# Patient Record
Sex: Female | Born: 1960 | Race: White | Hispanic: No | Smoking: Former smoker
Health system: Southern US, Community
[De-identification: ages and names within clinical notes are randomized; demographics above are authoritative.]

## PROBLEM LIST (undated history)

## (undated) DIAGNOSIS — I10 Essential (primary) hypertension: Secondary | ICD-10-CM

## (undated) DIAGNOSIS — F410 Panic disorder [episodic paroxysmal anxiety] without agoraphobia: Secondary | ICD-10-CM

## (undated) DIAGNOSIS — F419 Anxiety disorder, unspecified: Secondary | ICD-10-CM

## (undated) DIAGNOSIS — I2109 ST elevation (STEMI) myocardial infarction involving other coronary artery of anterior wall: Secondary | ICD-10-CM

## (undated) DIAGNOSIS — Z8673 Personal history of transient ischemic attack (TIA), and cerebral infarction without residual deficits: Secondary | ICD-10-CM

## (undated) DIAGNOSIS — M1990 Unspecified osteoarthritis, unspecified site: Secondary | ICD-10-CM

## (undated) DIAGNOSIS — I429 Cardiomyopathy, unspecified: Secondary | ICD-10-CM

## (undated) DIAGNOSIS — G43909 Migraine, unspecified, not intractable, without status migrainosus: Secondary | ICD-10-CM

## (undated) DIAGNOSIS — J449 Chronic obstructive pulmonary disease, unspecified: Secondary | ICD-10-CM

## (undated) DIAGNOSIS — I099 Rheumatic heart disease, unspecified: Secondary | ICD-10-CM

## (undated) DIAGNOSIS — G8929 Other chronic pain: Secondary | ICD-10-CM

## (undated) DIAGNOSIS — J45909 Unspecified asthma, uncomplicated: Secondary | ICD-10-CM

## (undated) DIAGNOSIS — M797 Fibromyalgia: Secondary | ICD-10-CM

## (undated) DIAGNOSIS — E785 Hyperlipidemia, unspecified: Secondary | ICD-10-CM

## (undated) DIAGNOSIS — Q059 Spina bifida, unspecified: Secondary | ICD-10-CM

## (undated) DIAGNOSIS — M549 Dorsalgia, unspecified: Secondary | ICD-10-CM

## (undated) HISTORY — DX: Hyperlipidemia, unspecified: E78.5

## (undated) HISTORY — DX: Essential (primary) hypertension: I10

## (undated) HISTORY — PX: CARDIAC CATHETERIZATION: SHX172

## (undated) HISTORY — DX: Rheumatic heart disease, unspecified: I09.9

## (undated) HISTORY — DX: ST elevation (STEMI) myocardial infarction involving other coronary artery of anterior wall: I21.09

## (undated) HISTORY — DX: Chronic obstructive pulmonary disease, unspecified: J44.9

## (undated) HISTORY — PX: APPENDECTOMY: SHX54

## (undated) HISTORY — DX: Personal history of transient ischemic attack (TIA), and cerebral infarction without residual deficits: Z86.73

## (undated) HISTORY — DX: Cardiomyopathy, unspecified: I42.9

## (undated) HISTORY — PX: TUBAL LIGATION: SHX77

---

## 2004-03-22 ENCOUNTER — Emergency Department (HOSPITAL_COMMUNITY): Admission: EM | Admit: 2004-03-22 | Discharge: 2004-03-22 | Payer: Self-pay | Admitting: Emergency Medicine

## 2005-07-01 ENCOUNTER — Ambulatory Visit: Payer: Self-pay | Admitting: Cardiology

## 2005-07-08 ENCOUNTER — Ambulatory Visit: Payer: Self-pay | Admitting: Cardiology

## 2005-07-09 ENCOUNTER — Inpatient Hospital Stay (HOSPITAL_BASED_OUTPATIENT_CLINIC_OR_DEPARTMENT_OTHER): Admission: RE | Admit: 2005-07-09 | Discharge: 2005-07-09 | Payer: Self-pay | Admitting: Cardiovascular Disease

## 2005-07-09 ENCOUNTER — Ambulatory Visit: Payer: Self-pay | Admitting: Cardiovascular Disease

## 2005-09-08 ENCOUNTER — Ambulatory Visit: Payer: Self-pay | Admitting: Cardiology

## 2005-12-05 ENCOUNTER — Ambulatory Visit: Payer: Self-pay | Admitting: Cardiology

## 2005-12-08 ENCOUNTER — Encounter: Payer: Self-pay | Admitting: Cardiology

## 2005-12-17 ENCOUNTER — Ambulatory Visit: Payer: Self-pay | Admitting: Cardiology

## 2005-12-23 ENCOUNTER — Encounter: Payer: Self-pay | Admitting: Cardiology

## 2005-12-23 ENCOUNTER — Ambulatory Visit: Payer: Self-pay | Admitting: Internal Medicine

## 2005-12-23 ENCOUNTER — Inpatient Hospital Stay (HOSPITAL_BASED_OUTPATIENT_CLINIC_OR_DEPARTMENT_OTHER): Admission: RE | Admit: 2005-12-23 | Discharge: 2005-12-23 | Payer: Self-pay | Admitting: Internal Medicine

## 2006-01-29 ENCOUNTER — Ambulatory Visit: Payer: Self-pay | Admitting: Cardiology

## 2006-02-09 ENCOUNTER — Ambulatory Visit: Payer: Self-pay | Admitting: Dentistry

## 2006-02-09 ENCOUNTER — Encounter: Admission: RE | Admit: 2006-02-09 | Discharge: 2006-02-09 | Payer: Self-pay | Admitting: Dentistry

## 2006-02-22 HISTORY — PX: MITRAL VALVE REPLACEMENT: SHX147

## 2006-02-26 ENCOUNTER — Ambulatory Visit (HOSPITAL_COMMUNITY): Admission: RE | Admit: 2006-02-26 | Discharge: 2006-02-26 | Payer: Self-pay | Admitting: Dentistry

## 2006-03-12 ENCOUNTER — Ambulatory Visit: Payer: Self-pay | Admitting: Dentistry

## 2006-03-16 ENCOUNTER — Encounter
Admission: RE | Admit: 2006-03-16 | Discharge: 2006-03-16 | Payer: Self-pay | Admitting: Thoracic Surgery (Cardiothoracic Vascular Surgery)

## 2006-03-18 ENCOUNTER — Encounter: Payer: Self-pay | Admitting: Physician Assistant

## 2006-03-18 ENCOUNTER — Inpatient Hospital Stay (HOSPITAL_COMMUNITY)
Admission: RE | Admit: 2006-03-18 | Discharge: 2006-03-26 | Payer: Self-pay | Admitting: Thoracic Surgery (Cardiothoracic Vascular Surgery)

## 2006-03-18 ENCOUNTER — Encounter (INDEPENDENT_AMBULATORY_CARE_PROVIDER_SITE_OTHER): Payer: Self-pay | Admitting: *Deleted

## 2006-03-21 IMAGING — CR DG CHEST 2V
2 series · 2 of 2 positions shown · non-contrast
Comparison: [DATE].

CLINICAL DATA: CABG.  
 CHEST ? 2 VIEW:

[w chest pa]
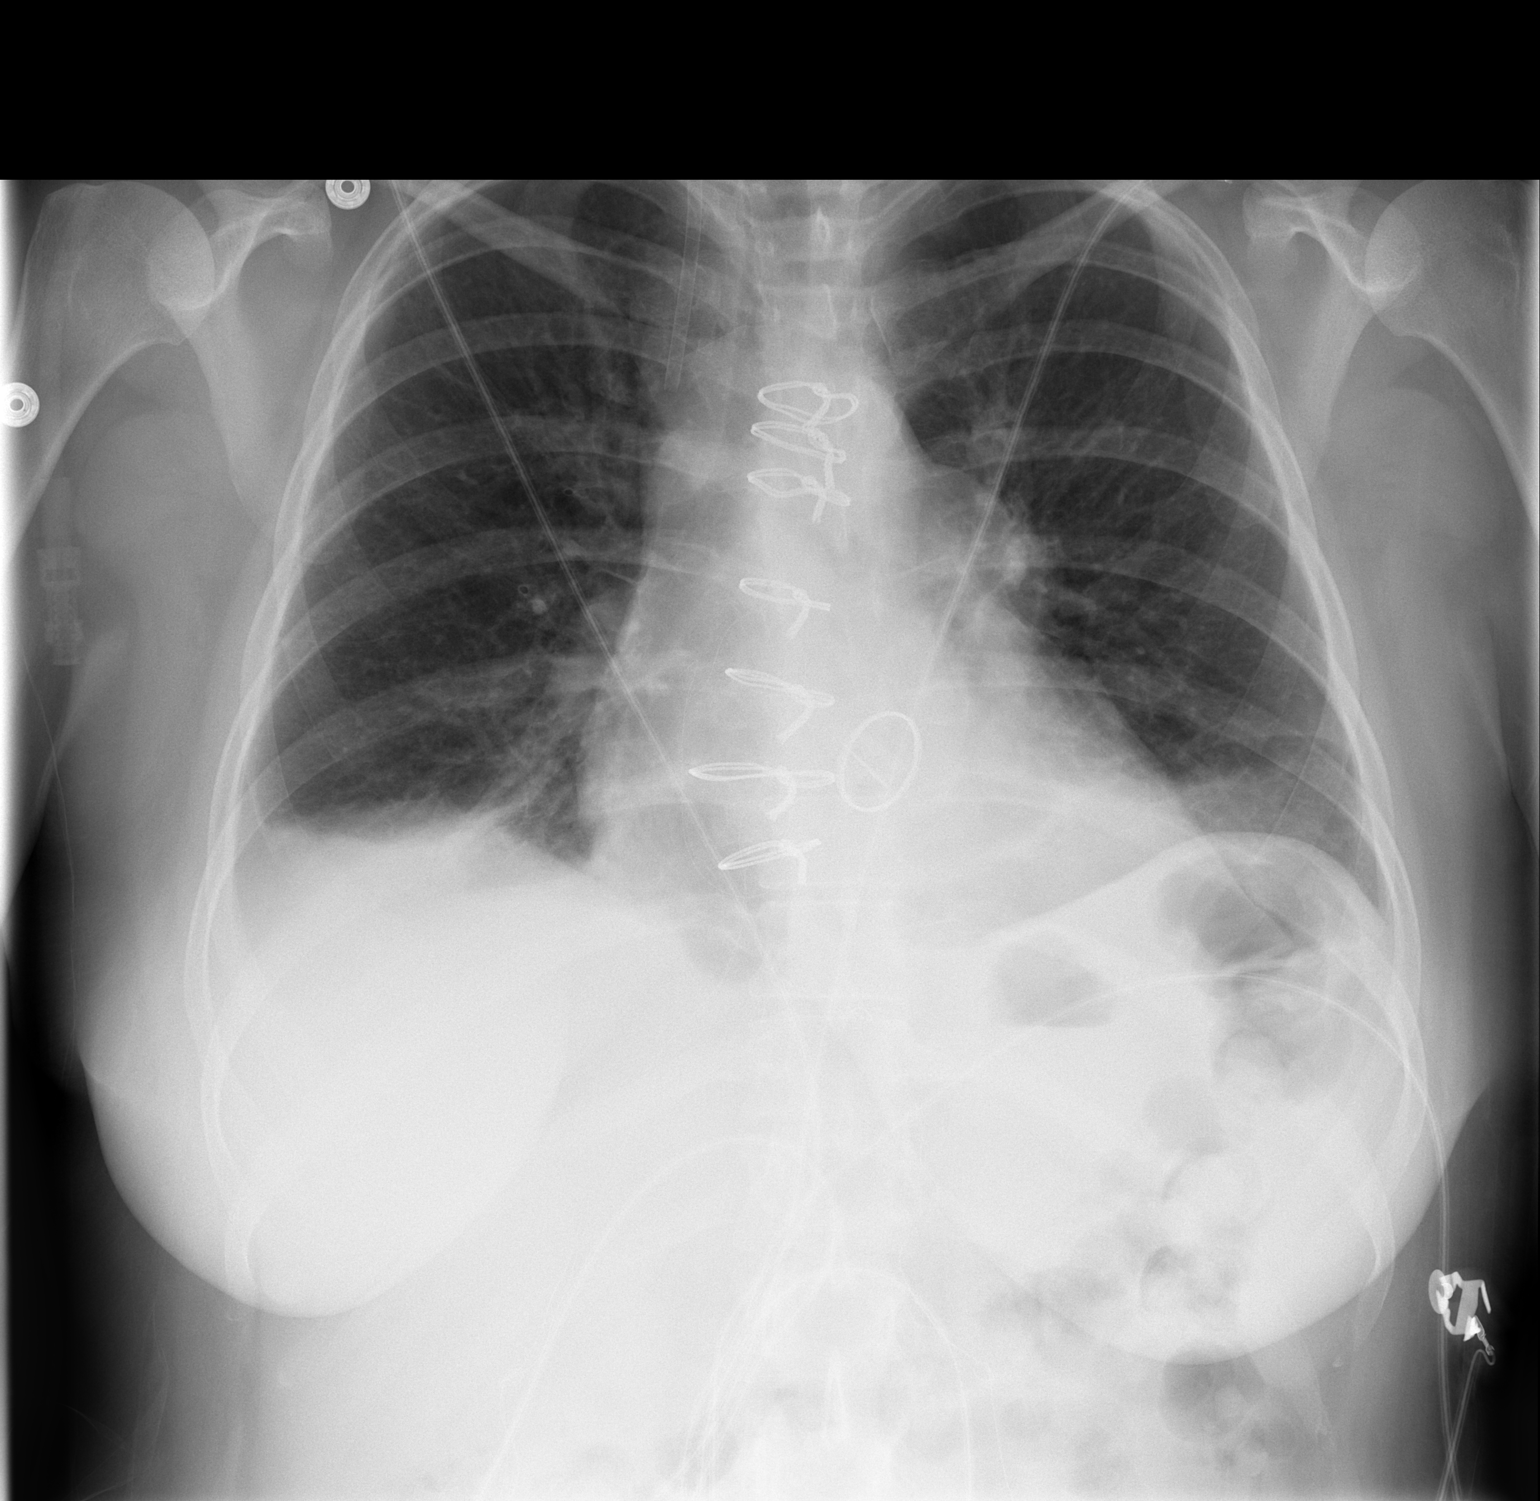

[w chest lat]
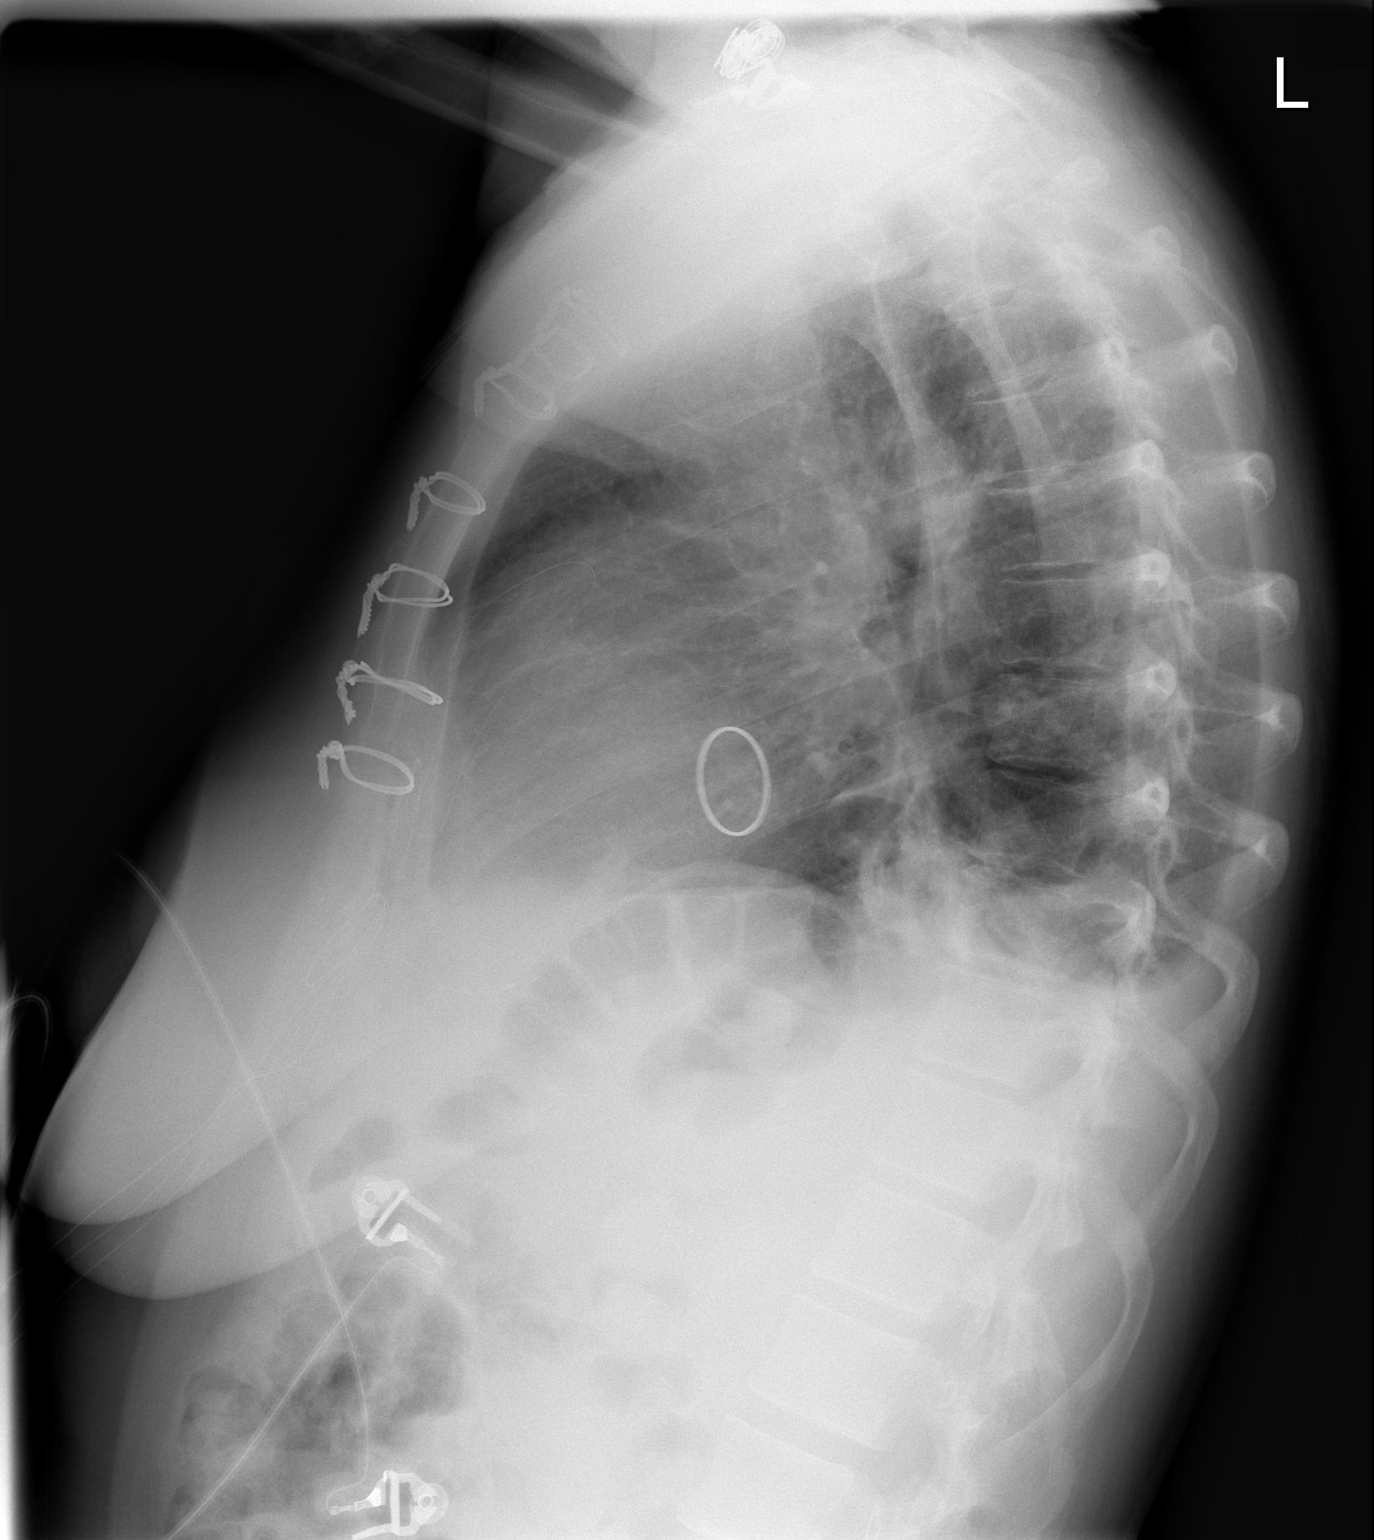

[2 of 2 positions shown; findings below may reference images not displayed]

FINDINGS: Left-sided chest tube or drain has been removed in the interval.  There is no evidence for pneumothorax.  Right IJ sheath remains in place.  Heart size is stable.  Decreased left base atelectasis with increased atelectasis at the right base.  Small bilateral pleural effusions.
IMPRESSION: Basilar atelectasis, improved on the left.  Small bilateral pleural effusions.

## 2006-04-02 ENCOUNTER — Ambulatory Visit: Payer: Self-pay | Admitting: Cardiology

## 2006-04-09 ENCOUNTER — Ambulatory Visit: Payer: Self-pay | Admitting: Cardiology

## 2006-04-13 ENCOUNTER — Encounter
Admission: RE | Admit: 2006-04-13 | Discharge: 2006-04-13 | Payer: Self-pay | Admitting: Thoracic Surgery (Cardiothoracic Vascular Surgery)

## 2006-05-21 ENCOUNTER — Ambulatory Visit: Payer: Self-pay | Admitting: Cardiology

## 2006-08-05 ENCOUNTER — Ambulatory Visit: Payer: Self-pay | Admitting: Dentistry

## 2006-08-19 ENCOUNTER — Ambulatory Visit: Payer: Self-pay | Admitting: Cardiology

## 2006-09-15 ENCOUNTER — Ambulatory Visit: Payer: Self-pay | Admitting: Dentistry

## 2007-01-29 ENCOUNTER — Ambulatory Visit: Payer: Self-pay | Admitting: Cardiology

## 2007-04-28 ENCOUNTER — Encounter: Payer: Self-pay | Admitting: Physician Assistant

## 2007-04-28 ENCOUNTER — Ambulatory Visit: Payer: Self-pay | Admitting: Cardiology

## 2007-04-29 ENCOUNTER — Encounter: Payer: Self-pay | Admitting: Cardiology

## 2007-05-30 ENCOUNTER — Encounter: Payer: Self-pay | Admitting: Physician Assistant

## 2007-08-29 ENCOUNTER — Ambulatory Visit: Payer: Self-pay | Admitting: Cardiology

## 2008-07-03 ENCOUNTER — Encounter
Admission: RE | Admit: 2008-07-03 | Discharge: 2008-07-03 | Payer: Self-pay | Admitting: Physical Medicine & Rehabilitation

## 2009-05-03 ENCOUNTER — Telehealth: Payer: Self-pay | Admitting: Cardiology

## 2009-11-24 DIAGNOSIS — I2109 ST elevation (STEMI) myocardial infarction involving other coronary artery of anterior wall: Secondary | ICD-10-CM

## 2009-11-24 HISTORY — DX: ST elevation (STEMI) myocardial infarction involving other coronary artery of anterior wall: I21.09

## 2010-06-24 HISTORY — PX: CORONARY ANGIOPLASTY WITH STENT PLACEMENT: SHX49

## 2010-07-04 ENCOUNTER — Ambulatory Visit: Payer: Self-pay | Admitting: Internal Medicine

## 2010-07-04 ENCOUNTER — Encounter: Payer: Self-pay | Admitting: Physician Assistant

## 2010-07-04 ENCOUNTER — Inpatient Hospital Stay (HOSPITAL_COMMUNITY): Admission: AD | Admit: 2010-07-04 | Discharge: 2010-07-10 | Payer: Self-pay | Admitting: Cardiology

## 2010-07-05 ENCOUNTER — Encounter: Payer: Self-pay | Admitting: Physician Assistant

## 2010-07-05 ENCOUNTER — Encounter (INDEPENDENT_AMBULATORY_CARE_PROVIDER_SITE_OTHER): Payer: Self-pay | Admitting: Internal Medicine

## 2010-07-06 ENCOUNTER — Encounter: Payer: Self-pay | Admitting: Physician Assistant

## 2010-07-07 ENCOUNTER — Encounter: Payer: Self-pay | Admitting: Physician Assistant

## 2010-07-10 ENCOUNTER — Encounter: Payer: Self-pay | Admitting: Physician Assistant

## 2010-07-12 ENCOUNTER — Encounter: Payer: Self-pay | Admitting: Cardiology

## 2010-07-12 ENCOUNTER — Telehealth (INDEPENDENT_AMBULATORY_CARE_PROVIDER_SITE_OTHER): Payer: Self-pay | Admitting: *Deleted

## 2010-07-24 ENCOUNTER — Encounter: Payer: Self-pay | Admitting: Physician Assistant

## 2010-07-24 ENCOUNTER — Encounter: Payer: Self-pay | Admitting: Cardiology

## 2010-08-27 ENCOUNTER — Ambulatory Visit: Payer: Self-pay | Admitting: Physician Assistant

## 2010-08-27 DIAGNOSIS — I5022 Chronic systolic (congestive) heart failure: Secondary | ICD-10-CM

## 2010-08-27 DIAGNOSIS — Z9889 Other specified postprocedural states: Secondary | ICD-10-CM | POA: Insufficient documentation

## 2010-08-27 DIAGNOSIS — E782 Mixed hyperlipidemia: Secondary | ICD-10-CM | POA: Insufficient documentation

## 2010-08-27 DIAGNOSIS — I2510 Atherosclerotic heart disease of native coronary artery without angina pectoris: Secondary | ICD-10-CM | POA: Insufficient documentation

## 2010-10-02 ENCOUNTER — Encounter (INDEPENDENT_AMBULATORY_CARE_PROVIDER_SITE_OTHER): Payer: Self-pay | Admitting: *Deleted

## 2010-10-08 ENCOUNTER — Encounter: Payer: Self-pay | Admitting: Cardiology

## 2010-10-24 ENCOUNTER — Encounter: Payer: Self-pay | Admitting: Physician Assistant

## 2010-10-29 ENCOUNTER — Encounter: Payer: Self-pay | Admitting: Physician Assistant

## 2010-11-01 ENCOUNTER — Encounter: Payer: Self-pay | Admitting: Physician Assistant

## 2010-12-15 ENCOUNTER — Encounter: Payer: Self-pay | Admitting: Neurosurgery

## 2010-12-24 NOTE — Letter (Signed)
Summary: Certified Letter for Pending Personal assistant at [facility]  [address]   [place], [place] [address]   Phone: [phone]  Fax: [phone]        October 02, 2010 MRN: [id number]    [facility] [address] [place], [place]  [address]    Dear Ms. Belair,     You are scheduled for an echocardiogram on October 6th. You also had lab work ordered following your office visit on October 4th. We mailed you a letter on October 21st asking you to contact the office to reschedule your echocardiogram and to remind you to have lab work done. However, it does not appear the echo or labs have been done at this time.   Please contact our office as soon as possible to reschedule the echocardiogram. You may go to the [facility] for lab work or do this at the hospital on the same day as your echo when you get it rescheduled.   If you will not be able to do the echocardiogram or lab work at this time, please notify our office so that we can properly document this in  your chart.        Sincerely,  Cyril Loosen, RN, BSN  This letter has been electronically signed by your physician.

## 2010-12-24 NOTE — Letter (Signed)
Summary: Appointment -missed  [facility] [address]   [place], [place] [address]   Phone: [phone]  Fax: [phone]     July 24, 2010 MRN: [id number]     [facility] [address] [place], [place]  [address]     Dear Ms. Daphine Deutscher,  Our records indicate you missed your appointment on July 24, 2010                        with Dr. Andee Lineman.   It is very important that we reach you to reschedule this appointment. We look forward to participating in your health care needs.   Please contact us at the number listed above at your earliest convenience to reschedule this appointment.   Sincerely,    Glass blower/designer

## 2010-12-24 NOTE — Medication Information (Signed)
Summary: RX Folder/ CRESTOR PRIOR AUTHORIZATION  RX Folder/ CRESTOR PRIOR AUTHORIZATION   Imported By: Dorise Hiss 10/08/2010 14:18:41  _____________________________________________________________________  External Attachment:    Type:   Image     Comment:   External Document

## 2010-12-24 NOTE — Assessment & Plan Note (Signed)
Summary: EPH-POST CONE NO SHOWED FOR LAST APPT   Visit Type:  Follow-up Primary Provider:  Dhruv Vyas,MD   History of Present Illness: patient presents for post hospital followup.  She presented with acute anterior ST elevation MI, on August 11, at [facility], and was discharged on 8/17. She was found to have 100% mid LAD, successfully treated with BMS, but also suffered catheter-induced left mainstem dissection, also treated with BMS. She had residual nonobstructive CFX and RCA disease. LV function was severely depressed at (25-30%), with apical akinesis.  She also required treatment for acute systolic heart failure, and was discharged on furosemide and Aldactone. She has not had any post hospital followup labs, for monitoring of electrolytes and renal function. She was placed back on Coumadin, for history of St Jude prosthetic MVR, and is followed by Dr. Sherril Croon.  Of note, patient was discharged with a LifeVest, but does not wear this continuously. Plan was for a followup 2-D echo, 40 days post discharge, for reassessment of LVF.  She denies any recurrent chest pain, significant dyspnea, orthopnea, PND, lower extremity edema, tachypalpitations, or presyncope/syncope. She reports compliance with all her medications. Unfortunately, however, she continues to smoke.  Preventive Screening-Counseling & Management  Alcohol-Tobacco     Smoking Status: current     Smoking Cessation Counseling: yes     Packs/Day: <1/2 PPD  Current Medications (verified): 1)  Aspir-Low 81 Mg Tbec (Aspirin) .... Take 1 Tablet By Mouth Once A Day 2)  Carvedilol 6.25 Mg Tabs (Carvedilol) .... Take 1 Tablet By Mouth Two Times A Day 3)  Plavix 75 Mg Tabs (Clopidogrel Bisulfate) .... Take 1 Tablet By Mouth Once A Day 4)  Digoxin 0.125 Mg Tabs (Digoxin) .... Take 1 Tablet By Mouth Once A Day 5)  Enalapril Maleate 2.5 Mg Tabs (Enalapril Maleate) .... Take 1 Tablet By Mouth Two Times A Day 6)  Furosemide 40 Mg  Tabs (Furosemide) .... Take 1 Tablet By Mouth Once A Day 7)  Nitrostat 0.4 Mg Subl (Nitroglycerin) .... Use As Directed For Chest Pain As Needed 8)  Crestor 40 Mg Tabs (Rosuvastatin Calcium) .... Take 1 Tablet By Mouth Once A Day 9)  Spironolactone 25 Mg Tabs (Spironolactone) .... Take 1 Tablet By Mouth Once A Day 10)  Warfarin Sodium 5 Mg Tabs (Warfarin Sodium) .... Use As Directed Per Vyas Office 11)  Hydrocodone-Acetaminophen 5-500 Mg Tabs (Hydrocodone-Acetaminophen) .... Take 1 Tablet By Mouth Two Times A Day As Needed 12)  Alprazolam 0.5 Mg Tabs (Alprazolam) .... Take 1 Tablet By Mouth Two Times A Day As Needed  Allergies (verified): 1)  ! Flexeril  Comments:  Nurse/Medical Assistant: The patient's medication bottles and allergies were reviewed with the patient and were updated in the Medication and Allergy Lists.  Past History:  Past Medical History: Last updated: 07/24/2010  1. Rheumatic heart disease, status post St. Jude mitral valve in 2007.   2. Minimal nonobstructive coronary disease by cardiac catheterization       prior to her surgery in 2007, ejection fraction of 55% at that       time.   3. COPD.   4. Hypertension.   5. History of congestive heart failure.     Social History: Smoking Status:  current Packs/Day:  <1/2 PPD  Review of Systems       No fevers, chills, hemoptysis, dysphagia, melena, hematocheezia, hematuria, rash, claudication, orthopnea, pnd, pedal edema. All other systems negative.   Vital Signs:  Patient profile:  50 year old female Height:      64 inches Weight:      196 pounds BMI:     33.76 Pulse rate:   67 / minute BP sitting:   97 / 64  (left arm) Cuff size:   large  Vitals Entered By: Carlye Grippe (August 27, 2010 10:46 AM)  Nutrition Counseling: Patient's BMI is greater than 25 and therefore counseled on weight management options.  Physical Exam  Additional Exam:  GEN: 49 -year-oldl female, sitting up right, no  distress HEENT: NCAT,PERRLA,EOMI NECK: palpable pulses, no bruits; no JVD; no TM LUNGS: CTA bilaterally HEART: RRR (S1S2); no significant murmurs; crisp S1 click ABD: soft, NT; intact BS EXT: intact distal pulses; no edema SKIN: warm, dry MUSC: no obvious deformity NEURO: A/O (x3)     Impression & Recommendations:  Problem # 1:  CAD (ICD-414.00)  Will schedule 2-D echo for reassessment of severe LVD (25-30%). If this remains less than 35%, we'll refer her to our EP team for consideration of ICD placement. Patient is otherwise advised to continue wearing Lifevest, in the interim.  Problem # 2:  CHRONIC SYSTOLIC HEART FAILURE (ICD-428.22)  appears euvolemic by history and presentation. We'll check metabolic profile for monitoring of electrolytes renal function, as well as CBC.  Problem # 3:  MITRAL VALVE REPLACEMENT, HX OF (ICD-V15.1)  patient is on chronic Coumadin, followed by Dr. Sherril Croon.  Problem # 4:  HYPERLIPIDEMIA, MIXED (ICD-272.2)  aggressive lipid management recommended, with target LDL 70 or less, if feasible.  Other Orders: T-Basic Metabolic Panel [phone]) T-CBC No Diff ([id number]) 2-D Echocardiogram (2D Echo)  Patient Instructions: 1)  Your physician wants you to follow-up in: 3 months. You will receive a reminder letter in the mail one-two months in advance. If you don't receive a letter, please call our office to schedule the follow-up appointment. 2)  Your physician recommends that you go to the [facility] for lab work. 3)  Your physician has requested that you have an echocardiogram.  Echocardiography is a painless test that uses sound waves to create images of your heart. It provides your doctor with information about the size and shape of your heart and how well your heart's chambers and valves are working.  This procedure takes approximately one hour. There are no restrictions for this procedure. 4)  No added salt diet.

## 2010-12-24 NOTE — Progress Notes (Signed)
Summary: crestor pre-authorization   Phone Note Other Incoming   Caller: fax from [facility] for prior authorization Summary of Call: Requesting that we do prior authorization on this patient for Crestor.  We have not seen her in this office since '08, but you are seeing her on 8/31 for post hosp.  Recently had cath on 8/17 & was started by Dr. Sanjuana Kava.  Patient has Medicaid, so this med requires pre-auth.  Do you want to change to different statin?  [facility] aware sending message to PA.   Hoover Brunette, LPN  July 12, 2010 10:58 AM   Follow-up for Phone Call        no, continue Crestor Follow-up by: Nelida Meuse, PA-C,  July 17, 2010 1:12 PM

## 2010-12-24 NOTE — Letter (Signed)
Summary: Letter/ REORDER LIFEVEST  Letter/ REORDER LIFEVEST   Imported By: Dorise Hiss 10/09/2010 09:23:46  _____________________________________________________________________  External Attachment:    Type:   Image     Comment:   External Document

## 2010-12-24 NOTE — Op Note (Signed)
Summary: Operative Report  Operative Report   Imported By: Dorise Hiss 07/24/2010 12:13:15  _____________________________________________________________________  External Attachment:    Type:   Image     Comment:   External Document

## 2010-12-24 NOTE — Cardiovascular Report (Signed)
Summary: Cardiac Catheterization  Cardiac Catheterization   Imported By: Dorise Hiss 07/22/2010 12:40:38  _____________________________________________________________________  External Attachment:    Type:   Image     Comment:   External Document

## 2010-12-24 NOTE — Letter (Signed)
Summary: Appointment -missed  [facility] [address]   [place], [place] [address]   Phone: [phone]  Fax: [phone]     July 12, 2010 MRN: [id number]     [facility] [address] [place], [place]  [address]     Dear Ms. Daphine Deutscher,  Our records indicate you missed your appointment on July 12, 2010                        with [facility].   It is very important that we reach you to reschedule this appointment. We look forward to participating in your health care needs.   Please contact us at the number listed above at your earliest convenience to reschedule this appointment.   Sincerely,    Glass blower/designer

## 2010-12-24 NOTE — Miscellaneous (Signed)
Summary: Orders Update  Clinical Lists Changes  Orders: Added new Referral order of 2-D Echocardiogram (2D Echo) - Signed 

## 2010-12-24 NOTE — Medication Information (Signed)
Summary: RX Folder/ CRESTOR DISCUSS AT OV  RX Folder/ CRESTOR DISCUSS AT OV   Imported By: Dorise Hiss 07/24/2010 12:03:13  _____________________________________________________________________  External Attachment:    Type:   Image     Comment:   External Document

## 2010-12-26 NOTE — Letter (Signed)
Summary: Certified Letter Response  Certified Letter Response   Imported By: Cyril Loosen, RN, BSN 11/22/2010 09:21:50  _____________________________________________________________________  External Attachment:    Type:   Image     Comment:   External Document

## 2011-02-07 LAB — CBC
HCT: 38.4 % (ref 36.0–46.0)
HCT: 40.8 % (ref 36.0–46.0)
HCT: 41.3 % (ref 36.0–46.0)
Hemoglobin: 12.8 g/dL (ref 12.0–15.0)
Hemoglobin: 13.1 g/dL (ref 12.0–15.0)
Hemoglobin: 13.7 g/dL (ref 12.0–15.0)
MCH: 34.2 pg — ABNORMAL HIGH (ref 26.0–34.0)
MCHC: 33.6 g/dL (ref 30.0–36.0)
MCHC: 33.8 g/dL (ref 30.0–36.0)
MCHC: 33.9 g/dL (ref 30.0–36.0)
MCV: 100.5 fL — ABNORMAL HIGH (ref 78.0–100.0)
MCV: 99.5 fL (ref 78.0–100.0)
MCV: 99.7 fL (ref 78.0–100.0)
Platelets: 236 10*3/uL (ref 150–400)
Platelets: 257 10*3/uL (ref 150–400)
RBC: 3.85 MIL/uL — ABNORMAL LOW (ref 3.87–5.11)
RBC: 4.15 MIL/uL (ref 3.87–5.11)
RBC: 4.18 MIL/uL (ref 3.87–5.11)
RDW: 13.7 % (ref 11.5–15.5)
WBC: 12.1 10*3/uL — ABNORMAL HIGH (ref 4.0–10.5)
WBC: 12.2 10*3/uL — ABNORMAL HIGH (ref 4.0–10.5)
WBC: 14.1 10*3/uL — ABNORMAL HIGH (ref 4.0–10.5)

## 2011-02-07 LAB — BASIC METABOLIC PANEL
BUN: 16 mg/dL (ref 6–23)
BUN: 19 mg/dL (ref 6–23)
BUN: 8 mg/dL (ref 6–23)
BUN: 9 mg/dL (ref 6–23)
CO2: 25 mEq/L (ref 19–32)
CO2: 26 mEq/L (ref 19–32)
CO2: 30 mEq/L (ref 19–32)
Calcium: 8.1 mg/dL — ABNORMAL LOW (ref 8.4–10.5)
Calcium: 8.4 mg/dL (ref 8.4–10.5)
Calcium: 8.8 mg/dL (ref 8.4–10.5)
Chloride: 105 mEq/L (ref 96–112)
Chloride: 94 mEq/L — ABNORMAL LOW (ref 96–112)
Chloride: 96 mEq/L (ref 96–112)
Chloride: 98 mEq/L (ref 96–112)
Creatinine, Ser: 0.83 mg/dL (ref 0.4–1.2)
Creatinine, Ser: 0.96 mg/dL (ref 0.4–1.2)
Creatinine, Ser: 1.19 mg/dL (ref 0.4–1.2)
GFR calc Af Amer: 59 mL/min — ABNORMAL LOW (ref >60)
GFR calc Af Amer: >60 mL/min (ref >60)
GFR calc Af Amer: >60 mL/min (ref >60)
GFR calc non Af Amer: 48 mL/min — ABNORMAL LOW (ref >60)
GFR calc non Af Amer: 49 mL/min — ABNORMAL LOW (ref >60)
GFR calc non Af Amer: >60 mL/min (ref >60)
GFR calc non Af Amer: >60 mL/min (ref >60)
GFR calc non Af Amer: >60 mL/min (ref >60)
Glucose, Bld: 111 mg/dL — ABNORMAL HIGH (ref 70–99)
Glucose, Bld: 124 mg/dL — ABNORMAL HIGH (ref 70–99)
Glucose, Bld: 127 mg/dL — ABNORMAL HIGH (ref 70–99)
Glucose, Bld: 138 mg/dL — ABNORMAL HIGH (ref 70–99)
Potassium: 3 mEq/L — ABNORMAL LOW (ref 3.5–5.1)
Potassium: 3.8 mEq/L (ref 3.5–5.1)
Potassium: 3.8 mEq/L (ref 3.5–5.1)
Potassium: 4 mEq/L (ref 3.5–5.1)
Potassium: 4.4 mEq/L (ref 3.5–5.1)
Sodium: 137 mEq/L (ref 135–145)
Sodium: 137 mEq/L (ref 135–145)
Sodium: 138 mEq/L (ref 135–145)
Sodium: 138 mEq/L (ref 135–145)
Sodium: 139 mEq/L (ref 135–145)

## 2011-02-07 LAB — COMPREHENSIVE METABOLIC PANEL
ALT: 24 U/L (ref 0–35)
Alkaline Phosphatase: 53 U/L (ref 39–117)
BUN: 17 mg/dL (ref 6–23)
CO2: 30 mEq/L (ref 19–32)
Chloride: 102 mEq/L (ref 96–112)
GFR calc non Af Amer: 57 mL/min — ABNORMAL LOW (ref >60)
Glucose, Bld: 107 mg/dL — ABNORMAL HIGH (ref 70–99)
Potassium: 4.4 mEq/L (ref 3.5–5.1)
Sodium: 139 mEq/L (ref 135–145)
Total Bilirubin: 0.4 mg/dL (ref 0.3–1.2)
Total Protein: 6.8 g/dL (ref 6.0–8.3)

## 2011-02-07 LAB — LIPID PANEL
Cholesterol: 269 mg/dL — ABNORMAL HIGH (ref 0–200)
HDL: 39 mg/dL — ABNORMAL LOW (ref >39)
LDL Cholesterol: 181 mg/dL — ABNORMAL HIGH (ref 0–99)
Total CHOL/HDL Ratio: 6.9 RATIO
VLDL: 49 mg/dL — ABNORMAL HIGH (ref 0–40)

## 2011-02-07 LAB — HEPARIN LEVEL (UNFRACTIONATED)
Heparin Unfractionated: 0.29 IU/mL — ABNORMAL LOW (ref 0.30–0.70)
Heparin Unfractionated: 0.43 IU/mL (ref 0.30–0.70)
Heparin Unfractionated: 0.45 IU/mL (ref 0.30–0.70)
Heparin Unfractionated: <0.1 IU/mL — ABNORMAL LOW (ref 0.30–0.70)

## 2011-02-07 LAB — PROTIME-INR
INR: 1.29 (ref 0.00–1.49)
INR: 1.36 (ref 0.00–1.49)
INR: 1.56 — ABNORMAL HIGH (ref 0.00–1.49)
INR: 1.89 — ABNORMAL HIGH (ref 0.00–1.49)
Prothrombin Time: 17 seconds — ABNORMAL HIGH (ref 11.6–15.2)
Prothrombin Time: 26.3 seconds — ABNORMAL HIGH (ref 11.6–15.2)

## 2011-02-07 LAB — CARDIAC PANEL(CRET KIN+CKTOT+MB+TROPI)
CK, MB: 187.9 ng/mL (ref 0.3–4.0)
Relative Index: 10.9 — ABNORMAL HIGH (ref 0.0–2.5)
Relative Index: 4.7 — ABNORMAL HIGH (ref 0.0–2.5)
Total CK: 3021 U/L — ABNORMAL HIGH (ref 7–177)
Total CK: 3382 U/L — ABNORMAL HIGH (ref 7–177)
Total CK: 4026 U/L — ABNORMAL HIGH (ref 7–177)
Troponin I: 19.66 ng/mL (ref 0.00–0.06)
Troponin I: 42.74 ng/mL (ref 0.00–0.06)
Troponin I: 83.77 ng/mL (ref 0.00–0.06)

## 2011-02-07 LAB — HEMOGLOBIN A1C
Hgb A1c MFr Bld: 6.1 % — ABNORMAL HIGH (ref <5.7)
Mean Plasma Glucose: 128 mg/dL — ABNORMAL HIGH (ref <117)

## 2011-02-07 LAB — BRAIN NATRIURETIC PEPTIDE
Pro B Natriuretic peptide (BNP): 324 pg/mL — ABNORMAL HIGH (ref 0.0–100.0)
Pro B Natriuretic peptide (BNP): 506 pg/mL — ABNORMAL HIGH (ref 0.0–100.0)

## 2011-04-11 NOTE — Cardiovascular Report (Signed)
NAMEFREDRICK, DRAY NO.:  [id number]   MEDICAL RECORD NO.:  [id number]          PATIENT TYPE:  OIB   LOCATION:  1966                         FACILITY:  [facility]   PHYSICIAN:  Arvilla Meres, M.D. LHCDATE OF BIRTH:  19-Nov-1961   DATE OF PROCEDURE:  DATE OF DISCHARGE:  12/23/2005                              CARDIAC CATHETERIZATION   PRIMARY CARE PHYSICIAN:  Dr. Janora Norlander in [place], [place].   CARDIOLOGIST:  Dr. Lewayne Bunting.   IDENTIFICATION:  Ms. Luzader is a 50 year old smoker with recently discovered  severe mitral valve disease.  She is referred for a diagnostic right and  left heart catheterization for pre-op evaluation.   PROCEDURES PERFORMED:  1.  Selective coronary angiography.  2.  Right heart cath with Fick cardiac output.  3.  Left heart cath.  4.  Left ventriculogram.   DESCRIPTION OF PROCEDURE:  Risks and benefits of catheterization were  explained Ms. [place], consent was signed and placed on the chart.  A 4-  [place] arterial sheath was placed in the right femoral artery using a  modified Seldinger technique.  Standard preformed Judkins JL-4, JR-4, and  angled pigtail catheters were used for procedure, all catheter exchanges  made over wire.  There were no apparent complications.  A 7-French venous  sheath was placed in the right femoral vein and a standard right heart cath  was performed with a Swan-Ganz catheter.   FINDINGS:   HEMODYNAMIC RESULTS:  1.  Central aortic pressure 104/66 with mean of 83.  2.  LV pressure is 104/2 with an EDP of 10.  3.  RA pressure was 7.  4.  RV pressure is 88/44.  5.  PA pressure is 87/37 with a mean of 57.  6.  A right capillary wedge pressure was mean of 29 with the V-wave of 35.  7.  Left pulmonary capillary wedge pressure was mean of 43 with a V-wave of      50.  These were both checked twice.  8.  Fick cardiac output was 3.0 l/min.  Fick cardiac index of 1.8 l/min/m2.  9.  There is no aortic  stenosis on pullback.  10. Evaluating the mitral valve on a simultaneous wedge and LV pressure,      using the left wedge, gave [place] mean gradient 32 which calculated mitral      valve area 0.42-cm2.  11. Using a right primary capillary wedge pressure, we got a mean of 20 with      a mitral valve area 0.60-cm2.  12. Point vascular resistance, using the right wedge was 6 Woods units,      point of vascular assistance using the left wedge was 4.7 Woods units.   CORONARY ANATOMY:  1.  The left main was normal.  2.  LAD was a long vessel wrapping the apex.  It gave off two moderate sized      diagonals.  There is a 30% stenosis in the LAD at the takeoff of the      first diagonal.  3.  The left circ was a very small vessel.  It gave off tiny OM-1 and the      distal AV groove circumflex was small.  There is no significant      angiographic CAD.  4.  Right coronary artery was a dominant vessel.  It gave off a PDA and two      PLs.  There was some catheter induced spasm in the ostium and a 30%      lesion in the midsection, otherwise no significant atherosclerosis.  5.  Left ventriculogram, done in the RAO position, showed an EF of 65% with      normal wall motion.  We were unable to assess the degree of mitral      regurgitation due to significant ectopy.   ASSESSMENT:  1.  Minimal nonobstructive coronary disease.  2.  Normal left ventricle function.  3.  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  4.  Severe mitral stenosis.  5.  Severe mitral regurgitation by echocardiogram.   PLAN/DISCUSSION:  Although Ms. Coor wedge pressures are a bit discordant  between the right and left, she obviously has severe mitral stenosis as well  as a significant degree of intrinsic lung disease.  At this point, we will  refer her to CVTS for a mitral valve  replacement.  She will also need  aggressive risk factor management including the need to stop smoking.  She  will need PFTs to assess her degree of the lung disease.  I suspect some of  her dyspnea with persist even after mitral valve replacement.      Arvilla Meres, M.D. [facility]  Electronically Signed     DB/MEDQ  D:  12/23/2005  T:  12/23/2005  Job:  [id number]   cc:   Salvatore Decent. Cornelius Moras, M.D.  [address]  [place]  [place] [address]   Janora Norlander, Dr.  [place], [place]

## 2011-04-11 NOTE — Op Note (Signed)
NAMEANIVEA, [name]              ACCOUNT NO.:  [id number]   MEDICAL RECORD NO.:  [id number]          PATIENT TYPE:  INP   LOCATION:  2307                         FACILITY:  [facility]   PHYSICIAN:  Salvatore Decent. Cornelius Moras, M.D. DATE OF BIRTH:  [birth year]   DATE OF PROCEDURE:  03/18/2006  DATE OF DISCHARGE:                                 OPERATIVE REPORT   PREOPERATIVE DIAGNOSIS:  Severe mitral regurgitation and mitral stenosis.   POSTOPERATIVE DIAGNOSIS:  Severe mitral regurgitation and mitral stenosis.   OPERATION PERFORMED:  Median sternotomy for mitral valve replacement (25 mm  St. Jude mechanical prosthesis).   SURGEON:  Salvatore Decent. Cornelius Moras, M.D.   ASSISTANT:  Theda Belfast, PA   ANESTHESIA:  General.   INDICATIONS FOR PROCEDURE:  The patient is a 50 year old female with history  of recurrent episodes of congestive heart failure and pulmonary hypertension  referred by Learta Codding, M.D. 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  The  patient has undergone transesophageal echocardiogram as well as left and  right heart catheterizations confirming the presence of severe mitral  stenosis, severe mitral regurgitation, and severe pulmonary hypertension.  The patient has no significant coronary artery disease and left ventricular  systolic function is normal.  A full consultation note has been dictated  previously.  The patient has been counseled at length regarding the  indications and potential benefits of mitral valve repair or replacement.  The patient understands the high likelihood that the mitral valve will not  be repairable and that valve replacement will be necessary.  Under the  circumstances, a mechanical prosthesis has been recommended to decrease the  risk of need for recurrent surgical intervention in the suture.  The patient  understands that use of a mechanical prosthesis will have associated it with  some risk of thromboembolism and need for lifelong anticoagulation with  Coumadin.  The patient understands and accepts all associated risks of  surgery including but not limited to risks of death, stroke, myocardial  infarction, congestive heart failure, respiratory failure, pneumonia,  bleeding requiring blood transfusion, arrhythmia, heart block or bradycardia  requiring permanent pacemaker, late complications related to valve repair or  valve replacement.  The patient understands that the tricuspid valve will  also be evaluated at the time of surgery and tricuspid valve annuloplasty  might be indicated depending on the severity of tricuspid regurgitation.  All of her questions have been addressed.   OPERATIVE FINDINGS:  1.  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  2.  Normal left ventricular systolic function.  3.  Severe pulmonary hypertension.  4.  Trace tricuspid regurgitation.   DESCRIPTION OF PROCEDURE:  The patient was brought to the operating room on  the above mentioned date and central monitoring was established by the  anesthesia service under the care and direction of W. Autumn Patty, MD.  Specifically, a Swan-Ganz catheter was placed through the right internal  jugular approach.  A radial arterial line was placed.  Initial pulmonary  artery pressures are between 90 and 100 mmHg systolic with  systemic arterial  pressures simultaneously only slightly higher, 120 mmHg systolic.  The  patient was placed in the supine position on the operating table.  Intravenous antibiotics were administered.  Following induction with general  endotracheal anesthesia, a Foley catheter was placed.  The patient's chest,  abdomen, both groins, and both lower extremities were prepared and draped in  sterile manner.   Baseline transesophageal echocardiogram was performed by Dr. Sampson Goon.  This demonstrates a mitral valve that is severely thickened with severe   thickening and foreshortening of the subvalvular apparatus as well as mitral  stenosis and mitral regurgitation.  These findings are all consistent with  rheumatic heart disease.  There is normal left ventricular systolic function  with normal left ventricular size.  The aortic valve appears normal.  The  tricuspid valve appears normal with only mild tricuspid regurgitation.  The  interatrial septum appears intact with no sign of communication.  No other  abnormalities of significance are noted.   A median sternotomy incision is performed.  The pericardium is opened.  The  ascending aorta is normal in appearance.  There is mild biventricular  enlargement of the heart.  The patient is heparinized systemically.  The  ascending aorta is cannulated for cardiopulmonary bypass.  Venous cannula is  placed directly in the superior vena cava. The second venous cannula is  placed low in the right atrium with tip extending down the inferior vena  cava.  A retrograde cardioplegia catheter is placed through the right atrium  into the coronary sinus.   Cardiopulmonary bypass is begun.  Vessel loops are placed around the  superior vena cava and the inferior vena cava.  Cardioplegia catheter was  placed in the ascending aorta.  A temperature probe was placed in the left  ventricular septum.  The patient is cooled to 32 degrees systemic  temperature.  The aortic cross-clamp was applied and cold blood cardioplegia  is administered initially in antegrade fashion through the aortic root.  Iced saline slush is applied for topical hypothermia.  Supplemental  cardioplegia is administered retrograde through the coronary sinus catheter.  Repeat doses of cardioplegia are administered intermittently throughout the  cross-clamp portion of the operation retrograde through the coronary sinus  catheter to maintain left ventricular septal temperature below 15 degrees  centigrade.  A left atriotomy incision was  performed posteriorly through the interatrial  groove.  The mitral valve was exposed using the self-retaining retractor.  Exposure was felt to be satisfactory.  The mitral valve was carefully  examined.  There was obvious evidence of rheumatic heart disease with severe  mitral stenosis and severe thickening of both the anterior and posterior  leaflets of the mitral valve.  The subvalvular apparatus is completely fused  and severely foreshortened with calcification of both papillary muscles and  complete fusion of all the chordae tendinea.  The mitral valve was excised  sharply.  There are no portions of the subvalvular apparatus which are  normal enough to justify an attempt at preservation of any chordae tendinea  at all.  Mitral valve replacement is performed using interrupted 2-0  Ethibond horizontal mattress pledgeted sutures with pledgets in the supra-  annular position.  The mitral valve annulus is sized to accept a 25 mm  mechanical prosthesis.  The mitral valve is replaced using St. Jude  mechanical prosthesis (model number [id number], serial number [id number]).  The  valve was secured in place uneventfully.  After completion of valve  replacement, the leaflets are carefully  examined to make sure they open and  close appropriately.   Rewarming is begun.  The left atrial appendage is oversewn from within the  left atrium using a two layered closure of running 3-0 Prolene suture.  The  left atriotomy incision is now closed using a two-layered closure with  running 3-0 Prolene suture.  The patient was placed in Trendelenburg  position.  One final dose of warm retrograde hot shot cardioplegia was  administered.  The lungs were ventilated and the heart allowed to fill to  evacuate any residual air through the aortic root.  The aortic cross-clamp  was removed after a total cross-clamp time of 67 minutes.   The heart began to beat spontaneously without need for cardioversion.  Meticulous  surgical hemostasis was ascertained.  Epicardial pacing wires  were fixed to the right ventricular free wall and to the right atrial  appendage.  AV sequential pacing is employed.  The IVC cannula is removed  and its cannulation site oversewn with Prolene sutures.  The patient was  weaned from cardiopulmonary bypass without difficulty.  The patient's rhythm  at separation from bypass was a junctional rhythm.  AV sequential pacing was  employed.  Normal sinus rhythm resumed spontaneously prior to completion of  the operation and pacing was discontinued.  Total cardiopulmonary bypass  time for the operation was 86 minutes.  The patient was weaned from bypass  on low dose milrinone.   The SVC cannula was removed uneventfully and its cannulation site oversewn  with Prolene suture.  The aortic root vent was removed.  Followup  transesophageal echocardiogram performed by Dr. Sampson Goon demonstrates a normal functioning mechanical prosthesis in the mitral position with no sign  of perivalvular leak.  Left ventricular function appears normal.  No other  significant abnormalities are noted.  There is no significant residual air.   The aortic cannula was removed uneventfully.  Protamine was administered to  reverse the anticoagulation.  The mediastinum was irrigated with saline  solution containing vancomycin.  Meticulous surgical hemostasis was  ascertained.  The mediastinum was drained using two chest tubes exited  through separate stab incisions inferiorly.  The sternotomy was closed using  double strength sternal wire after reapproximating the pericardium loosely  over the ascending aorta and right ventricular outflow tract.  The soft  tissues anterior to the sternum are closed in multiple layers and the skin  is closed with running subcuticular skin closure.   The patient tolerated the procedure well and is transported to the surgical  intensive care unit in stable condition.  There are no  intraoperative  complications.  All sponge, needle and instrument counts are verified  correct at completion of the operation.  No blood products were  administered.      Salvatore Decent. Cornelius Moras, M.D.  Electronically Signed     CHO/MEDQ  D:  03/18/2006  T:  03/19/2006  Job:  [id number]   cc:   Learta Codding, M.D. [facility]  [address]  [address]  [place]  [place] [address]   Selinda Flavin  Fax: [phone]

## 2011-04-11 NOTE — Cardiovascular Report (Signed)
NAMEDAVONNE, BABY              ACCOUNT NO.:  [id number]   MEDICAL RECORD NO.:  [id number]          PATIENT TYPE:  OIB   LOCATION:  6501                         FACILITY:  [facility]   PHYSICIAN:  Charlton Haws, M.D.     DATE OF BIRTH:  Feb 19, 1961   DATE OF PROCEDURE:  DATE OF DISCHARGE:                              CARDIAC CATHETERIZATION   PROCEDURE:  Coronary arteriography.   INDICATIONS:  Chest pain, chest CT suggesting premature cardiac  calcification.   Cine catheterization done with 4-French catheter from the right femoral  artery.   Left main coronary was normal.   Left anterior descending artery had a 30% mid vessel lesion.   The circumflex coronary artery was normal.   Right coronary artery had a 30% mid lesion.   There was no critical stenoses fluoroscopically. There was some  calcification in the proximal LAD and right coronary artery.   RAO ventriculography was normal. EF is 60%, there was no gradient across the  aortic valve. No MR. Aortic pressure was 100/55, LV pressure was 100/11.   IMPRESSION:  The patient does need to stop smoking. The CT scan probably  picked up premature extra luminal disease.  Risk factor modification can be  done in [place], [place], by Dr. Dimas Aguas.  Further workup for pulmonary  nodules and abnormal chest CT will also be done up in [place], [place].  The patient does not have critical coronary disease.           ______________________________  Charlton Haws, M.D.     PN/MEDQ  D:  07/09/2005  T:  07/09/2005  Job:  [id number]   cc:   Selinda Flavin  [address]  [place]  [place] [address]  Fax: [phone]   c/o Simona Huh [facility]

## 2011-04-11 NOTE — Assessment & Plan Note (Signed)
[facility]                          [facility] NOTE   [name], [name]                     MRN:          [id number]  DATE:01/29/2007                            DOB:          1961-04-12    PRIMARY CARDIOLOGIST:  Dr. Andee Lineman.   REASON FOR VISIT:  Six month followup.   Since last seen in the clinic in June 2007, the patient reports no  interim development of exertional dyspnea, PND, or orthopnea. She does,  however, note some mild lower extremity edema which has been treated  conservatively.   The patient reports no tachy palpitations.   Unfortunately, the patient continues to smoke at least one pack a day.   The patient is on chronic Coumadin, which is followed by Dr. Humphrey Rolls  office.   CURRENT MEDICATIONS:  1. Coumadin 5 mg as directed.  2. Digoxin 0.125 mg daily.  3. Toprol XL 50 mg daily.   PHYSICAL EXAMINATION:  VITAL SIGNS: Blood pressure 133/79, pulse 62  regular, weight 165.8.  GENERAL: 50 year old female, moderately obese, sitting upright in no  distress.  HEENT: Normocephalic atraumatic.  NECK: Palpable bilateral carotid pulses without bruits.  LUNGS: Clear to auscultation bilaterally.  HEART: Regular rate and rhythm (S1, S2). Crisp prosthetic valve sound  with no appreciable murmur.  ABDOMEN: Protuberant, nontender.  EXTREMITIES: Palpable pulses, there is no significant pitting edema.  NEUROLOGIC: No focal deficit.   IMPRESSION:  1. Rheumatic heart disease.  1A. Status post St. Jude MVR secondary to severe MR/mitral stenosis  April 2007.  1B. Chronic Coumadin: Followed by Dr. Celine Mans.  1. Preserved left ventricular dysfunction.  2. Chronic obstructive pulmonary disease/ongoing tobacco.  3. History of interstitial lung disease.  4A. Secondary to pulmonary venous hypertension secondary to mitral  stenosis.  1. History of congestive heart failure.  2. Obesity.   PLAN:  1. Discontinue Digoxin, as had been  previously recommended.  2. Prescribe Chantix for assistance in smoking cessation.  3. Consider repeat 2D echocardiogram for reassessment of prosthetic      valve and LV function, given that the patient is nearly one year      out.  4. Schedule return clinic follow up with myself and Dr. Andee Lineman in the      next 6-8 months.      Gene Serpe, PA-C  Electronically Signed      Learta Codding, MD,FACC  Electronically Signed   GS/MedQ  DD: 01/29/2007  DT: 01/30/2007  Job #: [id number]   cc:   Atilano Median, MD

## 2011-04-11 NOTE — Consult Note (Signed)
[name], [name]              ACCOUNT NO.:  [id number]   MEDICAL RECORD NO.:  [id number]          PATIENT TYPE:  INP   LOCATION:  2307                         FACILITY:  [facility]   PHYSICIAN:  Zenon Mayo, MDDATE OF BIRTH:  02/11/1961   DATE OF CONSULTATION:  03/18/2006  DATE OF DISCHARGE:                                   CONSULTATION   PROCEDURE:  Intraoperative transesophageal echocardiogram.   INDICATION:  Evaluation of mitral valve.   DESCRIPTION:  Ms. [name] is a 50 year old female with a history of  congestive heart failure and mitral stenosis, and mitral regurgitation who  was brought to the operating room today by Dr. Cornelius Moras for replacement versus  repair of the mitral valve. Intraoperative transesophageal echocardiogram  was requested to further evaluate the valve. The patient was brought to the  operating room, placed under general anesthesia. After confirmation of  endotracheal tube placement, an Echo probe was placed in the patient's  esophagus without complication. The heart was imaged initially in the four  chamber view and revealed no evidence of pericardial or pleural effusion.  The left ventricle was then imaged and showed moderate concentric  hypertrophy. There were no significant abnormalities seen. The ejection  fraction was estimated to be 60%.  The mitral valve was imaged next and  revealed thickened mitral leaflets. The leaflets had reduced excursion, with  the anterior leaflet having slightly more mobility than the posterior  leaflet. When color Doppler was placed across the valve, an eccentric jet, a  severe mitral regurgitation was seen in the left atrium. The jet wrapped  around the atrium.  The mitral valve also appeared to be moderately to  severely stenotic with a turbulent jet seen throughout the left ventricle.  The left atrium was severely dilated at 5.4 cm. When looking at pulmonary  venous flow, there was significant systolic suppression  noted. The aortic  valve was imaged next. The aortic valve was trileaflet and atrium free from  calcifications or vegetations. The aortic valve area was 3.0 cm2.  There was  no aortic insufficiency seen. Tricuspid valve was imaged next and revealed  normal appearing valve. However, when color Doppler was placed across it  there were traced amounts of tricuspid regurgitation. Pulmonic valve also  revealed trace amounts of regurgitation. The intra-atrial septum was intact  and the thoracic aorta revealed mild atherosclerotic disease.   At the conclusion of bypass the heart was again imaged. A St. Jude valve had  been placed in the mitral position. The valve appeared to move  appropriately. There were trace amounts of perivalvular regurgitation seen.  The remainder of the exam was unchanged pre-bypass. An infusion of milrinone  had been started to aid in inotropic support. The patient remained stable  throughout her post bypass course. At the conclusion of procedure the Echo  probe was removed from the patient's esophagus without complications or  evidence of trauma. The patient was taken directly from the operating room  to the intensive care unit in stable condition.           ______________________________  Zenon Mayo,  MD     WEF/MEDQ  D:  03/18/2006  T:  03/19/2006  Job:  [id number]

## 2011-04-11 NOTE — Consult Note (Signed)
[name], [name]              ACCOUNT NO.:  [id number]   MEDICAL RECORD NO.:  [id number]          PATIENT TYPE:  OIB   LOCATION:  1966                         FACILITY:  [facility]   PHYSICIAN:  Charlynne Pander, D.D.S.DATE OF BIRTH:  17-Apr-1961   DATE OF CONSULTATION:  02/09/2006  DATE OF DISCHARGE:  12/23/2005                                   CONSULTATION   REFERRING PHYSICIAN:  Salvatore Decent. Cornelius Moras, M.D.   [name] is a 50 year old female referred by Dr. Tressie Stalker for a  dental consultation.  Patient with recent identification of severe mitral  valve disease with anticipated mitral valve replacement in the near future  with Dr. Cornelius Moras.  Patient is now seen as part of a pre heart valve surgery  dental protocol evaluation to rule out dental infection, which may affect  the patient's systemic health and anticipated heart valve surgery.   MEDICAL HISTORY:  1.  Severe mitral valve disease.      1.  Status post cardiac catheterization on December 23, 2005, that          revealed severe mitral stenosis and regurgitation, minimal          nonobstructive coronary artery disease, severe pulmonary          hypertension, and normal left ventricular function with ejection          fraction at approximately 65%.      2.  Anticipated mitral valve replacement heart surgery with Dr. Cornelius Moras in          the near future.  2.  Coronary artery disease as above.  3.  History of congestive heart failure and shortness of breath.  4.  COPD.  5.  Seasonal allergies.  Uses Benadryl as needed.  6.  Arthritis, primarily affecting her hands and feet.  7.  Chronic headaches occurring approximately one to two times per week.  8.  History of depression.  No current medications.  9.  Status post bilateral tubal ligation in 1990.  10. Status post appendectomy in 1977.   ALLERGIES/ADVERSE DRUG REACTIONS:  NONE KNOWN.   MEDICATIONS:  1.  Lopressor 25 mg twice daily.  2.  Lasix 20 mg daily.  3.  Aspirin  81 mg three tablets daily.  4.  Albuterol inhalation therapy two puffs as needed, primarily in the      morning.   SOCIAL HISTORY:  Patient is divorced.  Patient with a history of smoking one  pack per day for 15 years.  Patient with no significant alcohol use by her  report over the last 20 years.   FAMILY HISTORY:  Mother died of a heart attack at the age of 53.  Father  died of a heart attack at the age of 48.  Sister also has coronary artery  disease and a history of a heart attack.   FUNCTIONAL ASSESSMENT:  Patient remains independent for basic ADLs at this  time.   REVIEW OF SYSTEMS:  This was reviewed with the patient and is included in  the dental consultation record.   DENTAL HISTORY:  CHIEF COMPLAINT:  Patient needs a pre heart valve surgery dental evaluation.   HISTORY OF PRESENT ILLNESS:  Patient recently identified with severe mitral  valve disease.  Patient with anticipated mitral valve replacement.  Patient  is now seen as part of a pre heart valve surgery dental protocol to rule out  dental infection, which would affect the patient's systemic health and  anticipated heart valve surgery.   Patient currently with a history of toothache symptoms involving the upper  right quadrant.  Patient points to the area of teeth #6 and #7.  Patient  describes the pain as being sharp and intermittent in nature.  Patient  indicates that the pain is spontaneous at times.  Patient indicates that the  pain lasts for hours at a time.  Patient indicates that this has been  currently hurting since February 06, 2006.  Patient indicates that the pain is  now 8/10 in intensity.   Patient was last seen by a dentist in 2000.  Patient indicates that she had  a root canal and a crown placed at that time and this was at [facility] in [place], [place].  Patient denies having regular dental  care at this time.   DENTAL EXAM:  GENERAL:  The patient is a well-developed,  well-nourished  female in no acute distress.  VITAL SIGNS:  Blood pressure 96/60, pulse 65, temperature 97.9.  HEAD AND NECK:  There is no palpable lymphadenopathy.  The patient denies  acute TMJ symptoms.  INTRAORAL:  Patient has normal saliva.  There is no evidence of abscess  formation within the mouth at this time.  There is no evidence of purulence  in the mouth at this time.  DENTITION:  Patient is missing multiple teeth #1, 2, 3, 14, 16, 19, and 30.  Patient has multiple retained roots in the area of #4, 5, 6, 7, 10, 12, 13,  20, 21, 31, and 32.  Patient has impacted tooth #17.  DENTAL CARIES: Dental caries are rampant.  Dental caries are affecting the  remaining dentition.  Please see dental charting form.  ENDODONTIC:  Patient with a history of acute pulpitis symptoms.  There are  multiple areas of pathology including teeth #4, 5, 6, 9, 10, 12, 13, 15, 20,  21, 31, and 32.  CROWN OR BRIDGE:  There are no crown or bridge restorations.  PROSTHODONTIC:  Patient has no dentures.  OCCLUSION:  Patient has a poor occlusal scheme secondary to multiple missing  teeth, multiple retained root segments, superior eruption and drifting of  the unopposed teeth into the edentulous areas, and lack of replacement of  the missing teeth with dental restorations.   RADIOGRAPHIC INTERPRETATION:  A panoramic x-ray was taken and supplemented  with a full series of dental radiographs.  There are multiple missing teeth.  There are multiple retained root segments.  There are rampant dental caries  noted.  There is chronic advanced periodontal disease noted.  There are  multiple areas of periapical pathology.  There is an impacted tooth #17.   ASSESSMENT:  1.  History of acute pulpitis symptoms involving the upper right quadrant,      most likely tooth #6.  2.  Multiple areas of periapical pathology.  3.  History of oral neglect.  4.  Rampant dental caries. 5.  Chronic periodontitis with moderate  bone loss.  6.  Gingival recession.  7.  Tooth mobility.  8.  Impacted tooth #17.  9.  Supereruption  and drifting of the unopposed teeth into the edentulous      areas.  10. Lack of replacement of missing teeth with dental restorations.  11. Poor occlusal scheme.  12. Risk for complications with invasive dental procedures due to the      patient's overall cardiac status.   PLAN/RECOMMENDATIONS:  1.  I discussed the risks, benefits, and complications of various treatment      options with the patient in relationship to her medical and dental      complications and anticipated heart valve surgery and risks for subacute      bacterial endocarditis.  We discussed various treatment options to      include no treatment, total and subtotal extractions with alveoloplasty,      removal of impacted tooth #17, periodontal therapy, dental restorations,      root canal therapy, crown and bridge therapy, implant therapy, and      replacing the missing teeth as indicated by the dentist of her choice in      the future after adequate healing.  The patient currently is interested      in proceeding with extraction of all remaining teeth with alveoloplasty      as indicated.  Patient is aware of the significant complications      associated with proceeding with dental treatment at this time.  2.  Discussion of findings with Dr. Tressie Stalker and Dr. Lewayne Bunting as      indicated.  Will discuss medical ability/stability of the patient to      undergo anticipated full mouth extractions as planned and ability to      proceed with general anesthesia.  3.  Will consider referral to an oral surgeon due to the complexity of the      case and further discuss removal of tooth #17 due to its proximity to      the mandibular alveolar nerve canal.  Will also discuss potentially      leaving the tooth as is at this time.  4.  Referral to a general dentist of her choice for fabrication of upper and      lower  dentures as indicated.  Patient was given the option of retaining      six or seven lower teeth and subsequent lower cast partial fabrication,      but the patient refused.  5.  Will use SBE antibiotic prophylaxis as indicated per American Heart      Association guidelines.      Charlynne Pander, D.D.S.  Electronically Signed     RFK/MEDQ  D:  02/09/2006  T:  02/10/2006  Job:  [id number]   cc:   Salvatore Decent. Cornelius Moras, M.D.  [address]  [place]  [place] [address]   Learta Codding, M.D. [facility]  [address]  [address]  [place]  [place] [address]

## 2011-04-11 NOTE — Discharge Summary (Signed)
NAMEFRANK, PILGER              ACCOUNT NO.:  [id number]   MEDICAL RECORD NO.:  [id number]          PATIENT TYPE:  INP   LOCATION:  2041                         FACILITY:  [facility]   PHYSICIAN:  Salvatore Decent. Cornelius Moras, M.D. DATE OF BIRTH:  1961-07-18   DATE OF ADMISSION:  03/18/2006  DATE OF DISCHARGE:  03/23/2006                                 DISCHARGE SUMMARY   HISTORY OF PRESENT ILLNESS:  The patient is a 49 year old female from [place],  [place], referred to Dr. Tressie Stalker for a surgical opinion  regarding severe mitral regurgitation and mitral stenosis.  The patient has  recently been evaluated after an episode of congestive heart failure.  This  included a cardiac catheterization, as well as echocardiograms.  The patient  was found to have severe mitral stenosis and mitral regurgitation with  underlying likely rheumatic heart disease and severe pulmonary hypertension.  It was Dr. Orvan July opinion that she would best be served by undergoing mitral  valve replacement and she was admitted this hospitalization for the  procedure.   PAST MEDICAL HISTORY:  1.  Congestive heart failure.  2.  Long-standing tobacco abuse.  3.  Chronic obstructive pulmonary disease.  4.  History of mediastinal lymphadenopathy and ground glass diffuse      pulmonary opacity on chest CT scan.  5.  Mitral valvular disease, as described above.   PAST SURGICAL HISTORY:  1.  Bilateral tubal ligation.  2.  Appendectomy.   MEDICATIONS PRIOR TO ADMISSION:  1.  Lopressor 25 mg twice daily.  2.  Lasix 20 mg daily.  3.  Aspirin 325 mg daily.  4.  Albuterol inhaler two puffs every morning.   ALLERGIES:  No known drug allergies.   FAMILY HISTORY, SOCIAL HISTORY, REVIEW OF SYSTEMS, AND PHYSICAL EXAMINATION:  Please see Dr. Orvan July history and physical done at the time of  admission/consultation.   HOSPITAL COURSE:  The patient was admitted electively, and on April 25, she  was taken to the operating room,  where she underwent a mitral valve  replacement with a #25 [employer] mechanical valve prosthetic.  The patient  tolerated the procedure well and was taken to the surgical intensive care  unit in stable condition.   POSTOPERATIVE HOSPITAL COURSE:  The patient has done quite well.  She has  remained hemodynamically stable.  She initially did require some pressor  support, but this was weaned without difficulty.  She has tolerated a gentle  diuresis and is showing no evidence of congestive heart failure.  Her lines,  monitors, and drainage devices were all discontinued in the standard  fashion.  Her laboratory values do reveal a postoperative anemia, but this  is stable.  Hemoglobin and hematocrit, dated March 21, 2006, are 10 and 30,  respectively.  Electrolytes, BUN, and creatinine are all within normal  limits.  The patient has been started on Coumadin with daily INR management.  The patient's final dosing schedule will be determined at the time of  discharge.  The patient's pulmonary status has shown a good and gradual  improvement in chest x-ray appearance and  she has been weaned from oxygen,  maintaining good saturations on room air.  Her rhythm has maintained sinus  with occasional early use of atrial pacing.  This has been weaned and she is  having no significant ectopy or dysrhythmias.  Her overall status is felt to  be stable for tentative discharge on the morning of March 23, 2006, pending  morning round reevaluation.   DISCHARGE INSTRUCTIONS:  The patient will receive written instructions in  regards to medications, activity and diet, wound care, and follow up.  Follow up will include Dr. Cornelius Moras in three weeks.  The office will call with  an appointment.  Additionally, she was instructed to get her PT/INR checked  for Coumadin dosing through the [facility], with the first to  be done on Mar 26, 2006.  Additionally, she is instructed to follow up with  Dr. Andee Lineman in  two weeks.   DISCHARGE MEDICATIONS:  Toprol-XL 25 mg daily, digoxin 0.125 mg daily,  Coumadin dosage to be determined at the time of discharge.  For pain, Tylox  one to two every 4-6 hours as needed.  She should also continue to use her  albuterol inhaler as needed.   FINAL DIAGNOSIS:  Severe mitral regurgitation and mitral stenosis.   OTHER DIAGNOSES:  1.  Probable interstitial lung disease by CAT scan findings.  2.  History of recent congestive heart failure related to her valvular      disease.  3.  Long-standing tobacco abuse.  4.  Chronic obstructive pulmonary disease.  5.  History of mediastinal lymphadenopathy.  6.  Mild postoperative anemia.      Rowe Clack, P.A.-C.      Salvatore Decent. Cornelius Moras, M.D.  Electronically Signed    WEG/MEDQ  D:  03/22/2006  T:  03/23/2006  Job:  [id number]   cc:   Salvatore Decent. Cornelius Moras, M.D.  [address]  [place]  [place] [address]   CVTS office   Learta Codding, M.D. [facility]  [address]  [address]  [place]  [place] [address]   Selinda Flavin  Fax: [phone]

## 2011-04-11 NOTE — Op Note (Signed)
[name], [name]              ACCOUNT NO.:  [id number]   MEDICAL RECORD NO.:  [id number]          PATIENT TYPE:  AMB   LOCATION:  DAY                          [facility]AC[facility]   PHYSICIAN:  Charlynne Pander, D.D.S.DATE OF BIRTH:  1961/01/20   DATE OF PROCEDURE:  02/26/2006  DATE OF DISCHARGE:                                 OPERATIVE REPORT   PREOPERATIVE DIAGNOSES:  1.  Severe mitral valve disease.  2.  Pre-Mitral valve replacement heart surgery dental protocol  3.  Chronic apical periodontitis.  4.  Chronic periodontitis.  5.  Acute pulpitis.  6.  Multiple retained root segments.  7.  Maxillary right buccal exostoses.  8.  Impacted tooth #17 .   POSTOPERATIVE DIAGNOSES:  1.  Severe mitral valve disease.  2.  Pre mitral valve replacement heart surgery dental protocol  3.  Chronic apical periodontitis.  4.  Chronic periodontitis.  5.  Acute pulpitis.  6.  Multiple retained root segments.  7.  Maxillary right buccal exostoses.  8.  Impacted tooth #17 .   OPERATIONS:  1.  Multiple extractions of tooth numbers 4, 5, 6, 7, 8, 9. 10, 11, 12, 13,      15, 18, 20, 21, 22, 23, 24, 25, 26, 27, 28, 29, 31 and 32 .  2.  Four quadrants of alveoloplasty.  3.  Maxillary right buccal exostoses reductions.   SURGEON:  Charlynne Pander, D.D.S.   ASSISTANT:  Elliot Dally (Sales executive).   ANESTHESIA:  General anesthesia via nasoendotracheal tube.   MEDICATIONS:  1.  Ampicillin 2.0 grams IV prior to invasive dental procedures.  2.  Local anesthesia with a total utilization of four carpules each      containing 36 mg Xylocaine with 0.018 mg of epinephrine as well as two      carpules each containing 9 mg of bupivacaine with 0.009 mg of      epinephrine .   SPECIMENS:  There were 24 teeth which were discarded.   CULTURES:  None.   DRAINS:  None.   COMPLICATIONS:  None.   ESTIMATED BLOOD LOSS:  Less than 100 mL.   FLUIDS:  1000 mL lactated Ringer solution.   INDICATIONS:  The patient was recently diagnosed with severe mitral  regurgitation and needed a mitral valve replacement with Dr. Tressie Stalker.  The patient was evaluated and treatment planned to rule out dental infection  which would affect the patient's systemic health and anticipated heart valve  surgery.  The patient was therefore planned for multiple extractions with  alveoloplasty and pre prosthetic surgery as indicated.  This treatment plan  was formulated to decrease the risk and complications associated dental  infection from an affecting the patient's systemic health and anticipated  heart valve surgery .   OPERATIVE FINDINGS:  The patient was examined operating room #6.  The teeth  were identified for extraction.  The patient was noted to be affected by  chronic apical periodontitis, acute pulpitis, chronic periodontitis,  multiple retained root segments, and the presence of impacted tooth #17.  The aforementioned necessitated removal of all  remaining teeth with the  exception of tooth #17 along with alveoloplasty and maxillary right  exostoses reductions.   DESCRIPTION OF PROCEDURE:  The patient was brought to the main operating  room #6.  The patient was placed in supine position on operating room table.  General anesthesia was induced with a nasoendotracheal tube.  The patient  was then prepped and draped in the usual manner for dental medicine  procedure.  Throat pack was placed at this time.  The oral cavity was  thoroughly examined with the findings as noted above.  The patient was then  ready for the dental medicine procedure as follows:   Local anesthesia was administered sequentially over the 2-hour long  procedure with total utilization of four carpules each containing 36 mg  Xylocaine and 0.018 mg of epinephrine as well as two carpules containing 9  mg of bupivacaine with 0.009 mg of epinephrine.   The maxillary quadrants were first approached.  Anesthesia was  delivered to  the maxillary arch at this time.  15 blade incision was made from the distal  of #2 and extended to the distal of number 16.  Surgical flap was then  carefully reflected.  Appropriate amounts of buccal and interseptal bone was  removed at this time.  The teeth were subluxated and then removed with a 150  forceps.  This included tooth numbers 4, 5, 6, 7, 8, 9, 10, 11, 12, 13, and  15.  At this point time the buccal exostoses involving the maxillary right  quadrant were visualized and then removed with a rongeurs and bone file  appropriately.  Further alveoloplasty was then performed utilizing rongeurs  and bone file.  The surgical sites were then irrigated with copious amounts  sterile saline.  The maxillary right surgical site was then closed utilizing  3-0 chromic gut suture in a continuous interrupted suture technique from the  distal of #2 and extended to the mesial of #8.  The maxillary left quadrant  was then approached and the surgical site was closed from the distal of #16  and extended to the mesial of #9 utilizing 3-0 chromic gut suture in a  continuous interrupted suture technique x 1.  Two separate interrupted  sutures were then further used to close the surgical site appropriately.   At this point time the mandibular teeth were approached.  The patient was  anesthetized utilizing bilateral inferior alveolar nerve blocks and the 9 mg  of bupivacaine with 0.009 mg of epinephrine each.  Infiltration was then  further achieved utilizing the Xylocaine with epinephrine.  The 15 blade  incision was made from the distal of #32 and extended to the mesial of #22.  Surgical flap was then carefully reflected.  Appropriate amounts of buccal  and interseptal bone was removed around the remaining lower teeth.  These  teeth were then subluxated and tooth numbers 23, 24, 25, 26, 27, 28, 29, 31  and 32 were then removed with a 151 forceps without complications. Alveoplasty was then  performed utilizing rongeurs and bone file.  The  surgical site was then irrigated with copious amounts sterile saline.  The  tissues were approximated and trimmed appropriately.  The surgical site was  then closed from a distal #32 and extended to the mesial of #25 utilizing 3-  0 chromic gut suture in a continuous interrupted suture technique x1.  Several interrupted sutures were then further utilized to further close the  surgical site at this time.  At this point time, 15 blade incision was made from the mesial of #18 and  extended to the mesial of #22 surgical flap was then carefully reflected.  Appropriate amounts of buccal and interseptal bone was removed around the  buccal of #18 as well as 20, 21 and  22.  These teeth were then subluxated  and tooth numbers 20, 21 and 22 were then removed with a 151 forceps.  Tooth  #18 was then removed with a 23 forceps without complications.  At this point  in time, visualization of impacted tooth #17 was attempted but the impacted  tooth was not seen.  It was felt that this impacted tooth would be left as  is as previously discussed.  This tooth will be followed for further  complications or eruption into the oral cavity.  At this point time  alveoloplasty was performed utilizing a rongeurs and bone file.  The  surgical site was irrigated with copious amounts sterile saline.  Soft  tissues were trimmed appropriately.  The surgical site was then closed from  the distal #18 extended to the mesial of #24 utilizing 3-0 chromic gut  suture in a continuous interrupted suture technique x1. Several interrupted  sutures were then further utilized to close the surgical sites  appropriately.  This point time the entire mouth was irrigated with copious  amounts of sterile saline.  The patient was then examined for complications,  seeing none, the dental medicine procedure was deemed to be complete.  The  throat pack was removed at this time.  A series of  4x4 gauzes were placed in  the mouth to aid hemostasis.  The patient was then handed over to the  anesthesia team for final disposition.  After appropriate amount of time,  the patient was extubated, taken to the post anesthesia care unit with  stable vital signs and a good oxygenation level.  All counts were correct  for dental medicine procedure.  The patient will be given antibiotic therapy  utilizing amoxicillin 500 mg every 8 hours for the next 7 days along with  Percocet pain medication.  The patient will be seen in approximately 1 week  for evaluation for suture removal as indicated.  The patient will be  evaluated and then cleared for the heart valve surgery as indicated.      Charlynne Pander, D.D.S.  Electronically Signed     RFK/MEDQ  D:  02/26/2006  T:  02/27/2006  Job:  [id number]   cc:   Salvatore Decent. Cornelius Moras, M.D.  [address]  [place]  [place] [address]   Learta Codding, M.D. [facility]  [address]  [address]  [place]  [place] [address]

## 2011-04-25 ENCOUNTER — Ambulatory Visit: Payer: Self-pay | Admitting: Cardiology

## 2011-05-05 ENCOUNTER — Encounter: Payer: Self-pay | Admitting: Cardiology

## 2011-06-03 ENCOUNTER — Other Ambulatory Visit: Payer: Self-pay | Admitting: *Deleted

## 2011-06-03 MED ORDER — DIGOXIN 125 MCG PO TABS: 125.0000 ug | ORAL_TABLET | Freq: Every day | ORAL | Status: DC

## 2011-06-03 MED ORDER — SPIRONOLACTONE 25 MG PO TABS: 25.0000 mg | ORAL_TABLET | Freq: Every day | ORAL | Status: DC

## 2011-06-03 MED ORDER — CARVEDILOL 6.25 MG PO TABS: 6.2500 mg | ORAL_TABLET | Freq: Two times a day (BID) | ORAL | Status: DC

## 2011-06-26 ENCOUNTER — Encounter: Payer: Self-pay | Admitting: Cardiology

## 2011-07-11 ENCOUNTER — Ambulatory Visit: Payer: Self-pay | Admitting: Cardiology

## 2011-08-01 ENCOUNTER — Other Ambulatory Visit: Payer: Self-pay | Admitting: *Deleted

## 2011-08-01 MED ORDER — NITROGLYCERIN 0.4 MG SL SUBL: 0.4000 mg | SUBLINGUAL_TABLET | SUBLINGUAL | Status: DC | PRN

## 2011-09-12 ENCOUNTER — Ambulatory Visit (INDEPENDENT_AMBULATORY_CARE_PROVIDER_SITE_OTHER): Payer: Medicaid Other | Admitting: Cardiology

## 2011-09-12 ENCOUNTER — Encounter: Payer: Self-pay | Admitting: Cardiology

## 2011-09-12 VITALS — BP 154/88 | HR 91 | Ht 64.0 in | Wt 197.0 lb

## 2011-09-12 DIAGNOSIS — R0602 Shortness of breath: Secondary | ICD-10-CM

## 2011-09-12 DIAGNOSIS — I059 Rheumatic mitral valve disease, unspecified: Secondary | ICD-10-CM

## 2011-09-12 DIAGNOSIS — I2510 Atherosclerotic heart disease of native coronary artery without angina pectoris: Secondary | ICD-10-CM

## 2011-09-12 DIAGNOSIS — Z9889 Other specified postprocedural states: Secondary | ICD-10-CM

## 2011-09-12 DIAGNOSIS — R072 Precordial pain: Secondary | ICD-10-CM

## 2011-09-12 DIAGNOSIS — I200 Unstable angina: Secondary | ICD-10-CM

## 2011-09-12 DIAGNOSIS — I5022 Chronic systolic (congestive) heart failure: Secondary | ICD-10-CM

## 2011-09-12 MED ORDER — CARVEDILOL 6.25 MG PO TABS: ORAL_TABLET | ORAL | Status: DC

## 2011-09-12 MED ORDER — ENALAPRIL MALEATE 5 MG PO TABS: 5.0000 mg | ORAL_TABLET | Freq: Two times a day (BID) | ORAL | Status: DC

## 2011-09-12 NOTE — Assessment & Plan Note (Signed)
Increase enalapril to 5 mg by mouth twice a day and increase Coreg to 9.375 nausea twice a day. Continue digoxin and spironolactone. Blood work will be ordered

## 2011-09-12 NOTE — Assessment & Plan Note (Signed)
Patient has no recurrent chest pain. I agree discussion with Dr. Excell Seltzer regarding the possibility of doing a repeat cardiac catheterization however given the patient's prior complications and the fact that she is clinically doing well we will hold off at this point. We will stop aspirin because she is on dual antiplatelet therapy and Coumadin. She also has a bare-metal stent not a drug-eluting stent in the left main coronary artery. Dr. Excell Seltzer did not recommend any P2 Y. 12 testing

## 2011-09-12 NOTE — Assessment & Plan Note (Signed)
Continue Coumadin therapy. INR level most recently 3.2 and followed closely by her primary care physician

## 2011-09-12 NOTE — Patient Instructions (Signed)
   Stop Aspirin Your physician recommends that you go to the [facility] for lab work in 7-10 days for BMET & BNP If the results of your test are normal or stable, you will receive a letter.  If they are abnormal, the nurse will contact you by phone. Increase Enalapril to 5mg  twice a day  Increase Coreg to 9.375mg  twice a day   Your physician wants you to follow up in: 6 months.  You will receive a reminder letter in the mail one-two months in advance.  If you don't receive a letter, please call our office to schedule the follow up appointment

## 2011-09-12 NOTE — Progress Notes (Signed)
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 She had no residual circumflex and right coronary artery disease severe LV depression ejection fraction of about 25-30%. She presented in cardiogenic shock and heart failure. After her procedure she wore her life vest a followup echocardiogram showed an ejection fraction of 45% and the patient was not a candidate for ICD. Of note is also that the patient has a St. Jude prosthetic mitral valve and is on Coumadin therapy as well as dual antiplatelet therapy. She reports no bleeding complications. She reports no chest pain shortness of breath orthopnea PND. Unfortunately she continues to smoke but has cut back down to half a pack a day INR is followed by her primary care physician and her latest level was 3.2.   Allergies, family history and social history: As documented in chart and reviewed  Medications: Documented and reviewed in chart.  Past medical history: Reviewed and see problem list below   Review of systems: No nausea or vomiting. No fever or chills no melena hematochezia. No dysuria or frequency. No visual changes no bleeding complications or claudication   Physical examination : Vital signs documented below General: Overweight white female but in no apparent distress HEENT: EOMI, PERRLA, normal carotid upstroke no carotid bruits. No thyromegaly nonnodular thyroid Lungs: Clear breath sounds bilaterally no wheezing Heart: Rate and rhythm with normal closing click of S1 consistent with prostatic valve, normal S2 and no pathological murmurs Abdomen: Soft nontender no rebound or guarding good bowel sounds Extremity exam: No cyanosis clubbing or edema Neurologic: Alert oriented and grossly  nonfocal. Psychiatric: Normal affect. Vascular exam: Normal peripheral pulses bilaterally  12-lead electrocardiogram normal sinus rhythm with anterior Q waves consistent with old anterior wall myocardial infarction Limited bedside echocardiogram: Ejection fraction 45% with mid anterior, distal anterior distal septal and periapical akinesis. Valve grossly normal with no significant aortic insufficiency. Well-seated mitral valve mechanical prosthesis.

## 2011-09-30 ENCOUNTER — Other Ambulatory Visit: Payer: Self-pay | Admitting: Cardiology

## 2012-01-08 ENCOUNTER — Encounter: Payer: Self-pay | Admitting: *Deleted

## 2012-04-13 ENCOUNTER — Ambulatory Visit (INDEPENDENT_AMBULATORY_CARE_PROVIDER_SITE_OTHER): Payer: Medicaid Other | Admitting: Cardiology

## 2012-04-13 ENCOUNTER — Encounter: Payer: Self-pay | Admitting: Cardiology

## 2012-04-13 VITALS — BP 110/70 | HR 80 | Ht 64.0 in | Wt 203.0 lb

## 2012-04-13 DIAGNOSIS — I5022 Chronic systolic (congestive) heart failure: Secondary | ICD-10-CM

## 2012-04-13 DIAGNOSIS — I059 Rheumatic mitral valve disease, unspecified: Secondary | ICD-10-CM

## 2012-04-13 DIAGNOSIS — E782 Mixed hyperlipidemia: Secondary | ICD-10-CM

## 2012-04-13 DIAGNOSIS — G8929 Other chronic pain: Secondary | ICD-10-CM | POA: Insufficient documentation

## 2012-04-13 DIAGNOSIS — I2510 Atherosclerotic heart disease of native coronary artery without angina pectoris: Secondary | ICD-10-CM

## 2012-04-13 DIAGNOSIS — I2109 ST elevation (STEMI) myocardial infarction involving other coronary artery of anterior wall: Secondary | ICD-10-CM

## 2012-04-13 DIAGNOSIS — Z9889 Other specified postprocedural states: Secondary | ICD-10-CM

## 2012-04-13 DIAGNOSIS — M549 Dorsalgia, unspecified: Secondary | ICD-10-CM

## 2012-04-13 NOTE — Progress Notes (Signed)
Brooke Bottoms, MD, [facility] ABIM Board Certified in Adult Cardiovascular Medicine,Internal Medicine and Critical Care Medicine    CC: Followup patient with a history of left main coronary stent as a complication the setting of an acute anterior wall myocardial infarction.  HPI:  The patient reports no recurrent chest pain. She has no shortness of breath orthopnea PND. She has occasional panic attacks for which he takes Xanax. Coumadin levels have been well-controlled the patient takes Plavix but no aspirin to avoid triple antiplatelet and anticoagulation therapy. The plan is to consider back surgery but the patient is very concerned about this. She has also been told that her pain medications are causing addictive behavior. No orthopnea or PND. Patient is able to walk up a flight of stairs but this limited by her back pain.  PMH: reviewed and listed in Problem List in Electronic Records (and see below) Past Medical History  Diagnosis Date  . Rheumatic heart disease     s/p St. Jude mitral valve in 2007  . Coronary disease     Minimal nonobstructive by cardiac catheterization prior to her surgery in 2007, ejection fraction of 55% at that time.  Marland Kitchen COPD (chronic obstructive pulmonary disease)   . Hypertension   . History of CHF (congestive heart failure)     Ejection fraction 45% not candidate for ICD.  Marland Kitchen Myocardial infarction, anterior wall      late reperfusion occluded LAD treated with a bare-metal stent complicated by catheter-induced left mainstem dissection and also treated with a bare-metal stent., initial LV function 25-30% followup echocardiogram ejection fraction 45%   . Back pain      chronic   Past Surgical History  Procedure Date  . Cardiac catheterization   . Median sternotomy     for mitral valve replacement (25 mm St. Jude mechanical prosthesis.)    Allergies/SH/FHX : available in Electronic Records for review  Allergies  Allergen Reactions  . Cyclobenzaprine Hcl    REACTION: N/V   History   Social History  . Marital Status: Divorced    Spouse Name: N/A    Number of Children: N/A  . Years of Education: N/A   Occupational History  . Not on file.   Social History Main Topics  . Smoking status: Current Everyday Smoker -- 1.0 packs/day for 25 years    Types: Cigarettes  . Smokeless tobacco: Never Used  . Alcohol Use: Not on file  . Drug Use: Not on file  . Sexually Active: Not on file   Other Topics Concern  . Not on file   Social History Narrative   Lives in [place] with a boyfriend. She is currently disabled.    Family History  Problem Relation Age of Onset  . Heart attack Mother 21  . Heart attack Father 32  . Coronary artery disease Sister     Medications: Current Outpatient Prescriptions  Medication Sig Dispense Refill  . ALPRAZolam (XANAX) 0.5 MG tablet Take 0.5 mg by mouth 2 (two) times daily as needed.        . carvedilol (COREG) 6.25 MG tablet Take 1 1/2 tabs (9.375mg ) twice a day  90 tablet  6  . clopidogrel (PLAVIX) 75 MG tablet Take 75 mg by mouth daily.        . enalapril (VASOTEC) 5 MG tablet Take 1 tablet (5 mg total) by mouth 2 (two) times daily.  60 tablet  6  . furosemide (LASIX) 40 MG tablet Take 40 mg by  mouth daily as needed.       Marland Kitchen HYDROcodone-acetaminophen (VICODIN) 5-500 MG per tablet Take 1 tablet by mouth 2 (two) times daily as needed.        . nitroGLYCERIN (NITROSTAT) 0.4 MG SL tablet Place 1 tablet (0.4 mg total) under the tongue every 5 (five) minutes as needed.  25 tablet  2  . promethazine (PHENERGAN) 25 MG tablet Take 1 tablet by mouth Twice daily as needed.      . warfarin (COUMADIN) 5 MG tablet Take 5 mg by mouth daily. As directed per [facility] office       . zolpidem (AMBIEN) 10 MG tablet Take 10 mg by mouth at bedtime.        ROS: No nausea or vomiting. No fever or chills.No melena or hematochezia.No bleeding.No claudication  Physical Exam: BP 110/70  Pulse 80  Ht 5\' 4"  (1.626 m)  Wt 203 lb (92.08  kg)  BMI 34.84 kg/m2  SpO2 96% General: Mildly obese white female but in no distress complaining of some back pain Neck: Normal carotid upstroke no carotid bruits. No thyromegaly nonnodular thyroid. JVP is 6 cm Lungs: Clear breath sounds bilaterally. No wheezing Cardiac: Regular rate and rhythm with normal closing click of S1. No pathological murmurs no S3 Vascular: No edema. Normal distal pulses Skin: Warm and dry Physcologic: Normal affect  12lead ECG: Not obtained Limited bedside ECHO:N/A No images are attached to the encounter.   Assessment and Plan  MITRAL VALVE REPLACEMENT, HX OF Normal bowel function by examination. The patient has no pathological murmurs and normal closing click of S1. She's compliant with Coumadin therapy.  Myocardial infarction, anterior wall Patient is taking both Plavix and Coumadin. Aspirin has been discontinued. Despite left main coronary stent the plan was discussed with Dr. Excell Seltzer previously and we do not plan on a routine cardiac catheterization. The patient is also asymptomatic but may benefit from a stress test in the near future.  Chronic systolic heart failure Initial ejection fraction after anterior wall myocardial infarction with significantly reduced. However ejection fraction is improved to 45%. We'll schedule for followup echocardiogram to assess ejection fraction as well as mitral valve function  HYPERLIPIDEMIA, MIXED Laboratory work followed by primary care physician  Back pain Patient reportedly has bone spurs. From my perspective I cannot recommend back surgery unless life-threatening. The patient would be at high risk for acute stent thrombosis of the left main coronary artery stent. The patient is to continue lifelong Plavix and in her situation given her mitral valve replacement also on Coumadin. The patient's back pain is still controlled with pain medication, and in my opinion he seems the best long-term option. Addiction should not  be considered an issue at this point in time particularly as the risk of non-life-threatening surgery outweighed the risk that is associated with acute stent thrombosis. I made it very clear to the patient.  CAD No recurrent chest pain stable from a clinical perspective but we will followup with an echocardiogram. Continue Plavix but no aspirin as the patient is on Coumadin.    Patient Active Problem List  Diagnoses  . HYPERLIPIDEMIA, MIXED  . CAD  . Chronic systolic heart failure  . MITRAL VALVE REPLACEMENT, HX OF  . Back pain  . Myocardial infarction, anterior wall

## 2012-04-13 NOTE — Assessment & Plan Note (Signed)
Normal bowel function by examination. The patient has no pathological murmurs and normal closing click of S1. She's compliant with Coumadin therapy.

## 2012-04-13 NOTE — Patient Instructions (Signed)
   Echo If the results of your test are normal or stable, you will receive a letter.  If they are abnormal, the nurse will contact you by phone. Your physician wants you to follow up in: 6 months.  You will receive a reminder letter in the mail one-two months in advance.  If you don't receive a letter, please call our office to schedule the follow up appointment  

## 2012-04-13 NOTE — Assessment & Plan Note (Signed)
Patient reportedly has bone spurs. From my perspective I cannot recommend back surgery unless life-threatening. The patient would be at high risk for acute stent thrombosis of the left main coronary artery stent. The patient is to continue lifelong Plavix and in her situation given her mitral valve replacement also on Coumadin. The patient's back pain is still controlled with pain medication, and in my opinion he seems the best long-term option. Addiction should not be considered an issue at this point in time particularly as the risk of non-life-threatening surgery outweighed the risk that is associated with acute stent thrombosis. I made it very clear to the patient.

## 2012-04-13 NOTE — Assessment & Plan Note (Signed)
Patient is taking both Plavix and Coumadin. Aspirin has been discontinued. Despite left main coronary stent the plan was discussed with Dr. Excell Seltzer previously and we do not plan on a routine cardiac catheterization. The patient is also asymptomatic but may benefit from a stress test in the near future.

## 2012-04-13 NOTE — Assessment & Plan Note (Signed)
Initial ejection fraction after anterior wall myocardial infarction with significantly reduced. However ejection fraction is improved to 45%. We'll schedule for followup echocardiogram to assess ejection fraction as well as mitral valve function

## 2012-04-13 NOTE — Assessment & Plan Note (Signed)
No recurrent chest pain stable from a clinical perspective but we will followup with an echocardiogram. Continue Plavix but no aspirin as the patient is on Coumadin.

## 2012-04-13 NOTE — Assessment & Plan Note (Signed)
Laboratory work followed by primary care physician

## 2012-04-21 ENCOUNTER — Encounter: Payer: Self-pay | Admitting: *Deleted

## 2012-04-28 ENCOUNTER — Other Ambulatory Visit: Payer: Medicaid Other

## 2012-09-24 ENCOUNTER — Encounter (HOSPITAL_COMMUNITY): Payer: Self-pay | Admitting: General Practice

## 2012-09-24 ENCOUNTER — Inpatient Hospital Stay (HOSPITAL_COMMUNITY)
Admission: RE | Admit: 2012-09-24 | Discharge: 2012-09-29 | DRG: 065 | Disposition: A | Discharge status: Home / Self Care | Payer: Medicaid Other | Source: Other Acute Inpatient Hospital | Attending: Family Medicine | Admitting: Family Medicine

## 2012-09-24 ENCOUNTER — Inpatient Hospital Stay (HOSPITAL_COMMUNITY): Payer: Medicaid Other

## 2012-09-24 DIAGNOSIS — M549 Dorsalgia, unspecified: Secondary | ICD-10-CM | POA: Diagnosis present

## 2012-09-24 DIAGNOSIS — Z7901 Long term (current) use of anticoagulants: Secondary | ICD-10-CM

## 2012-09-24 DIAGNOSIS — J4489 Other specified chronic obstructive pulmonary disease: Secondary | ICD-10-CM | POA: Diagnosis present

## 2012-09-24 DIAGNOSIS — G8929 Other chronic pain: Secondary | ICD-10-CM | POA: Diagnosis present

## 2012-09-24 DIAGNOSIS — R209 Unspecified disturbances of skin sensation: Secondary | ICD-10-CM | POA: Diagnosis present

## 2012-09-24 DIAGNOSIS — I099 Rheumatic heart disease, unspecified: Secondary | ICD-10-CM | POA: Diagnosis present

## 2012-09-24 DIAGNOSIS — I6340 Cerebral infarction due to embolism of unspecified cerebral artery: Principal | ICD-10-CM | POA: Diagnosis present

## 2012-09-24 DIAGNOSIS — I639 Cerebral infarction, unspecified: Secondary | ICD-10-CM

## 2012-09-24 DIAGNOSIS — I252 Old myocardial infarction: Secondary | ICD-10-CM

## 2012-09-24 DIAGNOSIS — E785 Hyperlipidemia, unspecified: Secondary | ICD-10-CM | POA: Diagnosis present

## 2012-09-24 DIAGNOSIS — F1210 Cannabis abuse, uncomplicated: Secondary | ICD-10-CM | POA: Diagnosis present

## 2012-09-24 DIAGNOSIS — D72829 Elevated white blood cell count, unspecified: Secondary | ICD-10-CM

## 2012-09-24 DIAGNOSIS — Z954 Presence of other heart-valve replacement: Secondary | ICD-10-CM

## 2012-09-24 DIAGNOSIS — R4701 Aphasia: Secondary | ICD-10-CM | POA: Diagnosis present

## 2012-09-24 DIAGNOSIS — F411 Generalized anxiety disorder: Secondary | ICD-10-CM | POA: Diagnosis present

## 2012-09-24 DIAGNOSIS — Z888 Allergy status to other drugs, medicaments and biological substances status: Secondary | ICD-10-CM

## 2012-09-24 DIAGNOSIS — F17200 Nicotine dependence, unspecified, uncomplicated: Secondary | ICD-10-CM | POA: Diagnosis present

## 2012-09-24 DIAGNOSIS — J449 Chronic obstructive pulmonary disease, unspecified: Secondary | ICD-10-CM | POA: Diagnosis present

## 2012-09-24 DIAGNOSIS — I509 Heart failure, unspecified: Secondary | ICD-10-CM | POA: Diagnosis present

## 2012-09-24 DIAGNOSIS — Z91040 Latex allergy status: Secondary | ICD-10-CM

## 2012-09-24 DIAGNOSIS — IMO0001 Reserved for inherently not codable concepts without codable children: Secondary | ICD-10-CM | POA: Diagnosis present

## 2012-09-24 DIAGNOSIS — I2510 Atherosclerotic heart disease of native coronary artery without angina pectoris: Secondary | ICD-10-CM | POA: Diagnosis present

## 2012-09-24 DIAGNOSIS — I2109 ST elevation (STEMI) myocardial infarction involving other coronary artery of anterior wall: Secondary | ICD-10-CM

## 2012-09-24 DIAGNOSIS — R739 Hyperglycemia, unspecified: Secondary | ICD-10-CM | POA: Diagnosis present

## 2012-09-24 DIAGNOSIS — Z7902 Long term (current) use of antithrombotics/antiplatelets: Secondary | ICD-10-CM

## 2012-09-24 DIAGNOSIS — E782 Mixed hyperlipidemia: Secondary | ICD-10-CM

## 2012-09-24 DIAGNOSIS — Z9889 Other specified postprocedural states: Secondary | ICD-10-CM

## 2012-09-24 DIAGNOSIS — R7309 Other abnormal glucose: Secondary | ICD-10-CM

## 2012-09-24 DIAGNOSIS — I5022 Chronic systolic (congestive) heart failure: Secondary | ICD-10-CM

## 2012-09-24 DIAGNOSIS — Z823 Family history of stroke: Secondary | ICD-10-CM

## 2012-09-24 DIAGNOSIS — G459 Transient cerebral ischemic attack, unspecified: Secondary | ICD-10-CM

## 2012-09-24 DIAGNOSIS — I10 Essential (primary) hypertension: Secondary | ICD-10-CM | POA: Diagnosis present

## 2012-09-24 HISTORY — DX: Unspecified osteoarthritis, unspecified site: M19.90

## 2012-09-24 HISTORY — DX: Anxiety disorder, unspecified: F41.9

## 2012-09-24 HISTORY — DX: Fibromyalgia: M79.7

## 2012-09-24 LAB — GLUCOSE, CAPILLARY: Glucose-Capillary: 176 mg/dL — ABNORMAL HIGH (ref 70–99)

## 2012-09-24 MED ORDER — ENALAPRIL MALEATE 5 MG PO TABS
5.0000 mg | ORAL_TABLET | Freq: Two times a day (BID) | ORAL | Status: DC
  Administered 2012-09-24 – 2012-09-29 (×9): 5 mg via ORAL
  Filled 2012-09-24 (×11): qty 1

## 2012-09-24 MED ORDER — ONDANSETRON HCL 4 MG/2ML IJ SOLN: 4.0000 mg | Freq: Four times a day (QID) | INTRAMUSCULAR | Status: DC | PRN

## 2012-09-24 MED ORDER — ONDANSETRON HCL 4 MG PO TABS
4.0000 mg | ORAL_TABLET | Freq: Four times a day (QID) | ORAL | Status: DC | PRN
  Administered 2012-09-25 (×2): 4 mg via ORAL
  Filled 2012-09-24 (×2): qty 1

## 2012-09-24 MED ORDER — PNEUMOCOCCAL VAC POLYVALENT 25 MCG/0.5ML IJ INJ
0.5000 mL | INJECTION | INTRAMUSCULAR | Status: AC
  Administered 2012-09-25: 0.5 mL via INTRAMUSCULAR
  Filled 2012-09-24: qty 0.5

## 2012-09-24 MED ORDER — CLOPIDOGREL BISULFATE 75 MG PO TABS
75.0000 mg | ORAL_TABLET | Freq: Every day | ORAL | Status: DC
  Administered 2012-09-24 – 2012-09-29 (×6): 75 mg via ORAL
  Filled 2012-09-24 (×7): qty 1

## 2012-09-24 MED ORDER — HEPARIN BOLUS VIA INFUSION
2000.0000 [IU] | INTRAVENOUS | Status: DC
  Filled 2012-09-24: qty 2000

## 2012-09-24 MED ORDER — ACETAMINOPHEN 650 MG RE SUPP: 650.0000 mg | Freq: Four times a day (QID) | RECTAL | Status: DC | PRN

## 2012-09-24 MED ORDER — WARFARIN - PHARMACIST DOSING INPATIENT
Freq: Every day | Status: DC
  Administered 2012-09-25: 18:00:00

## 2012-09-24 MED ORDER — WARFARIN SODIUM 7.5 MG PO TABS
7.5000 mg | ORAL_TABLET | Freq: Once | ORAL | Status: AC
  Filled 2012-09-24: qty 1

## 2012-09-24 MED ORDER — ZOLPIDEM TARTRATE 10 MG PO TABS
5.0000 mg | ORAL_TABLET | Freq: Every evening | ORAL | Status: DC | PRN
  Filled 2012-09-24: qty 1

## 2012-09-24 MED ORDER — INSULIN ASPART 100 UNIT/ML ~~LOC~~ SOLN
0.0000 [IU] | Freq: Three times a day (TID) | SUBCUTANEOUS | Status: DC
  Administered 2012-09-24: 2 [IU] via SUBCUTANEOUS
  Administered 2012-09-25 – 2012-09-26 (×3): 1 [IU] via SUBCUTANEOUS
  Administered 2012-09-28: 2 [IU] via SUBCUTANEOUS
  Administered 2012-09-29: 1 [IU] via SUBCUTANEOUS

## 2012-09-24 MED ORDER — ACETAMINOPHEN 325 MG PO TABS
650.0000 mg | ORAL_TABLET | Freq: Four times a day (QID) | ORAL | Status: DC | PRN
  Filled 2012-09-24: qty 2

## 2012-09-24 MED ORDER — ZOLPIDEM TARTRATE 5 MG PO TABS
5.0000 mg | ORAL_TABLET | Freq: Every evening | ORAL | Status: DC | PRN
  Administered 2012-09-24: 5 mg via ORAL
  Filled 2012-09-24: qty 1

## 2012-09-24 MED ORDER — INFLUENZA VIRUS VACC SPLIT PF IM SUSP
0.5000 mL | INTRAMUSCULAR | Status: AC
  Administered 2012-09-25: 0.5 mL via INTRAMUSCULAR
  Filled 2012-09-24: qty 0.5

## 2012-09-24 MED ORDER — HYDROCODONE-ACETAMINOPHEN 5-500 MG PO TABS
1.0000 | ORAL_TABLET | Freq: Two times a day (BID) | ORAL | Status: DC | PRN
  Filled 2012-09-24: qty 1

## 2012-09-24 MED ORDER — INSULIN ASPART 100 UNIT/ML ~~LOC~~ SOLN: 0.0000 [IU] | Freq: Three times a day (TID) | SUBCUTANEOUS | Status: DC

## 2012-09-24 MED ORDER — HYDROCODONE-ACETAMINOPHEN 5-325 MG PO TABS
ORAL_TABLET | ORAL | Status: AC
  Administered 2012-09-24: 1
  Filled 2012-09-24: qty 1

## 2012-09-24 MED ORDER — CARVEDILOL 6.25 MG PO TABS
9.3750 mg | ORAL_TABLET | Freq: Two times a day (BID) | ORAL | Status: DC
  Administered 2012-09-25 – 2012-09-29 (×8): 9.375 mg via ORAL
  Filled 2012-09-24 (×11): qty 1

## 2012-09-24 MED ORDER — ALPRAZOLAM 0.5 MG PO TABS
ORAL_TABLET | ORAL | Status: AC
  Filled 2012-09-24: qty 1

## 2012-09-24 MED ORDER — FUROSEMIDE 40 MG PO TABS
40.0000 mg | ORAL_TABLET | Freq: Every day | ORAL | Status: DC | PRN
  Filled 2012-09-24: qty 1

## 2012-09-24 MED ORDER — HEPARIN (PORCINE) IN NACL 100-0.45 UNIT/ML-% IJ SOLN
1650.0000 [IU]/kg/h | INTRAMUSCULAR | Status: DC
  Administered 2012-09-24 (×2): 16 [IU]/kg/h via INTRAVENOUS
  Filled 2012-09-24 (×24): qty 250

## 2012-09-24 MED ORDER — HYDROCODONE-ACETAMINOPHEN 5-325 MG PO TABS
1.0000 | ORAL_TABLET | Freq: Two times a day (BID) | ORAL | Status: DC | PRN
Start: 2012-09-24 — End: 2012-09-25

## 2012-09-24 MED ORDER — NITROGLYCERIN 0.4 MG SL SUBL: 0.4000 mg | SUBLINGUAL_TABLET | SUBLINGUAL | Status: DC | PRN

## 2012-09-24 MED ORDER — ALPRAZOLAM 0.5 MG PO TABS
0.5000 mg | ORAL_TABLET | Freq: Two times a day (BID) | ORAL | Status: DC | PRN
  Administered 2012-09-24 – 2012-09-29 (×10): 0.5 mg via ORAL
  Filled 2012-09-24 (×9): qty 1

## 2012-09-24 NOTE — Evaluation (Signed)
Physical Therapy Evaluation Patient Details Name: [name] MRN: [id number] DOB: [birth year] Today's Date: 09/24/2012 Time: 0865-7846 PT Time Calculation (min): 19 min  PT Assessment / Plan / Recommendation Clinical Impression  Patient is a 51 yo female admitted with transient symptoms of RUE weakness and speech difficulties.  Patient reports she feels back at baseline.  Patient independent with all mobility and gait.  Scored 24/24 on DGI balance assessment.  No acute PT needs identified - PT will sign off.    PT Assessment  Patent does not need any further PT services    Follow Up Recommendations  No PT follow up;Supervision - Intermittent    Does the patient have the potential to tolerate intense rehabilitation      Barriers to Discharge        Equipment Recommendations  None recommended by PT    Recommendations for Other Services     Frequency      Precautions / Restrictions Precautions Precautions: None Restrictions Weight Bearing Restrictions: No   Pertinent Vitals/Pain       Mobility  Bed Mobility Bed Mobility: Supine to Sit Supine to Sit: 7: Independent Details for Bed Mobility Assistance: No cues or assist needed Transfers Transfers: Sit to Stand;Stand to Sit Sit to Stand: 7: Independent;With upper extremity assist;From bed Stand to Sit: 7: Independent;With upper extremity assist;To bed Details for Transfer Assistance: Safe technique used.  No cues or assist needed Ambulation/Gait Ambulation/Gait Assistance: 7: Independent Ambulation Distance (Feet): 300 Feet Assistive device: None Ambulation/Gait Assistance Details: Patient with good gait pattern, good balance.  No assist needed. Gait Pattern: Within Functional Limits Gait velocity: WFL Stairs: Yes Stairs Assistance: 7: Independent Stair Management Technique: No rails;Alternating pattern;Forwards Number of Stairs: 5  Modified Rankin (Stroke Patients Only) Pre-Morbid Rankin Score: No  symptoms Modified Rankin: No symptoms      PT Goals  N/A  Visit Information  Last PT Received On: 09/24/12 Assistance Needed: +1    Subjective Data  Subjective: "I feel back to normal" Patient Stated Goal: To go home soon   Prior Functioning  Home Living Lives With: Family (Sister, brother-in-law, daughter) Available Help at Discharge: Family;Available PRN/intermittently Type of Home: Mobile home Home Access: Stairs to enter Entrance Stairs-Number of Steps: 3 Entrance Stairs-Rails: Right;Left Home Layout: One level Bathroom Shower/Tub: Engineer, manufacturing systems: Standard Home Adaptive Equipment: None Prior Function Level of Independence: Independent Able to Take Stairs?: Yes Driving: Yes Vocation: On disability Communication Communication: No difficulties Dominant Hand: Right    Cognition  Overall Cognitive Status: Appears within functional limits for tasks assessed/performed Arousal/Alertness: Awake/alert Orientation Level: Oriented X4 / Intact Behavior During Session: [facility] for tasks performed    Extremity/Trunk Assessment Right Upper Extremity Assessment RUE ROM/Strength/Tone: Within functional levels RUE Sensation: WFL - Light Touch Left Upper Extremity Assessment LUE ROM/Strength/Tone: Within functional levels LUE Sensation: WFL - Light Touch Right Lower Extremity Assessment RLE ROM/Strength/Tone: Within functional levels RLE Sensation: WFL - Light Touch Left Lower Extremity Assessment LLE ROM/Strength/Tone: Within functional levels LLE Sensation: WFL - Light Touch Trunk Assessment Trunk Assessment: Normal   Balance Balance Balance Assessed: Yes Standardized Balance Assessment Standardized Balance Assessment: Dynamic Gait Index Dynamic Gait Index Level Surface: Normal Change in Gait Speed: Normal Gait with Horizontal Head Turns: Normal Gait with Vertical Head Turns: Normal Gait and Pivot Turn: Normal Step Over Obstacle: Normal Step Around  Obstacles: Normal Steps: Normal Total Score: 24   End of Session PT - End of Session Equipment Utilized During Treatment:  Gait belt Activity Tolerance: Patient tolerated treatment well Patient left: in bed;with call bell/phone within reach;with family/visitor present (sitting on bed) Nurse Communication: Mobility status  GP     Vena Austria 09/24/2012, 6:46 PM Durenda Hurt. Renaldo Fiddler, [facility] Acute Rehab Services Pager [phone]

## 2012-09-24 NOTE — Consult Note (Signed)
NEURO HOSPITALIST CONSULT NOTE    Reason for Consult:  Stroke  HPI:                                                                                                                                          [name] is an 51 y.o. female with rheumatic heart disease S/P St. Jude mitral valve in 2007. Who woke up at 6 am at baseline.  Patient went to [facility] and around 10 am she noted she had a hard time expressing herself and noted her right arm would not move. This occurred for for about one hour and then cleared. This occurred 2 other times over a period of 4 hours. At time of initial ED visit at [facility] her INR was 1.2 and initial CT head was negative for acute stroke or bleed. Patient was transferred to [facility] for further neurological evaluation. At present time she is back to her baseline with no neurological deficits.   Past Medical History  Diagnosis Date  . Rheumatic heart disease     s/p St. Jude mitral valve in 2007  . Coronary disease     Minimal nonobstructive by cardiac catheterization prior to her surgery in 2007, ejection fraction of 55% at that time.  Marland Kitchen COPD (chronic obstructive pulmonary disease)   . Hypertension   . History of CHF (congestive heart failure)     Ejection fraction 45% not candidate for ICD.  Marland Kitchen Myocardial infarction, anterior wall      late reperfusion occluded LAD treated with a bare-metal stent complicated by catheter-induced left mainstem dissection and also treated with a bare-metal stent., initial LV function 25-30% followup echocardiogram ejection fraction 45%   . Back pain      chronic    Past Surgical History  Procedure Date  . Cardiac catheterization   . Median sternotomy     for mitral valve replacement (25 mm St. Jude mechanical prosthesis.)    Family History  Problem Relation Age of Onset  . Heart attack Mother 43  . Heart attack Father 64  . Coronary artery disease Sister     Social History:   reports that she has been smoking Cigarettes.  She has a 25 pack-year smoking history. She has never used smokeless tobacco. Her alcohol and drug histories not on file.  Allergies  Allergen Reactions  . Cyclobenzaprine Hcl     REACTION: N/V    MEDICATIONS:  Prior to Admission:  Prescriptions prior to admission  Medication Sig Dispense Refill  . ALPRAZolam (XANAX) 0.5 MG tablet Take 0.5 mg by mouth 2 (two) times daily as needed.        . carvedilol (COREG) 6.25 MG tablet Take 1 1/2 tabs (9.375mg ) twice a day  90 tablet  6  . clopidogrel (PLAVIX) 75 MG tablet Take 75 mg by mouth daily.        . enalapril (VASOTEC) 5 MG tablet Take 1 tablet (5 mg total) by mouth 2 (two) times daily.  60 tablet  6  . furosemide (LASIX) 40 MG tablet Take 40 mg by mouth daily as needed.       Marland Kitchen HYDROcodone-acetaminophen (VICODIN) 5-500 MG per tablet Take 1 tablet by mouth 2 (two) times daily as needed.        . nitroGLYCERIN (NITROSTAT) 0.4 MG SL tablet Place 1 tablet (0.4 mg total) under the tongue every 5 (five) minutes as needed.  25 tablet  2  . promethazine (PHENERGAN) 25 MG tablet Take 1 tablet by mouth Twice daily as needed.      . warfarin (COUMADIN) 5 MG tablet Take 5 mg by mouth daily. As directed per [facility] office       . zolpidem (AMBIEN) 10 MG tablet Take 10 mg by mouth at bedtime.       Scheduled:   . influenza  inactive virus vaccine  0.5 mL Intramuscular Tomorrow-1000  . pneumococcal 23 valent vaccine  0.5 mL Intramuscular Tomorrow-1000     ROS:                                                                                                                                       History obtained from patient  General ROS: negative for - chills, fatigue, fever, night sweats, weight gain or weight loss Psychological ROS: negative for - behavioral disorder, hallucinations, memory  difficulties, mood swings or suicidal ideation Ophthalmic ROS: negative for - blurry vision, double vision, eye pain or loss of vision ENT ROS: negative for - epistaxis, nasal discharge, oral lesions, sore throat, tinnitus or vertigo Allergy and Immunology ROS: negative for - hives or itchy/watery eyes Hematological and Lymphatic ROS: negative for - bleeding problems, bruising or swollen lymph nodes Endocrine ROS: negative for - galactorrhea, hair pattern changes, polydipsia/polyuria or temperature intolerance Respiratory ROS: negative for - cough, hemoptysis, shortness of breath or wheezing Cardiovascular ROS: negative for - chest pain, dyspnea on exertion, edema or irregular heartbeat Gastrointestinal ROS: negative for - abdominal pain, diarrhea, hematemesis, nausea/vomiting or stool incontinence Genito-Urinary ROS: negative for - dysuria, hematuria, incontinence or urinary frequency/urgency Musculoskeletal ROS: negative for - as noted in HPI Neurological ROS: as noted in HPI Dermatological ROS: negative for rash and skin lesion changes   Blood pressure 147/87, pulse 100, temperature 98.1 F (36.7 C), temperature source Oral, resp. rate 18, SpO2 98.00%.  Neurologic Examination:                                                                                                       Mental Status: Alert, oriented, thought content appropriate.  Speech fluent without evidence of aphasia.  Able to follow 3 step commands without difficulty. Cranial Nerves: II: Discs flat bilaterally; Visual fields grossly normal, pupils equal, round, reactive to light and accommodation III,IV, VI: ptosis not present, extra-ocular motions intact bilaterally V,VII: smile symmetric, facial light touch sensation normal bilaterally VIII: hearing normal bilaterally IX,X: gag reflex present XI: bilateral shoulder shrug XII: midline tongue extension Motor: Right : Upper extremity   5/5    Left:     Upper extremity    5/5  Lower extremity   5/5     Lower extremity   5/5 Tone and bulk:normal tone throughout; no atrophy noted Sensory: Pinprick and light touch intact throughout, bilaterally Deep Tendon Reflexes: 2+ and symmetric throughout Plantars: Right: downgoing   Left: downgoing Cerebellar: normal finger-to-nose,  normal heel-to-shin test CV: pulses palpable throughout    Lab Results  Component Value Date/Time   CHOL  Value: 269        ATP III CLASSIFICATION:  <200     mg/dL   Desirable  161-096  mg/dL   Borderline High  >=045    mg/dL   High       * 03/02/8118  3:50 AM    No results found for this or any previous visit (from the past 48 hour(s)).  No results found.   Assessment/Plan: 51 y.o. female with rheumatic heart disease S/P St. Jude mitral valve in 2007 on chronic coumadin with sub therapeutic INR of 1.2.  Patient experienced sudden onset of right arm numbness and plegia along with expressive aphasia lasting about 1 hour X 3 episodes. Currently she is back to her baseline showing no neurological deficits. Given her multiple aphasic and right upper extremity paretic episodes further stroke is very worrisome. Due to this we have consulted pharmacy and start heparin 16 Units/Kg/mL to bridge while INR becomes therapeutic.   If her MRI does not show stroke, then a bolus dose could be given if needed.   Recommend: 1) Heparin 16 Units/Kg/mL to bridge while INR becomes therapeutic.  2) MRI/MRA head 3) 2 D echo 4) Carotid Doppler 5) Pharmacy to follow both Heparin and Coumadin 6) FLP, A1c   Felicie Morn PA-C [facility] [phone]  09/24/2012, 3:12 PM   I have seen and evaluated the patient. I have reviewed the above note and made appropriate changes. Likely had partial MCA occlusion with auto-reperfusion. She likely has a small area of infarct given the duration of her symptoms and therefore we will not bolus heparin at this time, if her MRI is negative for acute infarct, then  a heparin bolus could be given if needed.   This is a difficult situation and the risk/benefit ratio of starting anticoagulation is not optimal. She likely has a small infarct, if any, and mechanical heart valves are at high risk of  embolus. Therefore I feel that going ahead with anticoagulation to be the prudent decision. I have discussed the risks with the patient.   Ritta Slot, MD [facility] [phone]  If 7pm- 7am, please page neurology on call at [phone].

## 2012-09-24 NOTE — H&P (Signed)
[facility] History and Physical  [name] ZOX:[id number] DOB: 29-Jul-1961 DOA: 09/24/2012  Referring physician:  PCP: Ignatius Specking., MD  Specialists: Dr. Amada Jupiter  Chief Complaint: Speech difficulty and right arm numbness  HPI: [name] is a 51 y.o. female with past medical history significant for status post St. Jude mitral valve in 2007 for rheumatic heart disease, nonobstructive coronary artery disease, hypertension, CHF who presents with above complaints. She states she was doing well and today until after about 10 AM when she went into the [employer] and family noted that she was having difficulty expressing herself, and she also had numbness in her right upper extremity. She states she went to the [facility] ED and after she arrived there her symptoms are resolved, but recurred 2 more times over about a 4 hour period. As the [facility] ED a CT scan was done and showed no acute infarct, Her INR was subtherapeutic at 1.2. Also the chemistries today revealed a blood glucose of 250 (she has no prior history of diabetes), with a creatinine of 1.31 and white cell count of 14.3. Neuro was consulted per [facility] M.D. and transfer to the [facility] requested. At the time of my exam patient is fluent, and has no right arm numbness/paresthesias. She admits to headaches in the past several weeks which she attributes to stress, she denies blurry vision, dysphagia, and no focal weakness.   Review of Systems: The patient denies anorexia, fever, weight loss,, vision loss, decreased hearing, hoarseness, chest pain, syncope, dyspnea on exertion, peripheral edema, balance deficits, hemoptysis, abdominal pain, melena, hematochezia, severe indigestion/heartburn, hematuria, incontinence, genital sores, muscle weakness, suspicious skin lesions, transient blindness, difficulty walking, depression, unusual weight change, abnormal bleeding, enlarged lymph nodes.    Past Medical  History  Diagnosis Date  . Rheumatic heart disease     s/p St. Jude mitral valve in 2007  . Coronary disease     Minimal nonobstructive by cardiac catheterization prior to her surgery in 2007, ejection fraction of 55% at that time.  Marland Kitchen COPD (chronic obstructive pulmonary disease)   . Hypertension   . History of CHF (congestive heart failure)     Ejection fraction 45% not candidate for ICD.  Marland Kitchen Myocardial infarction, anterior wall      late reperfusion occluded LAD treated with a bare-metal stent complicated by catheter-induced left mainstem dissection and also treated with a bare-metal stent., initial LV function 25-30% followup echocardiogram ejection fraction 45%   . Back pain      chronic  . CHF (congestive heart failure)   . Coronary artery disease   . Anxiety   . Shortness of breath   . Stroke 09/24/2012    TIA  . Headache   . Arthritis   . Fibromyalgia    Past Surgical History  Procedure Date  . Cardiac catheterization   . Median sternotomy     for mitral valve replacement (25 mm St. Jude mechanical prosthesis.)  . Appendectomy    Social History:  reports that she has been smoking Cigarettes.  She has a 25 pack-year smoking history. She has never used smokeless tobacco. She reports that she does not drink alcohol or use illicit drugs. where does patient live--home  Can patient participate in ADLs-yes  Allergies  Allergen Reactions  . Cyclobenzaprine Hcl Nausea And Vomiting  . Latex     Family History  Problem Relation Age of Onset  . Heart attack Mother 24  . Heart attack Father 4  .  Coronary artery disease Sister    her father also had strokes  Prior to Admission medications   Medication Sig Start Date End Date Taking? Authorizing Provider  ALPRAZolam Prudy Feeler) 0.5 MG tablet Take 0.5 mg by mouth 2 (two) times daily as needed.     Yes Historical Provider, MD  carvedilol (COREG) 6.25 MG tablet Take 9.375 mg by mouth 2 (two) times daily with a meal.   Yes  Historical Provider, MD  clopidogrel (PLAVIX) 75 MG tablet Take 75 mg by mouth daily.     Yes Historical Provider, MD  enalapril (VASOTEC) 5 MG tablet Take 1 tablet (5 mg total) by mouth 2 (two) times daily. 09/12/11  Yes June Leap, MD  furosemide (LASIX) 40 MG tablet Take 40 mg by mouth daily as needed. For sweling   Yes Historical Provider, MD  HYDROcodone-acetaminophen (VICODIN) 5-500 MG per tablet Take 1 tablet by mouth 2 (two) times daily as needed. For pain   Yes Historical Provider, MD  nitroGLYCERIN (NITROSTAT) 0.4 MG SL tablet Place 0.4 mg under the tongue every 5 (five) minutes as needed. For chest pain   Yes Historical Provider, MD  promethazine (PHENERGAN) 25 MG tablet Take 1 tablet by mouth Twice daily as needed. 02/26/12  Yes Historical Provider, MD  Pseudoeph-Doxylamine-DM-APAP (NYQUIL PO) Take 1 tablet by mouth at bedtime as needed. For sleep   Yes Historical Provider, MD  warfarin (COUMADIN) 5 MG tablet Take 5 mg by mouth daily. As directed per [facility] office    Yes Historical Provider, MD  zolpidem (AMBIEN) 10 MG tablet Take 10 mg by mouth at bedtime. For sleep   Yes Historical Provider, MD   Physical Exam: Filed Vitals:   09/24/12 1426 09/24/12 1500  BP: 147/87   Pulse: 100   Temp: 98.1 F (36.7 C)   TempSrc: Oral   Resp: 18   Weight:  92 kg (202 lb 13.2 oz)  SpO2: 98%     Constitutional: Vital signs reviewed.  Patient is a well-developed and well-nourished  in no acute distress and cooperative with exam. Alert and oriented x3.  Head: Normocephalic and atraumatic Mouth: no erythema or exudates, MMM Eyes: PERRL, EOMI, conjunctivae normal, No scleral icterus.  Neck: Supple, Trachea midline normal ROM, No JVD, mass, thyromegaly, or carotid bruit present.  Cardiovascular: RRR, S1 normal, S2 normal, no MRG, pulses symmetric and intact bilaterally Pulmonary/Chest: Moderate air movement,, no wheezes, rales, or rhonchi Abdominal: Soft. Non-tender, non-distended, bowel sounds  are normal, no masses, organomegaly, or guarding present.  GU: no CVA tenderness extremities: No cyanosis and no edema  Neurological: A&O x3, Strength is normal and symmetric bilaterally, cranial nerve II-XII are grossly intact, no focal motor deficit, sensory intact to light touch bilaterally.  Skin: Warm, dry and intact. No rash, cyanosis, or clubbing.  Psychiatric: Normal mood and affect. speech and behavior is normal. Judgment and thought content normal.   Labs on Admission:  Basic Metabolic Panel: No results found for this basename: NA:5,K:5,CL:5,CO2:5,GLUCOSE:5,BUN:5,CREATININE:5,CALCIUM:5,MG:5,PHOS:5 in the last 168 hours Liver Function Tests: No results found for this basename: AST:5,ALT:5,ALKPHOS:5,BILITOT:5,PROT:5,ALBUMIN:5 in the last 168 hours No results found for this basename: LIPASE:5,AMYLASE:5 in the last 168 hours No results found for this basename: AMMONIA:5 in the last 168 hours CBC: No results found for this basename: WBC:5,NEUTROABS:5,HGB:5,HCT:5,MCV:5,PLT:5 in the last 168 hours Cardiac Enzymes: No results found for this basename: CKTOTAL:5,CKMB:5,CKMBINDEX:5,TROPONINI:5 in the last 168 hours  BNP (last 3 results) No results found for this basename: PROBNP:3 in the last  8760 hours CBG: No results found for this basename: GLUCAP:5 in the last 168 hours  Radiological Exams on Admission: No results found.   Assessment/Plan Principal Problem:  *TIA (transient ischemic attack) -As discussed above, CT head had [place] negative. MRA MRI, echo, carotid Doppler as TIA admission protocol, with fasting lipid profile hemoglobin A1c. -Neuro consulted as above, appreciate Dr. Alene Mires assistance-patient started on heparin to bridge till INR therapeutic on coumadin given her mechanical mitral valve. Active Problems:  MITRAL VALVE REPLACEMENT, HX OF, with subtherapeutic INR -Patient started on IV heparin to bridge till INR therapeutic as discussed with neuro.   CAD -She is chest pain free, continue outpatient medications-including the Plavix as discussed with neuro.  Chronic systolic heart failure -Compensated, she has when necessary Lasix on her home med list but states she has not to use it in a very long time. Leukocytosis -UA at [facility] contaminated, will repeat urinalysis follow. Also repeat CBC in a.m. Elevated blood glucose -Without prior history of diabetes, obtain hemoglobin A1c, monitor Accu-Cheks cover with sliding scale and follow.  Code Status: full Family Communication: Directly with patient at bedside Disposition Plan: Admit to neuro/telemetry  Time spent: >38mins  Kela Millin [facility] Pager [phone]  If 7PM-7AM, please contact night-coverage [email or web address] Password [facility] 09/24/2012, 5:36 PM

## 2012-09-24 NOTE — Progress Notes (Addendum)
ANTICOAGULATION CONSULT NOTE - Initial Consult  Pharmacy Consult for Heparin / Coumadin Indication: St. Jude MVR, stroke like symptoms  Allergies  Allergen Reactions  . Cyclobenzaprine Hcl Nausea And Vomiting    Patient Measurements: Weight: 202 lb 13.2 oz (92 kg)  Labs: No results found for this basename: HGB:2,HCT:3,PLT:3,APTT:3,LABPROT:3,INR:3,HEPARINUNFRC:3,CREATININE:3,CKTOTAL:3,CKMB:3,TROPONINI:3 in the last 72 hours  The CrCl is unknown because both a height and weight (above a minimum accepted value) are required for this calculation.   Medical History: Past Medical History  Diagnosis Date  . Rheumatic heart disease     s/p St. Jude mitral valve in 2007  . Coronary disease     Minimal nonobstructive by cardiac catheterization prior to her surgery in 2007, ejection fraction of 55% at that time.  Marland Kitchen COPD (chronic obstructive pulmonary disease)   . Hypertension   . History of CHF (congestive heart failure)     Ejection fraction 45% not candidate for ICD.  Marland Kitchen Myocardial infarction, anterior wall      late reperfusion occluded LAD treated with a bare-metal stent complicated by catheter-induced left mainstem dissection and also treated with a bare-metal stent., initial LV function 25-30% followup echocardiogram ejection fraction 45%   . Back pain      chronic   Assessment: 51 year old on Coumadin for St. Jude mitral valve done in 2007.  Admitted with CVA like symptoms.  MD has started heparin due to risk of embolic stroke much greater than risk of hemorrhagic stroke.  INR = 1.3  Coumadin dose PTA = 5 mg daily  Goal of Therapy:  Heparin level = 0.3 to 0.5 INR = 2.5 to 3.5 Monitor platelets by anticoagulation protocol: Yes   Plan:  1) Per MD, heparin drip started at 1450 units / hr 2) Coumadin 7.5 mg po x 1  3) Heparin level 8 hours after heparin begins 4) Daily heparin level, CBC, INR  Thank you. Okey Regal, PharmD [phone]  09/24/2012,4:18 PM

## 2012-09-25 DIAGNOSIS — G459 Transient cerebral ischemic attack, unspecified: Secondary | ICD-10-CM

## 2012-09-25 DIAGNOSIS — R4701 Aphasia: Secondary | ICD-10-CM

## 2012-09-25 DIAGNOSIS — E782 Mixed hyperlipidemia: Secondary | ICD-10-CM

## 2012-09-25 LAB — LIPID PANEL
HDL: 28 mg/dL — ABNORMAL LOW (ref >39)
LDL Cholesterol: 170 mg/dL — ABNORMAL HIGH (ref 0–99)
Total CHOL/HDL Ratio: 8.5 RATIO
Triglycerides: 200 mg/dL — ABNORMAL HIGH (ref <150)

## 2012-09-25 LAB — BASIC METABOLIC PANEL
BUN: 12 mg/dL (ref 6–23)
Chloride: 100 mEq/L (ref 96–112)
GFR calc Af Amer: >90 mL/min (ref >90)
GFR calc non Af Amer: 80 mL/min — ABNORMAL LOW (ref >90)
Potassium: 3.6 mEq/L (ref 3.5–5.1)
Sodium: 138 mEq/L (ref 135–145)

## 2012-09-25 LAB — GLUCOSE, CAPILLARY
Glucose-Capillary: 142 mg/dL — ABNORMAL HIGH (ref 70–99)
Glucose-Capillary: 110 mg/dL — ABNORMAL HIGH (ref 70–99)
Glucose-Capillary: 135 mg/dL — ABNORMAL HIGH (ref 70–99)

## 2012-09-25 LAB — RAPID URINE DRUG SCREEN, HOSP PERFORMED
Barbiturates: NOT DETECTED
Benzodiazepines: POSITIVE — AB
Cocaine: NOT DETECTED

## 2012-09-25 LAB — URINALYSIS, ROUTINE W REFLEX MICROSCOPIC
Bilirubin Urine: NEGATIVE
Leukocytes, UA: NEGATIVE
Nitrite: NEGATIVE
Specific Gravity, Urine: 1.024 (ref 1.005–1.030)
Urobilinogen, UA: 0.2 mg/dL (ref 0.0–1.0)
pH: 5 (ref 5.0–8.0)

## 2012-09-25 LAB — HEPARIN LEVEL (UNFRACTIONATED)
Heparin Unfractionated: 0.7 IU/mL (ref 0.30–0.70)
Heparin Unfractionated: 0.79 IU/mL — ABNORMAL HIGH (ref 0.30–0.70)

## 2012-09-25 LAB — CBC
HCT: 47.9 % — ABNORMAL HIGH (ref 36.0–46.0)
Hemoglobin: 16.3 g/dL — ABNORMAL HIGH (ref 12.0–15.0)
RBC: 4.94 MIL/uL (ref 3.87–5.11)
WBC: 9.2 10*3/uL (ref 4.0–10.5)

## 2012-09-25 LAB — URINE MICROSCOPIC-ADD ON

## 2012-09-25 LAB — PROTIME-INR: INR: 1.49 (ref 0.00–1.49)

## 2012-09-25 MED ORDER — ATORVASTATIN CALCIUM 20 MG PO TABS
20.0000 mg | ORAL_TABLET | Freq: Every day | ORAL | Status: DC
  Administered 2012-09-25 – 2012-09-28 (×4): 20 mg via ORAL
  Filled 2012-09-25 (×5): qty 1

## 2012-09-25 MED ORDER — ATORVASTATIN CALCIUM 10 MG PO TABS
10.0000 mg | ORAL_TABLET | Freq: Every day | ORAL | Status: DC
  Filled 2012-09-25: qty 1

## 2012-09-25 MED ORDER — HYDROCODONE-ACETAMINOPHEN 5-325 MG PO TABS
1.0000 | ORAL_TABLET | Freq: Three times a day (TID) | ORAL | Status: DC | PRN
  Administered 2012-09-25 – 2012-09-28 (×7): 1 via ORAL
  Filled 2012-09-25 (×8): qty 1

## 2012-09-25 MED ORDER — HEPARIN (PORCINE) IN NACL 100-0.45 UNIT/ML-% IJ SOLN
1200.0000 [IU]/h | INTRAMUSCULAR | Status: DC
  Administered 2012-09-25: 1350 [IU]/h via INTRAVENOUS
  Administered 2012-09-25: 1500 [IU]/h via INTRAVENOUS
  Administered 2012-09-26 – 2012-09-29 (×5): 1200 [IU]/h via INTRAVENOUS
  Filled 2012-09-25 (×9): qty 250

## 2012-09-25 MED ORDER — WARFARIN SODIUM 7.5 MG PO TABS
7.5000 mg | ORAL_TABLET | Freq: Once | ORAL | Status: AC
  Administered 2012-09-25: 7.5 mg via ORAL
  Filled 2012-09-25: qty 1

## 2012-09-25 NOTE — Progress Notes (Signed)
Pharmacy called to notify for patient's heparin rate to be increased to 16.67ml. Will do so and cont to monitor patient.

## 2012-09-25 NOTE — Progress Notes (Signed)
ANTICOAGULATION CONSULT NOTE - Follow-Up Consult  Pharmacy Consult for Heparin Indication: St. Jude MVR, stroke like symptoms  Allergies  Allergen Reactions  . Cyclobenzaprine Hcl Nausea And Vomiting  . Latex     Patient Measurements: Height: 5\' 5"  (165.1 cm) Weight: 202 lb 13.2 oz (92 kg) IBW/kg (Calculated) : 57  Heparin dosing weight: 78 kg  Labs:  Basename 09/25/12 0832 09/25/12 0622 09/25/12 0040 09/24/12 1627  HGB -- 16.3* -- --  HCT -- 47.9* -- --  PLT -- 250 -- --  APTT -- -- -- --  LABPROT -- 17.6* -- 15.9*  INR -- 1.49 -- 1.30  HEPARINUNFRC 0.70 -- 0.12* --  CREATININE -- 0.84 -- --  CKTOTAL -- -- -- --  CKMB -- -- -- --  TROPONINI -- -- -- --    Estimated Creatinine Clearance: 89.8 ml/min (by C-G formula based on Cr of 0.84).     . heparin 1,650 Units/hr (09/25/12 0259)  . DISCONTD: heparin 16 Units/kg/hr (09/24/12 2314)     Assessment: 51 year old on Coumadin for St. Jude mitral valve done in 2007. Admitted with CVA symptoms. MD wants heparin bridge due to risk of embolic stroke much greater than risk of hemorrhagic stroke. Heparin level now 0.7 after adjustment.  INR remains subtherapeutic.  Goal of Therapy:  Heparin level = 0.3 to 0.5 Monitor platelets by anticoagulation protocol: Yes   Plan:  Decrease Heparin to 1500 units/hr Recheck Heparin level in 6 hours Coumadin 7.5mg  today Daily PT/INR  Estella Husk, Pharm.D., BCPS Clinical Pharmacist  Phone [phone] Pager [phone] 09/25/2012, 12:56 PM

## 2012-09-25 NOTE — Progress Notes (Signed)
ANTICOAGULATION CONSULT NOTE - Follow-Up Consult  Pharmacy Consult for Heparin Indication: St. Jude MVR, stroke like symptoms  Allergies  Allergen Reactions  . Cyclobenzaprine Hcl Nausea And Vomiting  . Latex     Patient Measurements: Height: 5\' 5"  (165.1 cm) Weight: 202 lb 13.2 oz (92 kg) IBW/kg (Calculated) : 57  Heparin dosing weight: 78 kg  Labs:  Basename 09/25/12 2155 09/25/12 0832 09/25/12 0622 09/25/12 0040 09/24/12 1627  HGB -- -- 16.3* -- --  HCT -- -- 47.9* -- --  PLT -- -- 250 -- --  APTT -- -- -- -- --  LABPROT -- -- 17.6* -- 15.9*  INR -- -- 1.49 -- 1.30  HEPARINUNFRC 0.79* 0.70 -- 0.12* --  CREATININE -- -- 0.84 -- --  CKTOTAL -- -- -- -- --  CKMB -- -- -- -- --  TROPONINI -- -- -- -- --    Estimated Creatinine Clearance: 89.8 ml/min (by C-G formula based on Cr of 0.84).     . heparin 1,500 Units/hr (09/25/12 1556)  . DISCONTD: heparin 16 Units/kg/hr (09/24/12 2314)     Assessment: 51 year old on Coumadin for St. Jude mitral valve done in 2007. Admitted with CVA symptoms. MD wants heparin bridge due to risk of embolic stroke much greater than risk of hemorrhagic stroke. Heparin level (0.79) is still above-goal despite rate decrease.   Goal of Therapy:  Heparin level = 0.3 to 0.5 Monitor platelets by anticoagulation protocol: Yes   Plan:  1. Decrease IV heparin to 1350 units/hr 2. Heparin level, CBC in AM  Lorre Munroe, PharmD 09/25/2012, 11:05 PM

## 2012-09-25 NOTE — Evaluation (Addendum)
Occupational Therapy Evaluation and D/C Patient Details Name: [name] MRN: [id number] DOB: 1961-02-12 Today's Date: 09/25/2012 Time: 7829-5621 OT Time Calculation (min): 14 min  OT Assessment / Plan / Recommendation Clinical Impression  Pt admitted with RUE weakness and difficulty speaking.  Pt [facility]ally taken to [facility] when symptoms and then transferred to [facility] for futher neurological evaluation.  Initial CT negative for acute stroke or bleed.  At this time, symptoms have resolved, and pt has returned to baseline level of functioning.  Will sign off.    OT Assessment  Patient does not need any further OT services    Follow Up Recommendations  No OT follow up    Barriers to Discharge      Equipment Recommendations  None recommended by OT    Recommendations for Other Services    Frequency       Precautions / Restrictions Precautions Precautions: None Restrictions Weight Bearing Restrictions: No   Pertinent Vitals/Pain See vitals    ADL  Eating/Feeding: Performed;Independent Where Assessed - Eating/Feeding: Edge of bed Grooming: Performed;Brushing hair;Independent Where Assessed - Grooming: Unsupported sitting Lower Body Dressing: Performed;Independent Where Assessed - Lower Body Dressing: Unsupported sitting Toilet Transfer: Independent;Simulated Toilet Transfer Method: Sit to Barista: Other (comment) (ambulating from bed to chair) Equipment Used:  (none) Transfers/Ambulation Related to ADLs: Independent ADL Comments: Pt is at baseline level.      OT Diagnosis:    OT Problem List:   OT Treatment Interventions:     OT Goals    Visit Information  Last OT Received On: 09/25/12    Subjective Data      Prior Functioning     Home Living Lives With: Family Available Help at Discharge: Family;Available PRN/intermittently Type of Home: Mobile home Home Access: Stairs to enter Entrance Stairs-Number of Steps: 3 Entrance  Stairs-Rails: Right;Left Home Layout: One level Bathroom Shower/Tub: Engineer, manufacturing systems: Standard Home Adaptive Equipment: None Prior Function Level of Independence: Independent Able to Take Stairs?: Yes Driving: Yes Vocation: On disability Comments: Spends alot of time with her 21 yo granddaughter. Communication Communication: No difficulties Dominant Hand: Right         Vision/Perception Vision - Assessment Eye Alignment: Within Functional Limits   Cognition  Overall Cognitive Status: Appears within functional limits for tasks assessed/performed Arousal/Alertness: Awake/alert Orientation Level: Oriented X4 / Intact Behavior During Session: [facility] for tasks performed    Extremity/Trunk Assessment Right Upper Extremity Assessment RUE ROM/Strength/Tone: Within functional levels RUE Sensation: WFL - Light Touch;WFL - Proprioception RUE Coordination: WFL - gross/fine motor Left Upper Extremity Assessment LUE ROM/Strength/Tone: Within functional levels LUE Sensation: WFL - Light Touch;WFL - Proprioception LUE Coordination: WFL - gross/fine motor Trunk Assessment Trunk Assessment: Normal     Mobility Bed Mobility Bed Mobility: Supine to Sit;Sitting - Scoot to Edge of Bed Supine to Sit: 7: Independent Sitting - Scoot to Edge of Bed: 7: Independent Transfers Transfers: Sit to Stand;Stand to Sit Sit to Stand: 7: Independent;From bed;With upper extremity assist Stand to Sit: 7: Independent;To chair/3-in-1;With upper extremity assist     Shoulder Instructions     Exercise     Balance     End of Session OT - End of Session Equipment Utilized During Treatment:  (none) Activity Tolerance: Patient tolerated treatment well Patient left: in chair;with call bell/phone within reach Nurse Communication: Mobility status  GO    09/25/2012 Cipriano Mile OTR/L Pager [phone] Office [phone]  Cipriano Mile 09/25/2012, 9:31 AM

## 2012-09-25 NOTE — Progress Notes (Signed)
UR COMPLETE.   Brooke Oliphant Wise Makhayla Mcmurry, RN, BSN   Phone #[phone] 

## 2012-09-25 NOTE — Progress Notes (Signed)
Stroke Team Progress Note  HISTORY  [name] is an 51 y.o. female with rheumatic heart disease S/P St. Jude mitral valve in 2007. Who woke up at 6 am on 09/24/12 at baseline. Patient went to [facility] and around 10 am she noted she had a hard time expressing herself and noted her right arm would not move. This occurred for for about one hour and then cleared. This occurred 2 other times over a period of 4 hours. At time of initial ED visit at [facility] her INR was 1.2 and initial CT head was negative for acute stroke or bleed. Patient was transferred to [facility] for further neurological evaluation. At present time she is back to her baseline with no neurological deficits  Patient was not a TPA candidate secondary to out of window. She was admitted to the neuro floor for further evaluation and treatment.  SUBJECTIVE  Sitting in chair. Feels like her speech is better. She wants to go home.    OBJECTIVE Most recent Vital Signs: Filed Vitals:   09/24/12 2305 09/25/12 0230 09/25/12 0500 09/25/12 0630  BP: 134/82 124/82  96/57  Pulse: 92 110  94  Temp: 98.1 F (36.7 C) 99 F (37.2 C)  98.4 F (36.9 C)  TempSrc: Oral Oral  Oral  Resp: 18 18  18   Height:   5\' 5"  (1.651 m)   Weight:   92 kg (202 lb 13.2 oz)   SpO2: 99% 100%  94%   CBG (last 3)   Basename 09/25/12 0725 09/24/12 1759  GLUCAP 108* 176*    IV Fluid Intake:     . heparin 1,650 Units/hr (09/25/12 0259)  . DISCONTD: heparin 16 Units/kg/hr (09/24/12 2314)    MEDICATIONS    . ALPRAZolam      . carvedilol  9.375 mg Oral BID WC  . clopidogrel  75 mg Oral Daily  . enalapril  5 mg Oral BID  . HYDROcodone-acetaminophen      . influenza  inactive virus vaccine  0.5 mL Intramuscular Tomorrow-1000  . insulin aspart  0-9 Units Subcutaneous TID WC  . pneumococcal 23 valent vaccine  0.5 mL Intramuscular Tomorrow-1000  . warfarin  7.5 mg Oral ONCE-1800  . Warfarin - Pharmacist Dosing Inpatient   Does not apply  q1800  . DISCONTD: heparin  2,000 Units Intravenous NOW  . DISCONTD: insulin aspart  0-9 Units Subcutaneous TID WC   PRN:  acetaminophen, acetaminophen, ALPRAZolam, furosemide, HYDROcodone-acetaminophen, nitroGLYCERIN, ondansetron (ZOFRAN) IV, ondansetron, zolpidem, DISCONTD: HYDROcodone-acetaminophen, DISCONTD: HYDROcodone-acetaminophen, DISCONTD: zolpidem  Diet:  Cardiac thin liquids Activity:  Bathroom privileges  with assistance DVT Prophylaxis:  heparin  CLINICALLY SIGNIFICANT STUDIES Basic Metabolic Panel:  Lab 09/25/12 1610  NA 138  K 3.6  CL 100  CO2 23  GLUCOSE 114*  BUN 12  CREATININE 0.84  CALCIUM 8.8  MG --  PHOS --    CBC:  Lab 09/25/12 0622  WBC 9.2  NEUTROABS --  HGB 16.3*  HCT 47.9*  MCV 97.0  PLT 250   Coagulation:  Lab 09/25/12 0622 09/24/12 1627  LABPROT 17.6* 15.9*  INR 1.49 1.30    Urinalysis:  Lab 09/25/12 0538  COLORURINE YELLOW  LABSPEC 1.024  PHURINE 5.0  GLUCOSEU NEGATIVE  HGBUR LARGE*  BILIRUBINUR NEGATIVE  KETONESUR 15*  PROTEINUR NEGATIVE  UROBILINOGEN 0.2  NITRITE NEGATIVE  LEUKOCYTESUR NEGATIVE   Lipid Panel    Component Value Date/Time   CHOL 238* 09/25/2012 0622   TRIG 200* 09/25/2012 9604  HDL 28* 09/25/2012 0622   CHOLHDL 8.5 09/25/2012 0622   VLDL 40 09/25/2012 0622   LDLCALC 170* 09/25/2012 0622   HgbA1C  Lab Results  Component Value Date   HGBA1C 6.4* 09/24/2012    Urine Drug Screen:     Component Value Date/Time   LABOPIA POSITIVE* 09/25/2012 0538   COCAINSCRNUR NONE DETECTED 09/25/2012 0538   LABBENZ POSITIVE* 09/25/2012 0538   AMPHETMU NONE DETECTED 09/25/2012 0538   THCU POSITIVE* 09/25/2012 0538   LABBARB NONE DETECTED 09/25/2012 0538    CT of the brain  (negative per outside report)  MRI of the brain--- tiny foci of acute infarction.   MRA of the brain---  2D Echocardiogram---   Carotid Doppler---    CXR      Therapy Recommendations PT - (no f/u); OT - ; ST -   Physical Exam   Awake, Alert  interactive and appropriate.  CN: EOMI, PERRL, VFF Motor: 5/5 throughout.  Sensory: Intact to light touch Cerebeller: intact FNF    ASSESSMENT Ms. [name] is a 51 y.o. female presenting with right arm numbness/plegia and expressive aphasia on three different episodes, each about an hour long. MR imaging pending.  Work up underway. On coumadin and clopidogrel 75 mg orally every day prior to admission. Now on coumadin and clopidogrel 75 mg orally every day and heparin for secondary stroke prevention. Her INR is subtherapeutic. Patient with resultant right arm numbness/plegia and expressive aphasia.     right arm numbness/plegia and expressive aphasia.  Hyperlipidemia  Polysubstance abuse  Rheumatic heart disease, s/p St Jude 2007-coumadin  Non-obstructive CAD 2007  Hypertension  Sub therapeutic INR with symptoms  Hospital day # 1  TREATMENT/PLAN  Continue clopidogrel 75 mg orally every day, warfarin and heparin for secondary stroke prevention.  Substance abuse counseling  Risk factor modification  Therapy Evaluations  Ambulate with assistance  Lipitor 20 mg daily  Job Founds, [place], [place] [facility] Pager [phone]    I have seen and evaluated the patient. I have reviewed the above note and made appropriate changes. She has three tiny areas of infarction, but with her mechanical valve, I would favor continuing anticoagulation as I feel that the risk of ischemic stroke without heparin outweighs the risk of hemorrhage with heparin.   Ritta Slot, MD [facility] [phone]  If 7pm- 7am, please page neurology on call at [phone].

## 2012-09-25 NOTE — Progress Notes (Signed)
[facility] PROGRESS NOTE  [name]:[id number] DOB: December 22, 1960 DOA: 09/24/2012 PCP: Ignatius Specking., MD  Assessment/Plan: *TIA (transient ischemic attack)  -As discussed above, CT head had [place] negative. MRA MRI, echo, carotid Doppler as TIA admission protocol, with fasting lipid profile hemoglobin A1c.  -Neuro consulted as above, appreciate Dr. Alene Mires assistance-patient started on heparin to bridge till INR therapeutic on coumadin given her mechanical mitral valve.  - INR trending up and last value 1.49.  Coumadin per pharmacy consult - Echocardiogram pending  Active Problems:  MITRAL VALVE REPLACEMENT, HX OF, with subtherapeutic INR  -Patient started on IV heparin to bridge till INR therapeutic as discussed with neuro.   CAD  -She is chest pain free, continue outpatient medications-including the Plavix as discussed with neuro.   Chronic systolic heart failure  -Compensated, she has when necessary Lasix on her home med list but states she has not to use it in a very long time.   Leukocytosis  - Resolved and may have been 2ary to stress demargination - U/A on repeat without nitrites or leukocytosis. No complaints of dysuria.  Elevated blood glucose  - Patient prediabetic according to last hgbA1c of 6.4. - Place on diabetic diet and monitor blood sugars. - Continue SSI   Code Status: full Family Communication: Spoke with patient and family in room Disposition Plan: Pending further work up and recommendations.   Consultants:  Neurology: Dr. Amada Jupiter  Procedures:  Please refer to chart  Antibiotics:  None  HPI/Subjective: No new complaints this am.  No new focal neurological weaknesses reported.  Objective: Filed Vitals:   09/25/12 0230 09/25/12 0500 09/25/12 0630 09/25/12 1024  BP: 124/82  96/57 106/71  Pulse: 110  94 81  Temp: 99 F (37.2 C)  98.4 F (36.9 C) 97.8 F (36.6 C)  TempSrc: Oral  Oral Oral  Resp: 18  18 18     Height:  5\' 5"  (1.651 m)    Weight:  92 kg (202 lb 13.2 oz)    SpO2: 100%  94% 97%    Intake/Output Summary (Last 24 hours) at 09/25/12 1258 Last data filed at 09/25/12 0700  Gross per 24 hour  Intake    800 ml  Output      0 ml  Net    800 ml   Filed Weights   09/24/12 1500 09/25/12 0500  Weight: 92 kg (202 lb 13.2 oz) 92 kg (202 lb 13.2 oz)    Exam:   General:  Pt in NAD, A and O x 3  Cardiovascular: RRR, no rubs  Respiratory: CTA BL, no wheezes  Abdomen: soft, NT  Neuro: GL grip strength equal, answers questions appropriately  Data Reviewed: Basic Metabolic Panel:  Lab 09/25/12 8119  NA 138  K 3.6  CL 100  CO2 23  GLUCOSE 114*  BUN 12  CREATININE 0.84  CALCIUM 8.8  MG --  PHOS --   Liver Function Tests: No results found for this basename: AST:5,ALT:5,ALKPHOS:5,BILITOT:5,PROT:5,ALBUMIN:5 in the last 168 hours No results found for this basename: LIPASE:5,AMYLASE:5 in the last 168 hours No results found for this basename: AMMONIA:5 in the last 168 hours CBC:  Lab 09/25/12 0622  WBC 9.2  NEUTROABS --  HGB 16.3*  HCT 47.9*  MCV 97.0  PLT 250   Cardiac Enzymes: No results found for this basename: CKTOTAL:5,CKMB:5,CKMBINDEX:5,TROPONINI:5 in the last 168 hours BNP (last 3 results) No results found for this basename: PROBNP:3 in the last 8760 hours CBG:  Lab 09/25/12  0725 09/24/12 1759  GLUCAP 108* 176*    No results found for this or any previous visit (from the past 240 hour(s)).   Studies: Mr [name] WU Contrast  09/25/2012  *RADIOLOGY REPORT*  Clinical Data:  Speech difficulty.  Right arm numbness.  History of mitral valve replacement,  history of chronic systolic heart failure.  History of coronary artery disease with hypertension.  MRI HEAD WITHOUT CONTRAST MRA HEAD WITHOUT CONTRAST  Technique:  Multiplanar, multiecho pulse sequences of the brain and surrounding structures were obtained without intravenous contrast. Angiographic images of  the head were obtained using MRA technique without contrast.  Comparison:  09/24/2002 CT head.  No prior visible MR.  MRI HEAD  Findings:  Multiple subcentimeter foci of restricted diffusion indicating acute infarction are identified involving the right posterior frontal and parietal cortex and left parietal cortex/subcortical white matter (images 20 - 22, series 8, arrows). The brainstem and cerebellum are unaffected.  There are no foci of acute or chronic hemorrhage.  There is no mass lesion, hydrocephalus, or extra-axial fluid.  There is no significant atrophy.  Slight prominence perivascular spaces.  Chronic microvascular ischemic change overall with mild nature in the periventricular and subcortical white matter.  Normal pituitary.  Slight cerebellar tonsillar ectopia.  Unremarkable cervical region.  Negative sinus and mastoid regions.  IMPRESSION: At least three foci of acute subcentimeter cerebral infarction. The patient's right arm numbness may relate to a small area of infarction in the left parietal parasagittal cortex.  MRA HEAD  Findings: The internal carotid arteries are widely patent.  The basilar artery is widely patent but somewhat hypoplastic, perhaps related to fetal origin left PCA.  Left vertebral is the sole contributor to the basilar with the right vertebral ending in PICA. There is no proximal stenosis of the middle cerebral arteries or posterior cerebral arteries.  The left proximal A1 ACA is hypoplastic.  There is no visible cerebellar branch occlusion or intracranial aneurysm.  IMPRESSION: No proximal intracranial stenosis of the internal carotid arteries or middle cerebral artery.  Fetal origin left PCA.   Original Report Authenticated By: Davonna Belling, M.D.    Mr Brain Wo Contrast  09/25/2012  *RADIOLOGY REPORT*  Clinical Data:  Speech difficulty.  Right arm numbness.  History of mitral valve replacement,  history of chronic systolic heart failure.  History of coronary artery disease  with hypertension.  MRI HEAD WITHOUT CONTRAST MRA HEAD WITHOUT CONTRAST  Technique:  Multiplanar, multiecho pulse sequences of the brain and surrounding structures were obtained without intravenous contrast. Angiographic images of the head were obtained using MRA technique without contrast.  Comparison:  09/24/2002 CT head.  No prior visible MR.  MRI HEAD  Findings:  Multiple subcentimeter foci of restricted diffusion indicating acute infarction are identified involving the right posterior frontal and parietal cortex and left parietal cortex/subcortical white matter (images 20 - 22, series 8, arrows). The brainstem and cerebellum are unaffected.  There are no foci of acute or chronic hemorrhage.  There is no mass lesion, hydrocephalus, or extra-axial fluid.  There is no significant atrophy.  Slight prominence perivascular spaces.  Chronic microvascular ischemic change overall with mild nature in the periventricular and subcortical white matter.  Normal pituitary.  Slight cerebellar tonsillar ectopia.  Unremarkable cervical region.  Negative sinus and mastoid regions.  IMPRESSION: At least three foci of acute subcentimeter cerebral infarction. The patient's right arm numbness may relate to a small area of infarction in the left parietal parasagittal cortex.  MRA HEAD  Findings: The internal carotid arteries are widely patent.  The basilar artery is widely patent but somewhat hypoplastic, perhaps related to fetal origin left PCA.  Left vertebral is the sole contributor to the basilar with the right vertebral ending in PICA. There is no proximal stenosis of the middle cerebral arteries or posterior cerebral arteries.  The left proximal A1 ACA is hypoplastic.  There is no visible cerebellar branch occlusion or intracranial aneurysm.  IMPRESSION: No proximal intracranial stenosis of the internal carotid arteries or middle cerebral artery.  Fetal origin left PCA.   Original Report Authenticated By: Davonna Belling, M.D.      Scheduled Meds:   . ALPRAZolam      . atorvastatin  20 mg Oral q1800  . carvedilol  9.375 mg Oral BID WC  . clopidogrel  75 mg Oral Daily  . enalapril  5 mg Oral BID  . HYDROcodone-acetaminophen      . influenza  inactive virus vaccine  0.5 mL Intramuscular Tomorrow-1000  . insulin aspart  0-9 Units Subcutaneous TID WC  . pneumococcal 23 valent vaccine  0.5 mL Intramuscular Tomorrow-1000  . warfarin  7.5 mg Oral ONCE-1800  . warfarin  7.5 mg Oral ONCE-1800  . Warfarin - Pharmacist Dosing Inpatient   Does not apply q1800  . DISCONTD: atorvastatin  10 mg Oral q1800  . DISCONTD: heparin  2,000 Units Intravenous NOW  . DISCONTD: insulin aspart  0-9 Units Subcutaneous TID WC   Continuous Infusions:   . heparin 1,650 Units/hr (09/25/12 0259)  . DISCONTD: heparin 16 Units/kg/hr (09/24/12 2314)    Principal Problem:  *TIA (transient ischemic attack) Active Problems:  CAD  Chronic systolic heart failure  MITRAL VALVE REPLACEMENT, HX OF  Leukocytosis  Blood glucose elevated    Time spent: > 30 minutes    Penny Pia  [facility] Pager [phone]. If 8PM-8AM, please contact night-coverage at [email or web address], password [facility] 09/25/2012, 12:58 PM  LOS: 1 day

## 2012-09-25 NOTE — Progress Notes (Signed)
VASCULAR LAB PRELIMINARY  PRELIMINARY  PRELIMINARY  PRELIMINARY  Carotid duplex  completed.    Preliminary report:  Bilateral:  No evidence of hemodynamically significant internal carotid artery stenosis.   Vertebral artery flow is antegrade.      Elveta Rape, RVT 09/25/2012, 12:19 PM

## 2012-09-25 NOTE — Progress Notes (Signed)
  Echocardiogram 2D Echocardiogram with Definity has been performed.  [name] FRANCES 09/25/2012, 12:50 PM

## 2012-09-25 NOTE — Progress Notes (Signed)
ANTICOAGULATION CONSULT NOTE - Follow-Up Consult  Pharmacy Consult for Heparin Indication: St. Jude MVR, stroke like symptoms  Allergies  Allergen Reactions  . Cyclobenzaprine Hcl Nausea And Vomiting  . Latex     Patient Measurements: Weight: 202 lb 13.2 oz (92 kg) Height: 64 in Heparin dosing weight: 75 kg  Labs:  Basename 09/25/12 0040 09/24/12 1627  HGB -- --  HCT -- --  PLT -- --  APTT -- --  LABPROT -- 15.9*  INR -- 1.30  HEPARINUNFRC 0.12* --  CREATININE -- --  CKTOTAL -- --  CKMB -- --  TROPONINI -- --    The CrCl is unknown because both a height and weight (above a minimum accepted value) are required for this calculation.   Assessment: 51 year old on Coumadin for St. Jude mitral valve done in 2007. Admitted with CVA symptoms. MD wants heparin bridge due to risk of embolic stroke much greater than risk of hemorrhagic stroke. Heparin level 0.12 (subtherapeutic). RN states heparin gtt of for ~41min when pt in MRI ~2200 last night. No bleeding noted.  Goal of Therapy:  Heparin level = 0.3 to 0.5 Monitor platelets by anticoagulation protocol: Yes   Plan:  1) Increase heparin gtt to 1650 units/hr 2) F/u 6 hr heparin level  Christoper Fabian, PharmD, BCPS Clinical pharmacist, pager [phone] 09/25/2012,2:17 AM

## 2012-09-26 LAB — GLUCOSE, CAPILLARY

## 2012-09-26 LAB — CBC
HCT: 44.9 % (ref 36.0–46.0)
Platelets: 217 10*3/uL (ref 150–400)
RDW: 13.1 % (ref 11.5–15.5)
WBC: 10.4 10*3/uL (ref 4.0–10.5)

## 2012-09-26 LAB — HEPARIN LEVEL (UNFRACTIONATED): Heparin Unfractionated: 0.39 IU/mL (ref 0.30–0.70)

## 2012-09-26 LAB — PROTIME-INR: INR: 1.52 — ABNORMAL HIGH (ref 0.00–1.49)

## 2012-09-26 MED ORDER — WARFARIN SODIUM 7.5 MG PO TABS
7.5000 mg | ORAL_TABLET | Freq: Once | ORAL | Status: AC
  Administered 2012-09-26: 7.5 mg via ORAL
  Filled 2012-09-26: qty 1

## 2012-09-26 NOTE — Progress Notes (Signed)
ANTICOAGULATION CONSULT NOTE - Follow-Up Consult  Pharmacy Consult for Heparin Indication: St. Jude MVR, stroke like symptoms  Allergies  Allergen Reactions  . Cyclobenzaprine Hcl Nausea And Vomiting  . Latex     Patient Measurements: Height: 5\' 5"  (165.1 cm) Weight: 202 lb 13.2 oz (92 kg) IBW/kg (Calculated) : 57  Heparin dosing weight: 78 kg  Labs:  Basename 09/26/12 0650 09/25/12 2155 09/25/12 0832 09/25/12 0622 09/24/12 1627  HGB -- -- -- 16.3* --  HCT -- -- -- 47.9* --  PLT -- -- -- 250 --  APTT -- -- -- -- --  LABPROT 17.9* -- -- 17.6* 15.9*  INR 1.52* -- -- 1.49 1.30  HEPARINUNFRC 0.64 0.79* 0.70 -- --  CREATININE -- -- -- 0.84 --  CKTOTAL -- -- -- -- --  CKMB -- -- -- -- --  TROPONINI -- -- -- -- --    Estimated Creatinine Clearance: 89.8 ml/min (by C-G formula based on Cr of 0.84).     . heparin 1,350 Units/hr (09/25/12 2317)    Assessment: 51 year old on Coumadin for St. Jude mitral valve done in 2007. Admitted with CVA symptoms. MD wants heparin bridge due to risk of embolic stroke much greater than risk of hemorrhagic stroke. Heparin level (0.64) is still above-goal but trending down with decreased rate. No bleeding noted. INR remains subtherapeutic.  Goal of Therapy:  Heparin level = 0.3 to 0.5 Monitor platelets by anticoagulation protocol: Yes   Plan:  1. Decrease IV heparin to 1200 units/hr 2. Recheck 6 hr heparin level 3. Coumadin 7.5mg  po today 4. Daily INR  Christoper Fabian, PharmD, BCPS Clinical pharmacist, pager [phone] 09/26/2012, 7:39 AM

## 2012-09-26 NOTE — Progress Notes (Signed)
[facility] PROGRESS NOTE  [name]:[id number] DOB: 30-Jan-1961 DOA: 09/24/2012 PCP: Ignatius Specking., MD  Assessment/Plan: *TIA (transient ischemic attack)  -As discussed above, CT head had [place] negative. MRA MRI, echo, carotid Doppler as TIA admission protocol, with fasting lipid profile hemoglobin A1c.  -Neuro consulted as above, appreciate Dr. Alene Mires assistance-patient started on heparin to bridge till INR therapeutic on coumadin given her mechanical mitral valve.  - INR trending up and last value 1.52  Coumadin per pharmacy consult - Echocardiogram pending  Active Problems:  MITRAL VALVE REPLACEMENT, HX OF, with subtherapeutic INR  -Patient started on IV heparin to bridge till INR therapeutic as discussed with neuro.   CAD  -She is chest pain free, continue outpatient medications-including the Plavix as discussed with neuro.   Chronic systolic heart failure  -Compensated, continue ACEI, B blocker.   Leukocytosis  - Resolved and may have been 2ary to stress demargination - U/A on repeat without nitrites or leukocytosis. No complaints of dysuria.  Elevated blood glucose  - Patient prediabetic according to last hgbA1c of 6.4. - Place on diabetic diet and monitor blood sugars. - Continue SSI   Code Status: full Family Communication: Spoke with patient and family in room Disposition Plan: Pending further work up and recommendations.   Consultants:  Neurology: Dr. Amada Jupiter  Procedures:  Please refer to chart  Antibiotics:  None  HPI/Subjective: No new complaints this am.  No new focal neurological weaknesses reported.  Objective: Filed Vitals:   09/26/12 0200 09/26/12 0601 09/26/12 0920 09/26/12 1606  BP: 112/52 98/50 133/64 112/64  Pulse: 66 75 73 63  Temp: 97.7 F (36.5 C) 97.5 F (36.4 C) 97.9 F (36.6 C) 98.7 F (37.1 C)  TempSrc: Oral Oral    Resp: 18 18 20 20   Height:      Weight:      SpO2: 93% 96% 99% 100%     Intake/Output Summary (Last 24 hours) at 09/26/12 1655 Last data filed at 09/26/12 1607  Gross per 24 hour  Intake 1743.68 ml  Output      0 ml  Net 1743.68 ml   Filed Weights   09/24/12 1500 09/25/12 0500  Weight: 92 kg (202 lb 13.2 oz) 92 kg (202 lb 13.2 oz)    Exam:   General:  Pt in NAD, A and O x 3  Cardiovascular: RRR, no rubs  Respiratory: CTA BL, no wheezes  Abdomen: soft, NT  Neuro: GL grip strength equal, answers questions appropriately  Data Reviewed: Basic Metabolic Panel:  Lab 09/25/12 8119  NA 138  K 3.6  CL 100  CO2 23  GLUCOSE 114*  BUN 12  CREATININE 0.84  CALCIUM 8.8  MG --  PHOS --   Liver Function Tests: No results found for this basename: AST:5,ALT:5,ALKPHOS:5,BILITOT:5,PROT:5,ALBUMIN:5 in the last 168 hours No results found for this basename: LIPASE:5,AMYLASE:5 in the last 168 hours No results found for this basename: AMMONIA:5 in the last 168 hours CBC:  Lab 09/26/12 0650 09/25/12 0622  WBC 10.4 9.2  NEUTROABS -- --  HGB 15.3* 16.3*  HCT 44.9 47.9*  MCV 98.0 97.0  PLT 217 250   Cardiac Enzymes: No results found for this basename: CKTOTAL:5,CKMB:5,CKMBINDEX:5,TROPONINI:5 in the last 168 hours BNP (last 3 results) No results found for this basename: PROBNP:3 in the last 8760 hours CBG:  Lab 09/26/12 1148 09/25/12 2240 09/25/12 1646 09/25/12 1120 09/25/12 0725  GLUCAP 101* 135* 110* 142* 108*    No results found for  this or any previous visit (from the past 240 hour(s)).   Studies: Mr [name] ZO Contrast  09/25/2012  *RADIOLOGY REPORT*  Clinical Data:  Speech difficulty.  Right arm numbness.  History of mitral valve replacement,  history of chronic systolic heart failure.  History of coronary artery disease with hypertension.  MRI HEAD WITHOUT CONTRAST MRA HEAD WITHOUT CONTRAST  Technique:  Multiplanar, multiecho pulse sequences of the brain and surrounding structures were obtained without intravenous contrast.  Angiographic images of the head were obtained using MRA technique without contrast.  Comparison:  09/24/2002 CT head.  No prior visible MR.  MRI HEAD  Findings:  Multiple subcentimeter foci of restricted diffusion indicating acute infarction are identified involving the right posterior frontal and parietal cortex and left parietal cortex/subcortical white matter (images 20 - 22, series 8, arrows). The brainstem and cerebellum are unaffected.  There are no foci of acute or chronic hemorrhage.  There is no mass lesion, hydrocephalus, or extra-axial fluid.  There is no significant atrophy.  Slight prominence perivascular spaces.  Chronic microvascular ischemic change overall with mild nature in the periventricular and subcortical white matter.  Normal pituitary.  Slight cerebellar tonsillar ectopia.  Unremarkable cervical region.  Negative sinus and mastoid regions.  IMPRESSION: At least three foci of acute subcentimeter cerebral infarction. The patient's right arm numbness may relate to a small area of infarction in the left parietal parasagittal cortex.  MRA HEAD  Findings: The internal carotid arteries are widely patent.  The basilar artery is widely patent but somewhat hypoplastic, perhaps related to fetal origin left PCA.  Left vertebral is the sole contributor to the basilar with the right vertebral ending in PICA. There is no proximal stenosis of the middle cerebral arteries or posterior cerebral arteries.  The left proximal A1 ACA is hypoplastic.  There is no visible cerebellar branch occlusion or intracranial aneurysm.  IMPRESSION: No proximal intracranial stenosis of the internal carotid arteries or middle cerebral artery.  Fetal origin left PCA.   Original Report Authenticated By: Davonna Belling, M.D.    Mr Brain Wo Contrast  09/25/2012  *RADIOLOGY REPORT*  Clinical Data:  Speech difficulty.  Right arm numbness.  History of mitral valve replacement,  history of chronic systolic heart failure.  History of  coronary artery disease with hypertension.  MRI HEAD WITHOUT CONTRAST MRA HEAD WITHOUT CONTRAST  Technique:  Multiplanar, multiecho pulse sequences of the brain and surrounding structures were obtained without intravenous contrast. Angiographic images of the head were obtained using MRA technique without contrast.  Comparison:  09/24/2002 CT head.  No prior visible MR.  MRI HEAD  Findings:  Multiple subcentimeter foci of restricted diffusion indicating acute infarction are identified involving the right posterior frontal and parietal cortex and left parietal cortex/subcortical white matter (images 20 - 22, series 8, arrows). The brainstem and cerebellum are unaffected.  There are no foci of acute or chronic hemorrhage.  There is no mass lesion, hydrocephalus, or extra-axial fluid.  There is no significant atrophy.  Slight prominence perivascular spaces.  Chronic microvascular ischemic change overall with mild nature in the periventricular and subcortical white matter.  Normal pituitary.  Slight cerebellar tonsillar ectopia.  Unremarkable cervical region.  Negative sinus and mastoid regions.  IMPRESSION: At least three foci of acute subcentimeter cerebral infarction. The patient's right arm numbness may relate to a small area of infarction in the left parietal parasagittal cortex.  MRA HEAD  Findings: The internal carotid arteries are widely patent.  The basilar  artery is widely patent but somewhat hypoplastic, perhaps related to fetal origin left PCA.  Left vertebral is the sole contributor to the basilar with the right vertebral ending in PICA. There is no proximal stenosis of the middle cerebral arteries or posterior cerebral arteries.  The left proximal A1 ACA is hypoplastic.  There is no visible cerebellar branch occlusion or intracranial aneurysm.  IMPRESSION: No proximal intracranial stenosis of the internal carotid arteries or middle cerebral artery.  Fetal origin left PCA.   Original Report Authenticated By:  Davonna Belling, M.D.     Scheduled Meds:    . atorvastatin  20 mg Oral q1800  . carvedilol  9.375 mg Oral BID WC  . clopidogrel  75 mg Oral Daily  . enalapril  5 mg Oral BID  . insulin aspart  0-9 Units Subcutaneous TID WC  . warfarin  7.5 mg Oral ONCE-1800  . [COMPLETED] warfarin  7.5 mg Oral ONCE-1800  . warfarin  7.5 mg Oral ONCE-1800  . Warfarin - Pharmacist Dosing Inpatient   Does not apply q1800   Continuous Infusions:    . heparin 1,200 Units/hr (09/26/12 0941)    Principal Problem:  *TIA (transient ischemic attack) Active Problems:  CAD  Chronic systolic heart failure  MITRAL VALVE REPLACEMENT, HX OF  Leukocytosis  Blood glucose elevated    Time spent: > 30 minutes    Penny Pia  [facility] Pager [phone]. If 8PM-8AM, please contact night-coverage at [email or web address], password [email or web address] 09/26/2012, 4:55 PM  LOS: 2 days

## 2012-09-26 NOTE — Progress Notes (Signed)
Phone call received from pharmacy to decrease heparin level. Will do so and cont to monitor patient.

## 2012-09-26 NOTE — Progress Notes (Signed)
ANTICOAGULATION CONSULT NOTE - Follow-Up Consult  Pharmacy Consult for Heparin Indication: St. Jude MVR, stroke like symptoms  Allergies  Allergen Reactions  . Cyclobenzaprine Hcl Nausea And Vomiting  . Latex     Patient Measurements: Height: 5\' 5"  (165.1 cm) Weight: 202 lb 13.2 oz (92 kg) IBW/kg (Calculated) : 57  Heparin dosing weight: 78 kg  Labs:  Basename 09/26/12 1400 09/26/12 0650 09/25/12 2155 09/25/12 0622 09/24/12 1627  HGB -- 15.3* -- 16.3* --  HCT -- 44.9 -- 47.9* --  PLT -- 217 -- 250 --  APTT -- -- -- -- --  LABPROT -- 17.9* -- 17.6* 15.9*  INR -- 1.52* -- 1.49 1.30  HEPARINUNFRC 0.39 0.64 0.79* -- --  CREATININE -- -- -- 0.84 --  CKTOTAL -- -- -- -- --  CKMB -- -- -- -- --  TROPONINI -- -- -- -- --    Estimated Creatinine Clearance: 89.8 ml/min (by C-G formula based on Cr of 0.84).     . heparin 1,200 Units/hr (09/26/12 0941)    Assessment: 51 year old on Coumadin for St. Jude mitral valve done in 2007. Admitted with CVA symptoms. MD wants heparin bridge due to risk of embolic stroke much greater than risk of hemorrhagic stroke.   Repeat heparin level (0.39) is therapeutic for goal. No bleeding noted.  Goal of Therapy:  Heparin level = 0.3 to 0.5 Monitor platelets by anticoagulation protocol: Yes   Plan:  1. Continue on IV heparin at 1200 units/hr 2. Follow-up level in AM.   Link Snuffer, PharmD, BCPS Clinical Pharmacist [phone] 09/26/2012, 3:47 PM

## 2012-09-26 NOTE — Progress Notes (Signed)
Stroke Team Progress Note  HISTORY  LUCY BOARDMAN is an 51 y.o. female with rheumatic heart disease S/P St. Jude mitral valve in 2007. Who woke up at 6 am on 09/24/12 at baseline. Patient went to [facility] and around 10 am she noted she had a hard time expressing herself and noted her right arm would not move. This occurred for for about one hour and then cleared. This occurred 2 other times over a period of 4 hours. At time of initial ED visit at [facility] her INR was 1.2 and initial CT head was negative for acute stroke or bleed. Patient was transferred to [facility] for further neurological evaluation. At present time she is back to her baseline with no neurological deficits  Patient was not a TPA candidate secondary to out of window. She was admitted to the neuro floor for further evaluation and treatment.  SUBJECTIVE  Sitting in chair. Feels like her speech is better. She wants to go home. She tells that she is upset that she has been fighting with boyfriend, left, now with cousin, left and living with sister and now uncertain of her living situation.    OBJECTIVE Most recent Vital Signs: Filed Vitals:   09/25/12 1803 09/25/12 2200 09/26/12 0200 09/26/12 0601  BP: 139/82 118/61 112/52 98/50  Pulse: 74 73 66 75  Temp: 97.8 F (36.6 C) 98.1 F (36.7 C) 97.7 F (36.5 C) 97.5 F (36.4 C)  TempSrc: Oral Oral Oral Oral  Resp: 18 18 18 18   Height:      Weight:      SpO2: 98% 97% 93% 96%   CBG (last 3)   Basename 09/25/12 2240 09/25/12 1646 09/25/12 1120  GLUCAP 135* 110* 142*    IV Fluid Intake:      . heparin 1,350 Units/hr (09/25/12 2317)    MEDICATIONS     . atorvastatin  20 mg Oral q1800  . carvedilol  9.375 mg Oral BID WC  . clopidogrel  75 mg Oral Daily  . enalapril  5 mg Oral BID  . [COMPLETED] influenza  inactive virus vaccine  0.5 mL Intramuscular Tomorrow-1000  . insulin aspart  0-9 Units Subcutaneous TID WC  . [COMPLETED] pneumococcal 23 valent  vaccine  0.5 mL Intramuscular Tomorrow-1000  . warfarin  7.5 mg Oral ONCE-1800  . [COMPLETED] warfarin  7.5 mg Oral ONCE-1800  . warfarin  7.5 mg Oral ONCE-1800  . Warfarin - Pharmacist Dosing Inpatient   Does not apply q1800  . [DISCONTINUED] atorvastatin  10 mg Oral q1800   PRN:  acetaminophen, acetaminophen, ALPRAZolam, furosemide, HYDROcodone-acetaminophen, nitroGLYCERIN, ondansetron (ZOFRAN) IV, ondansetron, [DISCONTINUED] zolpidem  Diet:  Carb Control thin liquids Activity:  Bathroom privileges  with assistance DVT Prophylaxis:  heparin  CLINICALLY SIGNIFICANT STUDIES Basic Metabolic Panel:   Lab 09/25/12 0622  NA 138  K 3.6  CL 100  CO2 23  GLUCOSE 114*  BUN 12  CREATININE 0.84  CALCIUM 8.8  MG --  PHOS --    CBC:   Lab 09/26/12 0650 09/25/12 0622  WBC 10.4 9.2  NEUTROABS -- --  HGB 15.3* 16.3*  HCT 44.9 47.9*  MCV 98.0 97.0  PLT 217 250   Coagulation:   Lab 09/26/12 0650 09/25/12 0622 09/24/12 1627  LABPROT 17.9* 17.6* 15.9*  INR 1.52* 1.49 1.30    Urinalysis:   Lab 09/25/12 0538  COLORURINE YELLOW  LABSPEC 1.024  PHURINE 5.0  GLUCOSEU NEGATIVE  HGBUR LARGE*  BILIRUBINUR NEGATIVE  KETONESUR  15*  PROTEINUR NEGATIVE  UROBILINOGEN 0.2  NITRITE NEGATIVE  LEUKOCYTESUR NEGATIVE   Lipid Panel    Component Value Date/Time   CHOL 238* 09/25/2012 0622   TRIG 200* 09/25/2012 0622   HDL 28* 09/25/2012 0622   CHOLHDL 8.5 09/25/2012 0622   VLDL 40 09/25/2012 0622   LDLCALC 170* 09/25/2012 0622   HgbA1C  Lab Results  Component Value Date   HGBA1C 6.4* 09/24/2012    Urine Drug Screen:     Component Value Date/Time   LABOPIA POSITIVE* 09/25/2012 0538   COCAINSCRNUR NONE DETECTED 09/25/2012 0538   LABBENZ POSITIVE* 09/25/2012 0538   AMPHETMU NONE DETECTED 09/25/2012 0538   THCU POSITIVE* 09/25/2012 0538   LABBARB NONE DETECTED 09/25/2012 0538    CT of the brain  (negative per outside report)  MRI of the brain--- tiny foci of acute infarction. The  patient's right arm numbness may relate to a small area of  infarction in the left parietal parasagittal cortex  MRA of the brain---No proximal intracranial stenosis of the internal carotid arteries or middle cerebral artery. Fetal origin left PCA.  2D Echocardiogram---pending   Carotid Doppler---Bilateral: No evidence of hemodynamically significant internal carotid artery stenosis. Vertebral artery flow is antegrade.   CXR    Therapy Recommendations PT - (no f/u); OT - ; ST -   Physical Exam   Awake, Alert interactive and appropriate.  CN: EOMI, PERRL, VFF Motor: 5/5 throughout.  Sensory: Intact to light touch Cerebeller: intact FNF    ASSESSMENT Ms. RONELLA PLUNK is a 51 y.o. female presenting with right arm numbness/plegia and expressive aphasia on three different episodes, each about an hour long. MR imaging pending.  Work up underway. On coumadin and clopidogrel 75 mg orally every day prior to admission. Now on coumadin and clopidogrel 75 mg orally every day and heparin for secondary stroke prevention. Her INR is subtherapeutic. Patient with resultant right arm numbness/plegia and expressive aphasia.     right arm numbness/plegia and expressive aphasia.  Hyperlipidemia  Polysubstance abuse  Rheumatic heart disease, s/p St Jude 2007-coumadin  Non-obstructive CAD 2007  Hypertension  Sub therapeutic INR with symptoms  Social situation at home that patient wants help with as she does not have a regular place to stay  Hospital day # 2  TREATMENT/PLAN   Continue clopidogrel 75 mg orally every day, warfarin and heparin for secondary stroke prevention. She has three tiny areas of infarction, but with her mechanical valve,  favor continuing anticoagulation as the risk of ischemic stroke without heparin outweighs the risk of hemorrhage with heparin. INR remains sub therapeutic  Social worker consult  Substance abuse counseling  Risk factor modification  Therapy  evaluations and treatment    Job Founds, MBA, MHA [facility] Pager [phone]    I have seen and evaluated the patient. I have reviewed the above note and made appropriate changes. Continue to await therapeutic INR.  Ritta Slot, MD [facility] [phone]  If 7pm- 7am, please page neurology on call at [phone].

## 2012-09-27 ENCOUNTER — Other Ambulatory Visit: Payer: Self-pay | Admitting: Cardiology

## 2012-09-27 DIAGNOSIS — I639 Cerebral infarction, unspecified: Secondary | ICD-10-CM

## 2012-09-27 DIAGNOSIS — I6340 Cerebral infarction due to embolism of unspecified cerebral artery: Principal | ICD-10-CM

## 2012-09-27 DIAGNOSIS — Z9889 Other specified postprocedural states: Secondary | ICD-10-CM

## 2012-09-27 DIAGNOSIS — I6350 Cerebral infarction due to unspecified occlusion or stenosis of unspecified cerebral artery: Secondary | ICD-10-CM

## 2012-09-27 DIAGNOSIS — M549 Dorsalgia, unspecified: Secondary | ICD-10-CM

## 2012-09-27 LAB — CBC
MCH: 33.1 pg (ref 26.0–34.0)
MCV: 99.1 fL (ref 78.0–100.0)
Platelets: 193 10*3/uL (ref 150–400)
RDW: 13 % (ref 11.5–15.5)

## 2012-09-27 LAB — PROTIME-INR: Prothrombin Time: 19.1 seconds — ABNORMAL HIGH (ref 11.6–15.2)

## 2012-09-27 LAB — GLUCOSE, CAPILLARY
Glucose-Capillary: 110 mg/dL — ABNORMAL HIGH (ref 70–99)
Glucose-Capillary: 112 mg/dL — ABNORMAL HIGH (ref 70–99)
Glucose-Capillary: 97 mg/dL (ref 70–99)
Glucose-Capillary: 99 mg/dL (ref 70–99)

## 2012-09-27 LAB — HEPARIN LEVEL (UNFRACTIONATED): Heparin Unfractionated: 0.39 IU/mL (ref 0.30–0.70)

## 2012-09-27 MED ORDER — WARFARIN SODIUM 4 MG PO TABS
8.0000 mg | ORAL_TABLET | Freq: Once | ORAL | Status: AC
  Administered 2012-09-27: 8 mg via ORAL
  Filled 2012-09-27: qty 2

## 2012-09-27 NOTE — Progress Notes (Signed)
   CARE MANAGEMENT NOTE 09/27/2012  Patient:  [name], [name]   Account Number:  [id number]  Date Initiated:  09/27/2012  Documentation initiated by:  [facility]  Subjective/Objective Assessment:   TIA     Action/Plan:   living with sister currently   Anticipated DC Date:  09/28/2012   Anticipated DC Plan:  HOME W HOME HEALTH SERVICES      DC Planning Services  CM consult      Choice offered to / List presented to:             Status of service:  In process, will continue to follow Medicare Important Message given?   (If response is "NO", the following Medicare IM given date fields will be blank) Date Medicare IM given:   Date Additional Medicare IM given:    Discharge Disposition:    Per UR Regulation:    If discussed at Long Length of Stay Meetings, dates discussed:    Comments:  09/27/2012 1730 NCM spoke to pt and states she is living with her sister temp. She is trying to locate housing after moving from her boyfriend's home. Her daughter is at home with her to assist with her care and financial concerns. Her dtr did go to [facility] to pick up her meds. States she is on a fixed income each month. Receiving disability each month. States added stress of having to move, she missed taking her medications. NCM explained she can follow up with DSS, [employer] and [employer] for possible resources to help her locate housing and paying rent for new apt. NCM will continue to follow up with pt on d/c needs. Isidoro Donning RN CCM Case Mgmt phone [phone]

## 2012-09-27 NOTE — Progress Notes (Signed)
Stroke Team Progress Note  HISTORY [name] is an 51 y.o. female with rheumatic heart disease S/P St. Jude mitral valve in 2007. Who woke up at 6 am on 09/24/12 at baseline. Patient went to [facility] and around 10 am she noted she had a hard time expressing herself and noted her right arm would not move. This occurred for for about one hour and then cleared. This occurred 2 other times over a period of 4 hours. At time of initial ED visit at [facility] her INR was 1.2 and initial CT head was negative for acute stroke or bleed. Patient was transferred to [facility] for further neurological evaluation. At present time she is back to her baseline with no neurological deficits  Patient was not a TPA candidate secondary to out of window. She was admitted to the neuro floor for further evaluation and treatment.  SUBJECTIVE No complaints.  OBJECTIVE Most recent Vital Signs: Filed Vitals:   09/26/12 1945 09/26/12 2119 09/27/12 0317 09/27/12 0615  BP: 101/52 103/51 102/51 95/62  Pulse: 72 67 60 58  Temp: 97.8 F (36.6 C) 98.3 F (36.8 C) 97.6 F (36.4 C) 97.8 F (36.6 C)  TempSrc:  Oral Oral Oral  Resp: 20 18 18 18   Height:      Weight:      SpO2: 98% 97% 99% 96%   CBG (last 3)   Basename 09/27/12 0651 09/26/12 1724 09/26/12 1148  GLUCAP 105* 145* 101*   IV Fluid Intake:     . heparin 1,200 Units/hr (09/27/12 4782)   MEDICATIONS    . atorvastatin  20 mg Oral q1800  . carvedilol  9.375 mg Oral BID WC  . clopidogrel  75 mg Oral Daily  . enalapril  5 mg Oral BID  . insulin aspart  0-9 Units Subcutaneous TID WC  . [COMPLETED] warfarin  7.5 mg Oral ONCE-1800  . Warfarin - Pharmacist Dosing Inpatient   Does not apply q1800   PRN:  acetaminophen, acetaminophen, ALPRAZolam, furosemide, HYDROcodone-acetaminophen, nitroGLYCERIN, ondansetron (ZOFRAN) IV, ondansetron  Diet:  Carb Control thin liquids Activity:  Bathroom privileges  with assistance DVT Prophylaxis:  IV  heparin  CLINICALLY SIGNIFICANT STUDIES Basic Metabolic Panel:  Lab 09/25/12 9562  NA 138  K 3.6  CL 100  CO2 23  GLUCOSE 114*  BUN 12  CREATININE 0.84  CALCIUM 8.8  MG --  PHOS --   CBC:  Lab 09/27/12 0605 09/26/12 0650  WBC 7.5 10.4  NEUTROABS -- --  HGB 14.1 15.3*  HCT 42.2 44.9  MCV 99.1 98.0  PLT 193 217   Coagulation:  Lab 09/27/12 0605 09/26/12 0650 09/25/12 0622 09/24/12 1627  LABPROT 19.1* 17.9* 17.6* 15.9*  INR 1.66* 1.52* 1.49 1.30   Urinalysis:  Lab 09/25/12 0538  COLORURINE YELLOW  LABSPEC 1.024  PHURINE 5.0  GLUCOSEU NEGATIVE  HGBUR LARGE*  BILIRUBINUR NEGATIVE  KETONESUR 15*  PROTEINUR NEGATIVE  UROBILINOGEN 0.2  NITRITE NEGATIVE  LEUKOCYTESUR NEGATIVE   Lipid Panel    Component Value Date/Time   CHOL 238* 09/25/2012 0622   TRIG 200* 09/25/2012 0622   HDL 28* 09/25/2012 0622   CHOLHDL 8.5 09/25/2012 0622   VLDL 40 09/25/2012 0622   LDLCALC 170* 09/25/2012 0622   HgbA1C  Lab Results  Component Value Date   HGBA1C 6.4* 09/24/2012    Urine Drug Screen:     Component Value Date/Time   LABOPIA POSITIVE* 09/25/2012 0538   COCAINSCRNUR NONE DETECTED 09/25/2012 1308  LABBENZ POSITIVE* 09/25/2012 0538   AMPHETMU NONE DETECTED 09/25/2012 0538   THCU POSITIVE* 09/25/2012 0538   LABBARB NONE DETECTED 09/25/2012 0538    CT of the brain  (negative per outside report)  MRI of the brain--- tiny foci of acute infarction. The patient's right arm numbness may relate to a small area of infarction in the left parietal parasagittal cortex  MRA of the brain---No proximal intracranial stenosis of the internal carotid arteries or middle cerebral artery. Fetal origin left PCA.  2D Echocardiogram---EF 35-40% with no source of embolus. no clearly defined valvular vegetations seen, imaging is difficult particularly with acoustic shadowing in region of the mechanical mitral prosthesis.  Carotid Doppler---Bilateral: No evidence of hemodynamically significant internal  carotid artery stenosis. Vertebral artery flow is antegrade.   CXR    Therapy Recommendations PT - (no f/u); OT - none   Physical Exam   Awake alert. Afebrile. Head is nontraumatic. Neck is supple without bruit. Hearing is normal. Cardiac exam no murmur or gallop. Lungs are clear to auscultation. Distal pulses are well felt.  Neurological exam :  Awake  Alert oriented x 3. Normal speech and language.eye movements full without nystagmus.Fundi not visualized. Vision acuity and fields appear normal.. Face symmetric. Tongue midline. Normal strength, tone, reflexes and coordination. Normal sensation. Gait deferred.  ASSESSMENT [name] is a 51 y.o. female presenting with right arm numbness/plegia and expressive aphasia on three different episodes, each about an hour long. MR imaging confirms bilateral small subcentimeter infarcts. Largest stroke right parietal, asymptomatic. Work up completed. On coumadin and clopidogrel 75 mg orally every day prior to admission. Now on coumadin and clopidogrel 75 mg orally every day and IV heparin for secondary stroke prevention. Her INR is subtherapeutic. Patient with resultant right arm numbness/plegia and expressive aphasia.     Hyperlipidemia, on lipitor  Polysubstance abuse, positive for THC which has been associated with acute stroke  Rheumatic heart disease, s/p St Jude 2007-coumadin  Non-obstructive CAD 2007  Hypertension  Sub therapeutic INR with symptoms  Social situation at home that patient wants help with as she does not have a regular place to stay  Hospital day # 3  TREATMENT/PLAN  Continue clopidogrel 75 mg orally every day, warfarin and heparin for secondary stroke prevention. Discharge once INR  >2.0 , target 3-3.5  Substance abuse counseling - stop THC  Ongoing Risk factor modification  No Therapy needs Stroke Service will sign off. Follow up with Dr. Pearlean Brownie, Stroke Clinic, in 2 months.  Annie Main, MSN, RN,  ANVP-BC, ANP-BC, [facility] Pager: [phone] 09/27/2012 8:56 AM  Scribe for Dr. Delia Heady, Stroke Center Medical Director, who has personally reviewed chart, pertinent data, examined the patient and developed the plan of care. Pager:  [phone]

## 2012-09-27 NOTE — Progress Notes (Signed)
ANTICOAGULATION CONSULT NOTE - Follow Up Consult  Pharmacy Consult for Heparin and Coumadin Indication: MVR, acute CVA  Allergies  Allergen Reactions  . Cyclobenzaprine Hcl Nausea And Vomiting  . Latex     Patient Measurements: Height: 5\' 5"  (165.1 cm) Weight: 202 lb 13.2 oz (92 kg) IBW/kg (Calculated) : 57  Heparin Dosing Weight: 78kg  Vital Signs: Temp: 97.8 F (36.6 C) (11/04 0615) Temp src: Oral (11/04 0615) BP: 117/58 mmHg (11/04 1050) Pulse Rate: 58  (11/04 0615)  Labs:  Basename 09/27/12 0605 09/26/12 1400 09/26/12 0650 09/25/12 0622  HGB 14.1 -- 15.3* --  HCT 42.2 -- 44.9 47.9*  PLT 193 -- 217 250  APTT -- -- -- --  LABPROT 19.1* -- 17.9* 17.6*  INR 1.66* -- 1.52* 1.49  HEPARINUNFRC 0.39 0.39 0.64 --  CREATININE -- -- -- 0.84  CKTOTAL -- -- -- --  CKMB -- -- -- --  TROPONINI -- -- -- --    Estimated Creatinine Clearance: 89.8 ml/min (by C-G formula based on Cr of 0.84).   Medications:  Heparin 1200 units/hr  Assessment: 51yof on Heparin bridging to Coumadin for St. Jude MVR. Patient was admitted for acute CVA and Heparin bridge was started due to risk of embolic stroke greater than risk of hemorrhagic stroke per MD. Heparin level (0.39) is now therapeutic x 2 - continue current rate. INR (1.66) is subtherapeutic but is trending up slowly. Coumadin PTA regimen 5mg  daily. - H/H and Plts wnl - No significant bleeding reported  Goal of Therapy:  Heparin level 0.3-0.5 units/ml INR 2.5-3.5 Monitor platelets by anticoagulation protocol: Yes   Plan:  1. Continue heparin 1200 units/hr (12 ml/hr) 2. Coumadin 8mg  po x 1 today 3. Follow-up AM INR, heparin level and CBC  Cleon Dew [phone] 09/27/2012,11:54 AM

## 2012-09-27 NOTE — Progress Notes (Signed)
[facility] PROGRESS NOTE  [name] ION:[id number] DOB: 1961-03-27 DOA: 09/24/2012 PCP: Ignatius Specking., MD  Assessment/Plan: *TIA (transient ischemic attack) and CVA -As discussed above, CT head at [facility] negative.  - MRI shows at least three foci of acute subcentimeter cerebral infarction -Neuro consulted as above, appreciate Dr. Alene Mires assistance-patient started on heparin to bridge till INR therapeutic on coumadin given her mechanical mitral valve.  - INR trending up and last value 1.66  Coumadin per pharmacy consult - Echocardiogram pending - Lovenox injections not an option given that it has been associated with increased risk of hemorrhage in new thrombotic strokes per discussion with neurology.  Therefore heparin should continue to be used until INR 2.0 or above. Target INR 3.0-3.5 given history of mechanical heart valve.  Active Problems:  MITRAL VALVE REPLACEMENT, HX OF, with subtherapeutic INR  -Patient started on IV heparin to bridge till INR therapeutic as discussed with neuro.  - Please see above.  CAD  -She is chest pain free, continue outpatient medications-including the Plavix as discussed with neuro.   Chronic systolic heart failure  -Compensated, continue ACEI, B blocker.   Leukocytosis  - Resolved and may have been 2ary to stress demargination - U/A on repeat without nitrites or leukocytosis. No complaints of dysuria.  Elevated blood glucose  - Patient prediabetic according to last hgbA1c of 6.4. - Place on diabetic diet and monitor blood sugars. - Continue SSI   Code Status: full Family Communication: Spoke with patient and family in room Disposition Plan: Pending further work up and recommendations.   Consultants:  Neurology: Dr. Pearlean Brownie  Procedures:  Please refer to chart  Antibiotics:  None  HPI/Subjective: No new complaints this am.  No new focal neurological weaknesses reported.  Objective: Filed Vitals:   09/26/12  2119 09/27/12 0317 09/27/12 0615 09/27/12 1050  BP: 103/51 102/51 95/62 117/58  Pulse: 67 60 58   Temp: 98.3 F (36.8 C) 97.6 F (36.4 C) 97.8 F (36.6 C)   TempSrc: Oral Oral Oral   Resp: 18 18 18    Height:      Weight:      SpO2: 97% 99% 96%    No intake or output data in the 24 hours ending 09/27/12 1636 Filed Weights   09/24/12 1500 09/25/12 0500  Weight: 92 kg (202 lb 13.2 oz) 92 kg (202 lb 13.2 oz)    Exam:   General:  Pt in NAD, A and O x 3  Cardiovascular: RRR, no rubs  Respiratory: CTA BL, no wheezes  Abdomen: soft, NT  Neuro: GL grip strength equal, answers questions appropriately  Data Reviewed: Basic Metabolic Panel:  Lab 09/25/12 2440  NA 138  K 3.6  CL 100  CO2 23  GLUCOSE 114*  BUN 12  CREATININE 0.84  CALCIUM 8.8  MG --  PHOS --   Liver Function Tests: No results found for this basename: AST:5,ALT:5,ALKPHOS:5,BILITOT:5,PROT:5,ALBUMIN:5 in the last 168 hours No results found for this basename: LIPASE:5,AMYLASE:5 in the last 168 hours No results found for this basename: AMMONIA:5 in the last 168 hours CBC:  Lab 09/27/12 0605 09/26/12 0650 09/25/12 0622  WBC 7.5 10.4 9.2  NEUTROABS -- -- --  HGB 14.1 15.3* 16.3*  HCT 42.2 44.9 47.9*  MCV 99.1 98.0 97.0  PLT 193 217 250   Cardiac Enzymes: No results found for this basename: CKTOTAL:5,CKMB:5,CKMBINDEX:5,TROPONINI:5 in the last 168 hours BNP (last 3 results) No results found for this basename: PROBNP:3 in the last 8760 hours  CBG:  Lab 09/27/12 1614 09/27/12 1159 09/27/12 0651 09/26/12 2217 09/26/12 1724  GLUCAP 99 97 105* 112* 145*    No results found for this or any previous visit (from the past 240 hour(s)).   Studies: No results found.  Scheduled Meds:    . atorvastatin  20 mg Oral q1800  . carvedilol  9.375 mg Oral BID WC  . clopidogrel  75 mg Oral Daily  . enalapril  5 mg Oral BID  . insulin aspart  0-9 Units Subcutaneous TID WC  . [COMPLETED] warfarin  7.5 mg Oral  ONCE-1800  . warfarin  8 mg Oral ONCE-1800  . Warfarin - Pharmacist Dosing Inpatient   Does not apply q1800   Continuous Infusions:    . heparin 1,200 Units/hr (09/27/12 1610)    Principal Problem:  *Embolic cerebral infarction, bilateral Active Problems:  CAD  Chronic systolic heart failure  MITRAL VALVE REPLACEMENT, HX OF  Leukocytosis  Blood glucose elevated    Time spent: > 30 minutes    Penny Pia  [facility] Pager [phone]. If 8PM-8AM, please contact night-coverage at [email or web address], password [facility] 09/27/2012, 4:36 PM  LOS: 3 days

## 2012-09-28 LAB — CBC
MCH: 33.3 pg (ref 26.0–34.0)
MCHC: 34.1 g/dL (ref 30.0–36.0)
MCV: 97.8 fL (ref 78.0–100.0)
Platelets: 227 10*3/uL (ref 150–400)
RDW: 13 % (ref 11.5–15.5)

## 2012-09-28 LAB — PROTIME-INR: Prothrombin Time: 20.6 seconds — ABNORMAL HIGH (ref 11.6–15.2)

## 2012-09-28 LAB — GLUCOSE, CAPILLARY
Glucose-Capillary: 100 mg/dL — ABNORMAL HIGH (ref 70–99)
Glucose-Capillary: 162 mg/dL — ABNORMAL HIGH (ref 70–99)

## 2012-09-28 MED ORDER — WARFARIN SODIUM 4 MG PO TABS
8.0000 mg | ORAL_TABLET | Freq: Once | ORAL | Status: AC
  Administered 2012-09-28: 8 mg via ORAL
  Filled 2012-09-28: qty 2

## 2012-09-28 MED ORDER — HYDROCODONE-ACETAMINOPHEN 5-325 MG PO TABS
1.0000 | ORAL_TABLET | Freq: Four times a day (QID) | ORAL | Status: DC | PRN
  Administered 2012-09-28 – 2012-09-29 (×3): 1 via ORAL
  Filled 2012-09-28 (×3): qty 1

## 2012-09-28 MED ORDER — HYDROCODONE-ACETAMINOPHEN 5-325 MG PO TABS
1.0000 | ORAL_TABLET | Freq: Once | ORAL | Status: AC
  Administered 2012-09-28: 1 via ORAL

## 2012-09-28 NOTE — Progress Notes (Signed)
ANTICOAGULATION CONSULT NOTE - Follow Up Consult  Pharmacy Consult for Heparin and Coumadin Indication: MVR, acute CVA  Allergies  Allergen Reactions  . Cyclobenzaprine Hcl Nausea And Vomiting  . Latex     Patient Measurements: Height: 5\' 5"  (165.1 cm) Weight: 202 lb 13.2 oz (92 kg) IBW/kg (Calculated) : 57  Heparin Dosing Weight: 78kg  Vital Signs: Temp: 97.5 F (36.4 C) (11/05 0700) Temp src: Oral (11/05 0700) BP: 134/70 mmHg (11/05 0700) Pulse Rate: 58  (11/05 0700)  Labs:  Basename 09/28/12 0622 09/27/12 0605 09/26/12 1400 09/26/12 0650  HGB 13.7 14.1 -- --  HCT 40.2 42.2 -- 44.9  PLT 227 193 -- 217  APTT -- -- -- --  LABPROT 20.6* 19.1* -- 17.9*  INR 1.84* 1.66* -- 1.52*  HEPARINUNFRC 0.46 0.39 0.39 --  CREATININE -- -- -- --  CKTOTAL -- -- -- --  CKMB -- -- -- --  TROPONINI -- -- -- --    Estimated Creatinine Clearance: 89.8 ml/min (by C-G formula based on Cr of 0.84).   Medications:  Heparin 1200 units/hr  Assessment: 51yof on Heparin bridging to Coumadin for St. Jude MVR. Patient was admitted for acute CVA and Heparin bridge was started due to risk of embolic stroke greater than risk of hemorrhagic stroke per MD. Heparin level (0.46) remains therapeutic - continue current rate. Per MD notes, plan to discontinue Heparin bridge once INR >2. INR (1.84) is subtherapeutic but is trending up. Coumadin PTA regimen 5mg  daily.  - H/H and Plts wnl  - No significant bleeding reported  Goal of Therapy:  Heparin level 0.3-0.5 units/ml  INR 2.5-3.5 (MD requesting upper end of goal 3-3.5) Monitor platelets by anticoagulation protocol: Yes   Plan:  1. Continue Heparin 1200 units/hr (12 ml/hr) 2. Repeat Coumadin 8mg  po x 1 today 3. Follow-up AM INR, CBC and heparin level  Cleon Dew [phone] 09/28/2012,1:05 PM

## 2012-09-28 NOTE — Progress Notes (Signed)
[facility] PROGRESS NOTE  [name] ZOX:[id number] DOB: 07/25/1961 DOA: 09/24/2012 PCP: Ignatius Specking., MD  Brief HPI: [name] is an 51 y.o. female with rheumatic heart disease S/P St. Jude mitral valve in 2007. Who woke up at 6 am on 09/24/12 at baseline. Patient went to [facility] and around 10 am she noted she had a hard time expressing herself and noted her right arm would not move. This occurred for for about one hour and then cleared. This occurred 2 other times over a period of 4 hours. At time of initial ED visit at [facility] her INR was 1.2 and initial CT head was negative for acute stroke or bleed. Patient was transferred to [facility] for further neurological evaluation. At present time she is back to her baseline with no neurological deficits  Patient was not a TPA candidate secondary to out of window. She was admitted to the neuro floor for further evaluation and treatment.   Assessment/Plan: *TIA (transient ischemic attack) and CVA -As discussed above, CT head at [facility] negative.  - MRI shows at least three foci of acute subcentimeter cerebral infarction -Neuro consulted as above, appreciate Dr. Alene Mires assistance-patient started on heparin to bridge till INR therapeutic on coumadin given her mechanical mitral valve.  - INR trending up and last value 1.84  Coumadin per pharmacy consult - Echocardiogram shows EF of 35-40% with grade 1 Diastolic dysfunction. No evidence of mural thrombus reported. - Lovenox injections not an option given that it has been associated with increased risk of hemorrhage in new thrombotic strokes per discussion with neurology.  Therefore heparin should continue to be used until INR 2.0 or above. Target INR 3.0-3.5 given history of mechanical heart valve.  Active Problems:  MITRAL VALVE REPLACEMENT, HX OF, with subtherapeutic INR  -Patient started on IV heparin to bridge till INR therapeutic as discussed with neuro.  -  Please see above.  CAD  -She is chest pain free, continue outpatient medications-including the Plavix as discussed with neuro.   Chronic systolic heart failure  -Compensated, continue ACEI, B blocker.   Leukocytosis  - Resolved and may have been 2ary to stress demargination - U/A on repeat without nitrites or leukocytosis. No complaints of dysuria.  Elevated blood glucose  - Patient prediabetic according to last hgbA1c of 6.4. - Place on diabetic diet and monitor blood sugars. - Continue SSI   Code Status: full Family Communication: Spoke with patient at bedside Disposition Plan: Once INR reaches 2.0 patient will be able to be transitioned to home on coumadin, clopidogrel   Consultants:  Neurology: Dr. Pearlean Brownie  Procedures:  Please refer to chart  Antibiotics:  None  HPI/Subjective: No new complaints this am.  No new focal neurological weaknesses reported.  Objective: Filed Vitals:   09/28/12 0203 09/28/12 0700 09/28/12 1000 09/28/12 1400  BP: 96/47 134/70 138/78 138/75  Pulse: 54 58 65 62  Temp: 97.6 F (36.4 C) 97.5 F (36.4 C) 98.5 F (36.9 C) 97.5 F (36.4 C)  TempSrc: Oral Oral Oral Oral  Resp: 18 20 18 18   Height:      Weight:      SpO2: 98% 99% 98% 98%   No intake or output data in the 24 hours ending 09/28/12 1851 Filed Weights   09/24/12 1500 09/25/12 0500  Weight: 92 kg (202 lb 13.2 oz) 92 kg (202 lb 13.2 oz)    Exam:   General:  Pt in NAD, A and O x 3  Cardiovascular: RRR, no rubs  Respiratory: CTA BL, no wheezes  Abdomen: soft, NT  Neuro: GL grip strength equal, answers questions appropriately  Data Reviewed: Basic Metabolic Panel:  Lab 09/25/12 1914  NA 138  K 3.6  CL 100  CO2 23  GLUCOSE 114*  BUN 12  CREATININE 0.84  CALCIUM 8.8  MG --  PHOS --   Liver Function Tests: No results found for this basename: AST:5,ALT:5,ALKPHOS:5,BILITOT:5,PROT:5,ALBUMIN:5 in the last 168 hours No results found for this basename:  LIPASE:5,AMYLASE:5 in the last 168 hours No results found for this basename: AMMONIA:5 in the last 168 hours CBC:  Lab 09/28/12 0622 09/27/12 0605 09/26/12 0650 09/25/12 0622  WBC 6.8 7.5 10.4 9.2  NEUTROABS -- -- -- --  HGB 13.7 14.1 15.3* 16.3*  HCT 40.2 42.2 44.9 47.9*  MCV 97.8 99.1 98.0 97.0  PLT 227 193 217 250   Cardiac Enzymes: No results found for this basename: CKTOTAL:5,CKMB:5,CKMBINDEX:5,TROPONINI:5 in the last 168 hours BNP (last 3 results) No results found for this basename: PROBNP:3 in the last 8760 hours CBG:  Lab 09/28/12 1723 09/28/12 1201 09/27/12 2201 09/27/12 1614 09/27/12 1159  GLUCAP 162* 139* 112* 99 97    No results found for this or any previous visit (from the past 240 hour(s)).   Studies: No results found.  Scheduled Meds:    . atorvastatin  20 mg Oral q1800  . carvedilol  9.375 mg Oral BID WC  . clopidogrel  75 mg Oral Daily  . enalapril  5 mg Oral BID  . [COMPLETED] HYDROcodone-acetaminophen  1 tablet Oral Once  . insulin aspart  0-9 Units Subcutaneous TID WC  . [COMPLETED] warfarin  8 mg Oral ONCE-1800  . Warfarin - Pharmacist Dosing Inpatient   Does not apply q1800   Continuous Infusions:    . heparin 1,200 Units/hr (09/28/12 0234)    Principal Problem:  *Embolic cerebral infarction, bilateral Active Problems:  CAD  Chronic systolic heart failure  MITRAL VALVE REPLACEMENT, HX OF  Leukocytosis  Blood glucose elevated  CVA (cerebral infarction)    Time spent: > 30 minutes    Penny Pia  [facility] Pager [phone]. If 8PM-8AM, please contact night-coverage at [email or web address], password [facility] 09/28/2012, 6:51 PM  LOS: 4 days

## 2012-09-29 LAB — GLUCOSE, CAPILLARY: Glucose-Capillary: 90 mg/dL (ref 70–99)

## 2012-09-29 LAB — CBC
HCT: 44 % (ref 36.0–46.0)
Hemoglobin: 14.7 g/dL (ref 12.0–15.0)
RBC: 4.44 MIL/uL (ref 3.87–5.11)
RDW: 13.1 % (ref 11.5–15.5)
WBC: 6.4 10*3/uL (ref 4.0–10.5)

## 2012-09-29 LAB — PROTIME-INR: INR: 2.06 — ABNORMAL HIGH (ref 0.00–1.49)

## 2012-09-29 MED ORDER — ATORVASTATIN CALCIUM 20 MG PO TABS: 20.0000 mg | ORAL_TABLET | Freq: Every day | ORAL | Status: DC

## 2012-09-29 MED ORDER — WARFARIN SODIUM 4 MG PO TABS
8.0000 mg | ORAL_TABLET | Freq: Once | ORAL | Status: DC
  Filled 2012-09-29: qty 2

## 2012-09-29 MED ORDER — WARFARIN SODIUM 5 MG PO TABS: 7.5000 mg | ORAL_TABLET | Freq: Every day | ORAL | Status: DC

## 2012-09-29 NOTE — Discharge Summary (Signed)
Physician Discharge Summary  [name] YNW:[id number] DOB: Oct 13, 1961 DOA: 09/24/2012  PCP: Ignatius Specking., MD  Admit date: 09/24/2012 Discharge date: 09/29/2012  Time spent:  35  minutes  Recommendations for Outpatient Follow-up:  1. Dr Sherril Croon in 3 days to check INR 2. Dr Pearlean Brownie in 2 months  Discharge Diagnoses:  Principal Problem:  *Embolic cerebral infarction, bilateral Active Problems:  CAD  Chronic systolic heart failure  MITRAL VALVE REPLACEMENT, HX OF  Leukocytosis  Blood glucose elevated  CVA (cerebral infarction)   Discharge Condition:  stable  Diet recommendation: Low salt. Low fat diet  Filed Weights   09/24/12 1500 09/25/12 0500  Weight: 92 kg (202 lb 13.2 oz) 92 kg (202 lb 13.2 oz)    History of present illness:  [name] is an 51 y.o. female with rheumatic heart disease S/P St. Jude mitral valve in 2007. Who woke up at 6 am on 09/24/12 at baseline. Patient went to [facility] and around 10 am she noted she had a hard time expressing herself and noted her right arm would not move. This occurred for for about one hour and then cleared. This occurred 2 other times over a period of 4 hours. At time of initial ED visit at [facility] her INR was 1.2 and initial CT head was negative for acute stroke or bleed. Patient was transferred to [facility] for further neurological evaluation. At present time she is back to her baseline with no neurological deficits  Patient was not a TPA candidate secondary to out of window. She was admitted to the neuro floor for further evaluation and treatment.      Hospital Course:  CVA , s/p St Jude mitral valve Patient had CT head at the [facility] which was negative, MRI brain showed at least three foci of acute subcentimeter cerebral infarction. Patient's INR was sub therapeutic.No significant neurologic deficits were found. Neurology recommended to continue heparin bridge with po coumadin till the INR is greater than  2.0. Then d/c home on coumadin and plavix. Goal of INR is to be around 3- 3.5. INR is 2.06 today.Will send her home on increased dose of coumadin 7.5 mg po daily and she will get INR checked at Dr Sherril Croon office on Friday in 2 days.  MITRAL VALVE REPLACEMENT, HX OF, with subtherapeutic INR  -Patient started on IV heparin to bridge till INR therapeutic as discussed with neuro. Please see above.   CAD  -She is chest pain free, continue outpatient medications-including the Plavix as discussed with neuro.   Chronic systolic heart failure  -Compensated, continue ACEI, B blocker.   Leukocytosis  - Resolved and may have been secondary to stress demargination   U/A on repeat without nitrites or leukocytosis. No complaints of dysuria.      Consultations: Neurology  Discharge Exam: Filed Vitals:   09/28/12 1400 09/28/12 2121 09/29/12 0606 09/29/12 1036  BP: 138/75 131/70 114/43 142/66  Pulse: 62 58 63 60  Temp: 97.5 F (36.4 C) 98 F (36.7 C) 97.7 F (36.5 C) 98 F (36.7 C)  TempSrc: Oral Oral Oral Oral  Resp: 18 20 18 18   Height:      Weight:      SpO2: 98% 98% 99% 98%    General:Appear in no acute distress Cardiovascular: S1S2 RRR Respiratory: Clear bilaterally Abdomen:soft. nontender Neuro : AO x3  Discharge Instructions  Discharge Orders    Future Orders Please Complete By Expires   Diet - low sodium heart healthy  Increase activity slowly      Discharge instructions      Comments:   Follow Dr Sherril Croon on Friday to check the INR. Goal is to keep the INR 3 -3.5       Medication List     As of 09/29/2012 10:56 AM    TAKE these medications         ALPRAZolam 0.5 MG tablet   Commonly known as: XANAX   Take 0.5 mg by mouth 2 (two) times daily as needed.      atorvastatin 20 MG tablet   Commonly known as: LIPITOR   Take 1 tablet (20 mg total) by mouth daily at 6 PM.      carvedilol 6.25 MG tablet   Commonly known as: COREG   Take 9.375 mg by mouth 2 (two) times  daily with a meal.      clopidogrel 75 MG tablet   Commonly known as: PLAVIX   Take 75 mg by mouth daily.      enalapril 5 MG tablet   Commonly known as: VASOTEC   Take 1 tablet (5 mg total) by mouth 2 (two) times daily.      furosemide 40 MG tablet   Commonly known as: LASIX   Take 40 mg by mouth daily as needed. For sweling      HYDROcodone-acetaminophen 5-500 MG per tablet   Commonly known as: VICODIN   Take 1 tablet by mouth 2 (two) times daily as needed. For pain      NITROSTAT 0.4 MG SL tablet   Generic drug: nitroGLYCERIN   DISSOLVE 1 TABLET UNDER TONGUE AS NEEDED FOR CHEST PAIN EVERY 5 MINUTES UP TO 3 DOSES      NYQUIL PO   Take 1 tablet by mouth at bedtime as needed. For sleep      promethazine 25 MG tablet   Commonly known as: PHENERGAN   Take 1 tablet by mouth Twice daily as needed.      warfarin 5 MG tablet   Commonly known as: COUMADIN   Take 1.5 tablets (7.5 mg total) by mouth daily. As directed per [facility] office      zolpidem 10 MG tablet   Commonly known as: AMBIEN   Take 10 mg by mouth at bedtime. For sleep           Follow-up Information    Follow up with SETHI,PRAMODKUMAR P, MD. In 2 months.   Contact information:   [address], [address] [facility] [place] [address] [phone]           The results of significant diagnostics from this hospitalization (including imaging, microbiology, ancillary and laboratory) are listed below for reference.    Significant Diagnostic Studies: Mr [name] BJ Contrast  09/25/2012  *RADIOLOGY REPORT*  Clinical Data:  Speech difficulty.  Right arm numbness.  History of mitral valve replacement,  history of chronic systolic heart failure.  History of coronary artery disease with hypertension.  MRI HEAD WITHOUT CONTRAST MRA HEAD WITHOUT CONTRAST  Technique:  Multiplanar, multiecho pulse sequences of the brain and surrounding structures were obtained without intravenous contrast.  Angiographic images of the head were obtained using MRA technique without contrast.  Comparison:  09/24/2002 CT head.  No prior visible MR.  MRI HEAD  Findings:  Multiple subcentimeter foci of restricted diffusion indicating acute infarction are identified involving the right posterior frontal and parietal cortex and left parietal cortex/subcortical white matter (images 20 - 22, series  8, arrows). The brainstem and cerebellum are unaffected.  There are no foci of acute or chronic hemorrhage.  There is no mass lesion, hydrocephalus, or extra-axial fluid.  There is no significant atrophy.  Slight prominence perivascular spaces.  Chronic microvascular ischemic change overall with mild nature in the periventricular and subcortical white matter.  Normal pituitary.  Slight cerebellar tonsillar ectopia.  Unremarkable cervical region.  Negative sinus and mastoid regions.  IMPRESSION: At least three foci of acute subcentimeter cerebral infarction. The patient's right arm numbness may relate to a small area of infarction in the left parietal parasagittal cortex.  MRA HEAD  Findings: The internal carotid arteries are widely patent.  The basilar artery is widely patent but somewhat hypoplastic, perhaps related to fetal origin left PCA.  Left vertebral is the sole contributor to the basilar with the right vertebral ending in PICA. There is no proximal stenosis of the middle cerebral arteries or posterior cerebral arteries.  The left proximal A1 ACA is hypoplastic.  There is no visible cerebellar branch occlusion or intracranial aneurysm.  IMPRESSION: No proximal intracranial stenosis of the internal carotid arteries or middle cerebral artery.  Fetal origin left PCA.   Original Report Authenticated By: Davonna Belling, M.D.    Mr Brain Wo Contrast  09/25/2012  *RADIOLOGY REPORT*  Clinical Data:  Speech difficulty.  Right arm numbness.  History of mitral valve replacement,  history of chronic systolic heart failure.  History of  coronary artery disease with hypertension.  MRI HEAD WITHOUT CONTRAST MRA HEAD WITHOUT CONTRAST  Technique:  Multiplanar, multiecho pulse sequences of the brain and surrounding structures were obtained without intravenous contrast. Angiographic images of the head were obtained using MRA technique without contrast.  Comparison:  09/24/2002 CT head.  No prior visible MR.  MRI HEAD  Findings:  Multiple subcentimeter foci of restricted diffusion indicating acute infarction are identified involving the right posterior frontal and parietal cortex and left parietal cortex/subcortical white matter (images 20 - 22, series 8, arrows). The brainstem and cerebellum are unaffected.  There are no foci of acute or chronic hemorrhage.  There is no mass lesion, hydrocephalus, or extra-axial fluid.  There is no significant atrophy.  Slight prominence perivascular spaces.  Chronic microvascular ischemic change overall with mild nature in the periventricular and subcortical white matter.  Normal pituitary.  Slight cerebellar tonsillar ectopia.  Unremarkable cervical region.  Negative sinus and mastoid regions.  IMPRESSION: At least three foci of acute subcentimeter cerebral infarction. The patient's right arm numbness may relate to a small area of infarction in the left parietal parasagittal cortex.  MRA HEAD  Findings: The internal carotid arteries are widely patent.  The basilar artery is widely patent but somewhat hypoplastic, perhaps related to fetal origin left PCA.  Left vertebral is the sole contributor to the basilar with the right vertebral ending in PICA. There is no proximal stenosis of the middle cerebral arteries or posterior cerebral arteries.  The left proximal A1 ACA is hypoplastic.  There is no visible cerebellar branch occlusion or intracranial aneurysm.  IMPRESSION: No proximal intracranial stenosis of the internal carotid arteries or middle cerebral artery.  Fetal origin left PCA.   Original Report Authenticated By:  Davonna Belling, M.D.     Microbiology: No results found for this or any previous visit (from the past 240 hour(s)).   Labs: Basic Metabolic Panel:  Lab 09/25/12 3086  NA 138  K 3.6  CL 100  CO2 23  GLUCOSE 114*  BUN 12  CREATININE 0.84  CALCIUM 8.8  MG --  PHOS --   Liver Function Tests: No results found for this basename: AST:5,ALT:5,ALKPHOS:5,BILITOT:5,PROT:5,ALBUMIN:5 in the last 168 hours No results found for this basename: LIPASE:5,AMYLASE:5 in the last 168 hours No results found for this basename: AMMONIA:5 in the last 168 hours CBC:  Lab 09/29/12 0528 09/28/12 0622 09/27/12 0605 09/26/12 0650 09/25/12 0622  WBC 6.4 6.8 7.5 10.4 9.2  NEUTROABS -- -- -- -- --  HGB 14.7 13.7 14.1 15.3* 16.3*  HCT 44.0 40.2 42.2 44.9 47.9*  MCV 99.1 97.8 99.1 98.0 97.0  PLT 222 227 193 217 250   Cardiac Enzymes: No results found for this basename: CKTOTAL:5,CKMB:5,CKMBINDEX:5,TROPONINI:5 in the last 168 hours BNP: BNP (last 3 results) No results found for this basename: PROBNP:3 in the last 8760 hours CBG:  Lab 09/29/12 0728 09/28/12 2120 09/28/12 1723 09/28/12 1201 09/28/12 0728  GLUCAP 121* 90 162* 139* 100*       Signed:  Deb Loudin S  [facility] 09/29/2012, 10:56 AM

## 2012-09-29 NOTE — Clinical Social Work Psychosocial (Signed)
     Clinical Social Work Department BRIEF PSYCHOSOCIAL ASSESSMENT 09/29/2012  Patient:  [name], [name]     Account Number:  [id number]     Admit date:  09/24/2012  Clinical Social Worker:  Peggyann Shoals  Date/Time:  09/28/2012 04:00 PM  Referred by:  Physician  Date Referred:  09/27/2012 Referred for  Other - See comment   Other Referral:   Other psychosocial issues:  housing concerns   Interview type:  Patient Other interview type:    PSYCHOSOCIAL DATA Living Status:  FAMILY Admitted from facility:   Level of care:   Primary support name:  Keyra Balbi/sister Primary support relationship to patient:  SIBLING Degree of support available:   very supportive    CURRENT CONCERNS Current Concerns  Financial Resources  Other - See comment   Other Concerns:   Housing concerns    SOCIAL WORK ASSESSMENT / PLAN CSW met with pt to address consult. CSW introduced herself and explained role of social work.    Pt shared that she left her boyfriend and stayed with family members while she looked for another place to live. Pt stayed with extended family. When that place no longer became a secure place to live, pt was able to stay with her sister. Pt shared that when she is discharged she will live with her sister and brother in law. Pt stated that she her plan will begin looking for income based housing. Pt shared that she feels confident that she is able to execute her plan and access resources in the community.    CSW will provide additional resources prior to discharge. CSW will continue to follow.   Assessment/plan status:  Psychosocial Support/Ongoing Assessment of Needs Other assessment/ plan:   Information/referral to community resources:   [facility] DSS    PATIENTS/FAMILYS RESPONSE TO PLAN OF CARE: Pt was very pleasant and alert/oriented. Pt is agreeable to discharge plan.

## 2012-09-29 NOTE — Progress Notes (Signed)
ANTICOAGULATION CONSULT NOTE - Follow Up Consult  Pharmacy Consult for Heparin and Coumadin Indication: MVR, acute CVA  Allergies  Allergen Reactions  . Cyclobenzaprine Hcl Nausea And Vomiting  . Latex     Patient Measurements: Height: 5\' 5"  (165.1 cm) Weight: 202 lb 13.2 oz (92 kg) IBW/kg (Calculated) : 57  Heparin Dosing Weight: 78kg  Vital Signs: Temp: 98 F (36.7 C) (11/06 1036) Temp src: Oral (11/06 1036) BP: 142/66 mmHg (11/06 1036) Pulse Rate: 60  (11/06 1036)  Labs:  Basename 09/29/12 0528 09/28/12 0622 09/27/12 0605  HGB 14.7 13.7 --  HCT 44.0 40.2 42.2  PLT 222 227 193  APTT -- -- --  LABPROT 22.4* 20.6* 19.1*  INR 2.06* 1.84* 1.66*  HEPARINUNFRC 0.34 0.46 0.39  CREATININE -- -- --  CKTOTAL -- -- --  CKMB -- -- --  TROPONINI -- -- --    Estimated Creatinine Clearance: 89.8 ml/min (by C-G formula based on Cr of 0.84).   Medications:  Heparin 1200 units/hr  Assessment: 51yof on Heparin bridging to Coumadin for St. Jude MVR. Patient was admitted for acute CVA and Heparin bridge was started due to risk of embolic stroke greater than risk of hemorrhagic stroke per MD. Heparin level (0.34) is therapeutic - MD has discontinued heparin drip since INR is >2 (recommended by Neurology). INR (2.06) is subtherapeutic with increased goal due to MVR but INR is trending up appropriately. Noted and agree with increased Coumadin discharge regimen - 7.5mg  daily with INR follow-up on Friday.  - H/H and Plts stable - No significant bleeding reported  Goal of Therapy:  Heparin level 0.3-0.5 units/ml  INR 2.5-3.5 (MD requesting upper end of goal 3-3.5)  Monitor platelets by anticoagulation protocol: Yes   Plan:  1. Discontinue heparin drip, levels and protocol per MD orders 2. Will order Coumadin 8mg  po x 1 tonight in case discharge delayed  Cleon Dew [phone] 09/29/2012,10:50 AM

## 2012-09-29 NOTE — Clinical Social Work Note (Signed)
Clinical Social Work  CSW met with pt and provided community resources. Pt inquired about further assistance about home repairs to her mother's home. CSW and pt problem solved around accessing resources to help accomplish this. Pt shared that she will be exploring options when returns home.   Pt is ready for discharge and will return home to her sister's home. CSW is signing off as no further needs identified.   Dede Query, MSW, Theresia Majors [phone]

## 2012-09-30 NOTE — Care Management Note (Signed)
    Page 1 of 1   09/30/2012     1:22:43 PM   CARE MANAGEMENT NOTE 09/30/2012  Patient:  [name], [name]   Account Number:  [id number]  Date Initiated:  09/27/2012  Documentation initiated by:  [facility]  Subjective/Objective Assessment:   TIA     Action/Plan:   living with sister currently   Anticipated DC Date:  09/29/2012   Anticipated DC Plan:  HOME/SELF CARE      DC Planning Services  CM consult      Choice offered to / List presented to:             Status of service:  Completed, signed off Medicare Important Message given?   (If response is "NO", the following Medicare IM given date fields will be blank) Date Medicare IM given:   Date Additional Medicare IM given:    Discharge Disposition:  HOME/SELF CARE  Per UR Regulation:  Reviewed for med. necessity/level of care/duration of stay  If discussed at Long Length of Stay Meetings, dates discussed:    Comments:  09/27/2012 1730 NCM spoke to pt and states she is living with her sister temp. She is trying to locate housing after moving from her boyfriend's home. Her daughter is at home with her to assist with her care and financial concerns. Her dtr did go to [facility] to pick up her meds. States she is on a fixed income each month. Receiving disability each month. States added stress of having to move, she missed taking her medications. NCM explained she can follow up with DSS, [employer] and [employer] for possible resources to help her locate housing and paying rent for new apt. NCM will continue to follow up with pt on d/c needs. Isidoro Donning RN CCM Case Mgmt phone [phone]

## 2013-10-12 ENCOUNTER — Encounter: Payer: Self-pay | Admitting: Cardiology

## 2013-11-09 ENCOUNTER — Other Ambulatory Visit: Payer: Self-pay | Admitting: Physician Assistant

## 2013-12-14 ENCOUNTER — Other Ambulatory Visit: Payer: Self-pay | Admitting: Cardiology

## 2014-11-25 ENCOUNTER — Other Ambulatory Visit: Payer: Self-pay | Admitting: Cardiology

## 2015-03-21 ENCOUNTER — Other Ambulatory Visit: Payer: Self-pay | Admitting: Cardiology

## 2015-07-03 ENCOUNTER — Other Ambulatory Visit: Payer: Self-pay | Admitting: Cardiology

## 2016-01-02 ENCOUNTER — Other Ambulatory Visit: Payer: Self-pay | Admitting: Cardiology

## 2016-09-03 ENCOUNTER — Encounter (HOSPITAL_COMMUNITY): Payer: Self-pay | Admitting: General Practice

## 2016-09-03 ENCOUNTER — Inpatient Hospital Stay (HOSPITAL_COMMUNITY)
Admission: AD | Admit: 2016-09-03 | Discharge: 2016-09-12 | DRG: 418 | Disposition: A | Discharge status: Home / Self Care | Payer: Medicaid Other | Source: Other Acute Inpatient Hospital | Attending: Internal Medicine | Admitting: Internal Medicine

## 2016-09-03 DIAGNOSIS — K810 Acute cholecystitis: Secondary | ICD-10-CM | POA: Diagnosis not present

## 2016-09-03 DIAGNOSIS — Z419 Encounter for procedure for purposes other than remedying health state, unspecified: Secondary | ICD-10-CM

## 2016-09-03 DIAGNOSIS — Z9114 Patient's other noncompliance with medication regimen: Secondary | ICD-10-CM

## 2016-09-03 DIAGNOSIS — E669 Obesity, unspecified: Secondary | ICD-10-CM | POA: Diagnosis present

## 2016-09-03 DIAGNOSIS — M797 Fibromyalgia: Secondary | ICD-10-CM | POA: Diagnosis present

## 2016-09-03 DIAGNOSIS — Z6827 Body mass index (BMI) 27.0-27.9, adult: Secondary | ICD-10-CM | POA: Diagnosis not present

## 2016-09-03 DIAGNOSIS — I252 Old myocardial infarction: Secondary | ICD-10-CM | POA: Diagnosis not present

## 2016-09-03 DIAGNOSIS — Z7901 Long term (current) use of anticoagulants: Secondary | ICD-10-CM | POA: Diagnosis not present

## 2016-09-03 DIAGNOSIS — Z720 Tobacco use: Secondary | ICD-10-CM | POA: Diagnosis present

## 2016-09-03 DIAGNOSIS — E785 Hyperlipidemia, unspecified: Secondary | ICD-10-CM | POA: Diagnosis present

## 2016-09-03 DIAGNOSIS — Z8249 Family history of ischemic heart disease and other diseases of the circulatory system: Secondary | ICD-10-CM

## 2016-09-03 DIAGNOSIS — I5042 Chronic combined systolic (congestive) and diastolic (congestive) heart failure: Secondary | ICD-10-CM | POA: Diagnosis not present

## 2016-09-03 DIAGNOSIS — R1013 Epigastric pain: Secondary | ICD-10-CM | POA: Diagnosis present

## 2016-09-03 DIAGNOSIS — I255 Ischemic cardiomyopathy: Secondary | ICD-10-CM | POA: Diagnosis present

## 2016-09-03 DIAGNOSIS — I110 Hypertensive heart disease with heart failure: Secondary | ICD-10-CM | POA: Diagnosis present

## 2016-09-03 DIAGNOSIS — F17210 Nicotine dependence, cigarettes, uncomplicated: Secondary | ICD-10-CM | POA: Diagnosis present

## 2016-09-03 DIAGNOSIS — Z9889 Other specified postprocedural states: Secondary | ICD-10-CM

## 2016-09-03 DIAGNOSIS — D649 Anemia, unspecified: Secondary | ICD-10-CM | POA: Diagnosis present

## 2016-09-03 DIAGNOSIS — Z8673 Personal history of transient ischemic attack (TIA), and cerebral infarction without residual deficits: Secondary | ICD-10-CM | POA: Diagnosis not present

## 2016-09-03 DIAGNOSIS — G8929 Other chronic pain: Secondary | ICD-10-CM | POA: Diagnosis present

## 2016-09-03 DIAGNOSIS — I2510 Atherosclerotic heart disease of native coronary artery without angina pectoris: Secondary | ICD-10-CM

## 2016-09-03 DIAGNOSIS — F411 Generalized anxiety disorder: Secondary | ICD-10-CM | POA: Diagnosis not present

## 2016-09-03 DIAGNOSIS — I5022 Chronic systolic (congestive) heart failure: Secondary | ICD-10-CM | POA: Diagnosis not present

## 2016-09-03 DIAGNOSIS — J449 Chronic obstructive pulmonary disease, unspecified: Secondary | ICD-10-CM | POA: Diagnosis present

## 2016-09-03 DIAGNOSIS — Z952 Presence of prosthetic heart valve: Secondary | ICD-10-CM

## 2016-09-03 DIAGNOSIS — I059 Rheumatic mitral valve disease, unspecified: Secondary | ICD-10-CM

## 2016-09-03 DIAGNOSIS — R112 Nausea with vomiting, unspecified: Secondary | ICD-10-CM

## 2016-09-03 DIAGNOSIS — I340 Nonrheumatic mitral (valve) insufficiency: Secondary | ICD-10-CM | POA: Diagnosis not present

## 2016-09-03 DIAGNOSIS — M549 Dorsalgia, unspecified: Secondary | ICD-10-CM | POA: Diagnosis present

## 2016-09-03 DIAGNOSIS — I099 Rheumatic heart disease, unspecified: Secondary | ICD-10-CM | POA: Diagnosis present

## 2016-09-03 HISTORY — DX: Migraine, unspecified, not intractable, without status migrainosus: G43.909

## 2016-09-03 HISTORY — DX: Unspecified asthma, uncomplicated: J45.909

## 2016-09-03 HISTORY — DX: Spina bifida, unspecified: Q05.9

## 2016-09-03 HISTORY — DX: Other chronic pain: G89.29

## 2016-09-03 HISTORY — DX: Panic disorder (episodic paroxysmal anxiety): F41.0

## 2016-09-03 HISTORY — DX: Dorsalgia, unspecified: M54.9

## 2016-09-03 LAB — PROTIME-INR
INR: 1.87
Prothrombin Time: 21.7 seconds — ABNORMAL HIGH (ref 11.4–15.2)

## 2016-09-03 MED ORDER — ALBUTEROL SULFATE (2.5 MG/3ML) 0.083% IN NEBU: 2.5000 mg | INHALATION_SOLUTION | RESPIRATORY_TRACT | Status: DC | PRN

## 2016-09-03 MED ORDER — LORAZEPAM 2 MG/ML IJ SOLN
0.5000 mg | Freq: Three times a day (TID) | INTRAMUSCULAR | Status: DC | PRN
Start: 2016-09-03 — End: 2016-09-04
  Administered 2016-09-04 (×2): 0.5 mg via INTRAVENOUS
  Filled 2016-09-03 (×2): qty 1

## 2016-09-03 MED ORDER — SODIUM CHLORIDE 0.9 % IV SOLN
INTRAVENOUS | Status: DC
  Administered 2016-09-03 – 2016-09-06 (×5): via INTRAVENOUS

## 2016-09-03 MED ORDER — ONDANSETRON HCL 4 MG/2ML IJ SOLN
4.0000 mg | Freq: Four times a day (QID) | INTRAMUSCULAR | Status: DC | PRN
  Administered 2016-09-04 – 2016-09-12 (×6): 4 mg via INTRAVENOUS
  Filled 2016-09-03 (×6): qty 2

## 2016-09-03 MED ORDER — ONDANSETRON HCL 4 MG PO TABS
4.0000 mg | ORAL_TABLET | Freq: Four times a day (QID) | ORAL | Status: DC | PRN
  Administered 2016-09-08: 4 mg via ORAL
  Filled 2016-09-03: qty 1

## 2016-09-03 MED ORDER — LORAZEPAM 2 MG/ML IJ SOLN: 0.5000 mg | Freq: Four times a day (QID) | INTRAMUSCULAR | Status: DC | PRN

## 2016-09-03 MED ORDER — MORPHINE SULFATE (PF) 2 MG/ML IV SOLN
2.0000 mg | INTRAVENOUS | Status: DC | PRN
  Administered 2016-09-03 – 2016-09-05 (×8): 4 mg via INTRAVENOUS
  Administered 2016-09-05: 2 mg via INTRAVENOUS
  Administered 2016-09-05 – 2016-09-08 (×19): 4 mg via INTRAVENOUS
  Administered 2016-09-09 (×3): 2 mg via INTRAVENOUS
  Administered 2016-09-09: 4 mg via INTRAVENOUS
  Administered 2016-09-09: 2 mg via INTRAVENOUS
  Administered 2016-09-09: 4 mg via INTRAVENOUS
  Administered 2016-09-09 – 2016-09-11 (×8): 2 mg via INTRAVENOUS
  Filled 2016-09-03 (×2): qty 1
  Filled 2016-09-03 (×7): qty 2
  Filled 2016-09-03: qty 1
  Filled 2016-09-03 (×3): qty 2
  Filled 2016-09-03 (×2): qty 1
  Filled 2016-09-03 (×5): qty 2
  Filled 2016-09-03: qty 1
  Filled 2016-09-03: qty 2
  Filled 2016-09-03: qty 1
  Filled 2016-09-03 (×4): qty 2
  Filled 2016-09-03: qty 1
  Filled 2016-09-03 (×3): qty 2
  Filled 2016-09-03: qty 1
  Filled 2016-09-03 (×2): qty 2
  Filled 2016-09-03 (×3): qty 1
  Filled 2016-09-03 (×3): qty 2
  Filled 2016-09-03: qty 1
  Filled 2016-09-03: qty 2

## 2016-09-03 MED ORDER — IPRATROPIUM-ALBUTEROL 0.5-2.5 (3) MG/3ML IN SOLN: 3.0000 mL | RESPIRATORY_TRACT | Status: DC | PRN

## 2016-09-03 NOTE — Progress Notes (Signed)
ANTICOAGULATION CONSULT NOTE - Initial Consult  Pharmacy Consult for Heparin Indication:saint jude MVR   Allergies  Allergen Reactions  . Cyclobenzaprine Hcl Nausea And Vomiting  . Latex    Patient Measurements: Height: 5\' 4"  (162.6 cm) Weight: 160 lb 3.2 oz (72.7 kg) IBW/kg (Calculated) : 54.7 Heparin Dosing Weight: 69.7  Vital Signs: Temp: 98.7 F (37.1 C) (10/11 2123) Temp Source: Oral (10/11 2123) BP: 151/56 (10/11 2123) Pulse Rate: 59 (10/11 2123)  Labs: No results for input(s): HGB, HCT, PLT, APTT, LABPROT, INR, HEPARINUNFRC, HEPRLOWMOCWT, CREATININE, CKTOTAL, CKMB, TROPONINI in the last 72 hours.  CrCl cannot be calculated (Patient's most recent lab result is older than the maximum 21 days allowed.).  Medical History: Past Medical History:  Diagnosis Date  . Anxiety   . Arthritis   . Back pain     chronic  . CHF (congestive heart failure)   . COPD (chronic obstructive pulmonary disease)   . Coronary artery disease   . Coronary disease    Minimal nonobstructive by cardiac catheterization prior to her surgery in 2007, ejection fraction of 55% at that time.  . Fibromyalgia   . Headache   . History of CHF (congestive heart failure)    Ejection fraction 45% not candidate for ICD.  Marland Kitchen. Hypertension   . Myocardial infarction, anterior wall     late reperfusion occluded LAD treated with a bare-metal stent complicated by catheter-induced left mainstem dissection and also treated with a bare-metal stent., initial LV function 25-30% followup echocardiogram ejection fraction 45%   . Rheumatic heart disease    s/p St. Jude mitral valve in 2007  . Shortness of breath   . Stroke 09/24/2012   TIA    Medications:  Prescriptions Prior to Admission  Medication Sig Dispense Refill Last Dose  . ALPRAZolam (XANAX) 0.5 MG tablet Take 0.5 mg by mouth 2 (two) times daily as needed.     09/24/2012 at Unknown  . atorvastatin (LIPITOR) 20 MG tablet Take 1 tablet (20 mg total) by  mouth daily at 6 PM. 30 tablet 2   . carvedilol (COREG) 6.25 MG tablet Take 9.375 mg by mouth 2 (two) times daily with a meal.   Past Week at Unknown  . clopidogrel (PLAVIX) 75 MG tablet Take 75 mg by mouth daily.     Past Week at Unknown  . enalapril (VASOTEC) 5 MG tablet Take 1 tablet (5 mg total) by mouth 2 (two) times daily. 60 tablet 6 Past Week at Unknown  . furosemide (LASIX) 40 MG tablet Take 40 mg by mouth daily as needed. For sweling   Past Month at Unknown  . HYDROcodone-acetaminophen (VICODIN) 5-500 MG per tablet Take 1 tablet by mouth 2 (two) times daily as needed. For pain   09/23/2012 at Unknown  . NITROSTAT 0.4 MG SL tablet DISSOLVE ONE TABLET UNDER TONGUE EVERY 5 MINUTES UP TO 3 DOSES AS NEEDED FOR CHEST PAIN 25 tablet 3   . NITROSTAT 0.4 MG SL tablet DISSOLVE ONE TABLET UNDER TONGUE EVERY 5 MINUTES UP TO 3 DOSES AS NEEDED FOR CHEST PAIN 25 tablet 3   . promethazine (PHENERGAN) 25 MG tablet Take 1 tablet by mouth Twice daily as needed.   Past Month at Unknown  . Pseudoeph-Doxylamine-DM-APAP (NYQUIL PO) Take 1 tablet by mouth at bedtime as needed. For sleep   09/23/2012 at Unknown  . warfarin (COUMADIN) 5 MG tablet Take 1.5 tablets (7.5 mg total) by mouth daily. [facility] 30 [facility]  2   . zolpidem (AMBIEN) 10 MG tablet Take 10 mg by mouth at bedtime. For sleep   Past Week at Unknown   Assessment: [name] is a 55 y.o. female with medical history significant of  rheumatic heart disease s/p St. Jude MVR, CHF, CAD, COPD, anxiety; who presents as a transfer from [facility] with complaints of epigastric abdominal pain. Patient taking warfarin prior to admission, now on hold and pharmacy has been asked to dose heparin when INR < 2.5.  Goal of Therapy:  Heparin level 0.3-0.7 units/ml Monitor platelets by anticoagulation protocol: Yes   Plan:  INR pending Start heparin when INR < 2.5 Monitor INR, heparin level, and CBC   Ruben Im,  PharmD Clinical Pharmacist Pager: [phone] 09/03/2016 10:07 PM

## 2016-09-03 NOTE — Consult Note (Signed)
Reason for Consult:  Acute cholecystitis Referring Physician: Madelyn Flavorsondell Smith, MD  Ellwood DenseMarlana W Daphine DeutscherMartin is an 55 y.o. female.  HPI: This is a 55 yo female with an extensive past medical history anticoagulated on Coumadin for St. Jude's mitral valve who presents in transfer from [facility].  She presents with a one week history of epigastric/ RUQ abdominal pain, associated with nausea, vomiting, diarrhea.  The symptoms are exacerbated by eating.  [facility]/ ultrasound showed signs of acute cholecystitis with gallbladder sludge.  [facility], but she has not seen anybody from [facility] in over 4 years.  She states that she no longer takes Plavix, although it is on her med list.  Past Medical History:  Diagnosis Date  . Anxiety   . Arthritis   . Asthma   . Back pain     chronic  . CHF (congestive heart failure) (HCC)   . COPD (chronic obstructive pulmonary disease) (HCC)   . Coronary artery disease   . Coronary disease    Minimal nonobstructive by cardiac catheterization prior to her surgery in 2007, ejection fraction of 55% at that time.  . Fibromyalgia   . Headache(784.0)   . History of CHF (congestive heart failure)    Ejection fraction 45% not candidate for ICD.  Marland Kitchen. Hypertension   . Myocardial infarction, anterior wall (HCC)     late reperfusion occluded LAD treated with a bare-metal stent complicated by catheter-induced left mainstem dissection and also treated with a bare-metal stent., initial LV function 25-30% followup echocardiogram ejection fraction 45%   . Rheumatic heart disease    s/p St. Jude mitral valve in 2007  . Shortness of breath   . Stroke [facility](HCC) 09/24/2012   TIA    Past Surgical History:  Procedure Laterality Date  . APPENDECTOMY    . CARDIAC CATHETERIZATION  06/2005; 11/2005; 06/2010   Hattie Perch/notes 04/08/2011 &   . CARDIAC VALVE REPLACEMENT  02/2006   St. Jude MVR  . MEDIAN STERNOTOMY     for mitral valve  replacement (25 mm St. Jude mechanical prosthesis.)  . TUBAL LIGATION      Family History  Problem Relation Age of Onset  . Heart attack Father [phone]  . Heart attack Mother [phone]  . Coronary artery disease Sister     Social History:  reports that she has been smoking Cigarettes.  She has a 6.25 pack-year smoking history. She has never used smokeless tobacco. She reports that she does not drink alcohol or use drugs.  Allergies:  Allergies  Allergen Reactions  . Cyclobenzaprine Hcl Nausea And Vomiting  . Latex     Medications:  Prior to Admission medications   Medication Sig Start Date End Date Taking? Authorizing Provider  ALPRAZolam Prudy Feeler(XANAX) 0.5 MG tablet Take 0.5 mg by mouth 2 (two) times daily as needed.      Historical Provider, MD  atorvastatin (LIPITOR) 20 MG tablet Take 1 tablet (20 mg total) by mouth daily at 6 PM. 09/29/12   Meredeth IdeGagan S Lama, MD  carvedilol (COREG) 6.25 MG tablet Take 9.375 mg by mouth 2 (two) times daily with a meal.    Historical Provider, MD  clopidogrel (PLAVIX) 75 MG tablet Take 75 mg by mouth daily.      Historical Provider, MD  enalapril (VASOTEC) 5 MG tablet Take 1 tablet (5 mg total) by mouth 2 (two) times daily. 09/12/11   June LeapGuy E de Gent, MD  furosemide (LASIX)  40 MG tablet Take 40 mg by mouth daily as needed. For sweling    Historical Provider, MD  HYDROcodone-acetaminophen (VICODIN) 5-500 MG per tablet Take 1 tablet by mouth 2 (two) times daily as needed. For pain    Historical Provider, MD  NITROSTAT 0.4 MG SL tablet DISSOLVE ONE TABLET UNDER TONGUE EVERY 5 MINUTES UP TO 3 DOSES AS NEEDED FOR CHEST PAIN 03/22/15   Jonelle Sidle, MD  NITROSTAT 0.4 MG SL tablet DISSOLVE ONE TABLET UNDER TONGUE EVERY 5 MINUTES UP TO 3 DOSES AS NEEDED FOR CHEST PAIN 07/03/15   Jonelle Sidle, MD  promethazine (PHENERGAN) 25 MG tablet Take 1 tablet by mouth Twice daily as needed. 02/26/12   Historical Provider, MD  Pseudoeph-Doxylamine-DM-APAP (NYQUIL PO) Take 1 tablet by mouth  at bedtime as needed. For sleep    Historical Provider, MD  warfarin (COUMADIN) 5 MG tablet Take 1.5 tablets (7.5 mg total) by mouth daily. As directed per [facility] office 09/29/12   Meredeth Ide, MD  zolpidem (AMBIEN) 10 MG tablet Take 10 mg by mouth at bedtime. For sleep    Historical Provider, MD     Results for orders placed or performed during the hospital encounter of 09/03/16 (from the past 48 hour(s))  Protime-INR     Status: Abnormal   Collection Time: 09/03/16 10:24 PM  Result Value Ref Range   Prothrombin Time 21.7 (H) 11.4 - 15.2 seconds   INR 1.87     CT Abd Pelvis - [facility] - 09/02/16 - Circumferential GB wall thickening and edema.  Atherosclerosis, mesenteric disease, aortoiliac disease, fluid-filled colon and rectum.  Ultrasound - [facility] - 09/03/16 - reportedly shows gallbladder sludge, acute cholecystitis; actual report is not available, but these findings reported in transfer notes.  Review of Systems  Constitutional: Negative for weight loss.  HENT: Negative for ear discharge, ear pain, hearing loss and tinnitus.   Eyes: Negative for blurred vision, double vision, photophobia and pain.  Respiratory: Positive for cough. Negative for sputum production and shortness of breath.   Cardiovascular: Negative for chest pain.  Gastrointestinal: Positive for abdominal pain, diarrhea, nausea and vomiting.  Genitourinary: Negative for dysuria, flank pain, frequency and urgency.  Musculoskeletal: Negative for back pain, falls, joint pain, myalgias and neck pain.  Neurological: Negative for dizziness, tingling, sensory change, focal weakness, loss of consciousness and headaches.  Endo/Heme/Allergies: Does not bruise/bleed easily.  Psychiatric/Behavioral: Negative for depression, memory loss and substance abuse. The patient is not nervous/anxious.    Blood pressure (!) 151/56, pulse (!) 59, temperature 98.7 F (37.1 C), temperature source Oral, resp. rate 17, height 5\' 4"  (1.626 m),  weight 72.7 kg (160 lb 3.2 oz), SpO2 99 %. Physical Exam WDWN in NAD - smells of tobacco smoke Eyes:  Pupils equal, round; sclera anicteric HENT:  Oral mucosa moist; good dentition  Neck:  No masses palpated, no thyromegaly Lungs:  CTA bilaterally; normal respiratory effort CV:  Regular rate and rhythm; no murmurs; extremities well-perfused with no edema Abd:  +bowel sounds, soft, moderately tender in epigastrium and RUQ, no palpable organomegaly; no palpable hernias Skin:  Warm, dry; no sign of jaundice Psychiatric - alert and oriented x 4; calm mood and affect  Assessment/Plan: Probable acute cholecystitis - possibly from single stone in cystic duct Multiple medical comorbidities - anticoagulated  Recs:   NPO IV antibiotics Cardiology evaluation in AM for clearance for surgery Hold Coumadin - may switch to Heparin gtt until decision made for surgery Recheck labs  If cardiology feels that she is a candidate for surgery, will discuss laparoscopic cholecystectomy this admission through Heparin window.  Mayrani Khamis K. 09/03/2016, 11:26 PM

## 2016-09-03 NOTE — H&P (Signed)
History and Physical    Brooke BoschMarlana W Sweeney ZOX:[id number] DOB: 1960-12-24 DOA: 09/03/2016  Referring MD/NP/PA: Dr. Konrad DoloresMerrell PCP: Brooke Speckinghruv B Vyas, MD  [facility] from[facility]  Chief Complaint: Epigastric abdominal pain  HPI: [name] is a 55 y.o. female with medical history significant of  rheumatic heart disease s/p St. Jude MVR, CHF, CAD, COPD, anxiety; who presents as a [facility] with complaints of epigastric abdominal pain. Symptoms started approximately 7-8 days ago. With waxing and waning intensity. Associated symptoms having nausea and vomiting. Emesis was of stomach contents, and denies any blood or significant bilious fluid. Tried Dramamine and sipping fluids without relief of symptoms. Patient initially thought symptoms were secondary to GI bu,g but when they did not resolve went [facility]. She was evaluated with CT scan of the abdomen which showed circumferential gallbladder wall thickening and edema with minimal inflammatory changes concerning for developing acute cholecystitis. Ultrasound of the abdomen revealed gallbladder sludge, small hemangioma, and acalculous cholecystitis. Lab work revealed WBC 10.5, hemoglobin 13.2, platelets 239, CMP within normal limits, and INR 1.9 on 10/11. Patient reports being off Coumadin since that time. The patient was given IV fluids and antinausea medication. It is recommended that she undergo a cholecystectomy for which the patient and family requested transfer as her cardiologist Dr.Guy E de Natasha Sweeney and her GI specialist for [facility]. Patient was accepted to be transferred to a MedSurg bed.  ED Course: As seen above.   Review of Systems: As per HPI otherwise 10 point review of systems negative.   Past Medical History:  Diagnosis Date  . Anxiety   . Arthritis   . Back pain     chronic  . CHF (congestive heart failure)   . COPD (chronic  obstructive pulmonary disease)   . Coronary artery disease   . Coronary disease    Minimal nonobstructive by cardiac catheterization prior to her surgery in 2007, ejection fraction of 55% at that time.  . Fibromyalgia   . Headache   . History of CHF (congestive heart failure)    Ejection fraction 45% not candidate for ICD.  [name]. Hypertension   . Myocardial infarction, anterior wall     late reperfusion occluded LAD treated with a bare-metal stent complicated by catheter-induced left mainstem dissection and also treated with a bare-metal stent., initial LV function 25-30% followup echocardiogram ejection fraction 45%   . Rheumatic heart disease    s/p St. Jude mitral valve in 2007  . Shortness of breath   . Stroke 09/24/2012   TIA    Past Surgical History:  Procedure Laterality Date  . APPENDECTOMY    . CARDIAC CATHETERIZATION    . MEDIAN STERNOTOMY     for mitral valve replacement (25 mm St. Jude mechanical prosthesis.)     reports that she has been smoking Cigarettes.  She has a 25.00 pack-year smoking history. She has never used smokeless tobacco. She reports that she does not drink alcohol or use drugs.  Allergies  Allergen Reactions  . Cyclobenzaprine Hcl Nausea And Vomiting  . Latex     Family History  Problem Relation Age of Onset  . Heart attack Mother 4072  . Heart attack Father 2755  . Coronary artery disease Sister     Prior to Admission medications   Medication Sig Start Date End Date Taking? Authorizing Provider  ALPRAZolam Prudy Feeler(XANAX) 0.5 MG tablet Take 0.5 mg by mouth  2 (two) times daily as needed.      Historical Provider, MD  atorvastatin (LIPITOR) 20 MG tablet Take 1 tablet (20 mg total) by mouth daily at 6 PM. 09/29/12   Meredeth Ide, MD  carvedilol (COREG) 6.25 MG tablet Take 9.375 mg by mouth 2 (two) times daily with a meal.    Historical Provider, MD  clopidogrel (PLAVIX) 75 MG tablet Take 75 mg by mouth daily.      Historical Provider, MD  enalapril (VASOTEC)  5 MG tablet Take 1 tablet (5 mg total) by mouth 2 (two) times daily. 09/12/11   June Leap, MD  furosemide (LASIX) 40 MG tablet Take 40 mg by mouth daily as needed. For sweling    Historical Provider, MD  HYDROcodone-acetaminophen (VICODIN) 5-500 MG per tablet Take 1 tablet by mouth 2 (two) times daily as needed. For pain    Historical Provider, MD  NITROSTAT 0.4 MG SL tablet DISSOLVE ONE TABLET UNDER TONGUE EVERY 5 MINUTES UP TO 3 DOSES AS NEEDED FOR CHEST PAIN 03/22/15   Jonelle Sidle, MD  NITROSTAT 0.4 MG SL tablet DISSOLVE ONE TABLET UNDER TONGUE EVERY 5 MINUTES UP TO 3 DOSES AS NEEDED FOR CHEST PAIN 07/03/15   Jonelle Sidle, MD  promethazine (PHENERGAN) 25 MG tablet Take 1 tablet by mouth Twice daily as needed. 02/26/12   Historical Provider, MD  Pseudoeph-Doxylamine-DM-APAP (NYQUIL PO) Take 1 tablet by mouth at bedtime as needed. For sleep    Historical Provider, MD  warfarin (COUMADIN) 5 MG tablet Take 1.5 tablets (7.5 mg total) by mouth daily. As directed per [facility] office 09/29/12   Meredeth Ide, MD  zolpidem (AMBIEN) 10 MG tablet Take 10 mg by mouth at bedtime. For sleep    Historical Provider, MD    Physical Exam:    Constitutional: Obese female in NAD, calm, comfortable Vitals:   09/03/16 1942  BP: (!) 158/58  Pulse: 64  Resp: 17  Temp: 98.6 F (37 C)  TempSrc: Oral  SpO2: 97%  Weight: 72.7 kg (160 lb 3.2 oz)  Height: 5\' 4"  (1.626 m)   Eyes: PERRL, lids and conjunctivae normal ENMT: Mucous membranes are moist. Posterior pharynx clear of any exudate or lesions. normal dentition.  Neck: normal, supple, no masses, no thyromegaly Respiratory: clear to auscultation bilaterally, no wheezing, no crackles. Normal respiratory effort. No accessory muscle use.  Cardiovascular: Regular rate and rhythm, no murmurs / rubs / gallops. No extremity edema. 2+ pedal pulses. No carotid bruits.  Abdomen: Mild to moderate epigastric tenderness, no masses palpated. No hepatosplenomegaly.  Bowel sounds positive.  Musculoskeletal: no clubbing / cyanosis. No joint deformity upper and lower extremities. Good ROM, no contractures. Normal muscle tone.  Skin: no rashes, lesions, ulcers. No induration Neurologic: CN 2-12 grossly intact. Sensation intact, DTR normal. Strength 5/5 in all 4.  Psychiatric: Normal judgment and insight. Alert and oriented x 3. Normal mood.     Labs on Admission: I have personally reviewed following labs and imaging studies  CBC: No results for input(s): WBC, NEUTROABS, HGB, HCT, MCV, PLT in the last 168 hours. Basic Metabolic Panel: No results for input(s): NA, K, CL, CO2, GLUCOSE, BUN, CREATININE, CALCIUM, MG, PHOS in the last 168 hours. GFR: CrCl cannot be calculated (Patient's most recent lab result is older than the maximum 21 days allowed.). Liver Function Tests: No results for input(s): AST, ALT, ALKPHOS, BILITOT, PROT, ALBUMIN in the last 168 hours. No results for input(s): LIPASE, AMYLASE  in the last 168 hours. No results for input(s): AMMONIA in the last 168 hours. Coagulation Profile: No results for input(s): INR, PROTIME in the last 168 hours. Cardiac Enzymes: No results for input(s): CKTOTAL, CKMB, CKMBINDEX, TROPONINI in the last 168 hours. BNP (last 3 results) No results for input(s): PROBNP in the last 8760 hours. HbA1C: No results for input(s): HGBA1C in the last 72 hours. CBG: No results for input(s): GLUCAP in the last 168 hours. Lipid Profile: No results for input(s): CHOL, HDL, LDLCALC, TRIG, CHOLHDL, LDLDIRECT in the last 72 hours. Thyroid Function Tests: No results for input(s): TSH, T4TOTAL, FREET4, T3FREE, THYROIDAB in the last 72 hours. Anemia Panel: No results for input(s): VITAMINB12, FOLATE, FERRITIN, TIBC, IRON, RETICCTPCT in the last 72 hours. Urine analysis:    Component Value Date/Time   COLORURINE YELLOW 09/25/2012 0538   APPEARANCEUR CLOUDY (A) 09/25/2012 0538   LABSPEC 1.024 09/25/2012 0538   PHURINE 5.0  09/25/2012 0538   GLUCOSEU NEGATIVE 09/25/2012 0538   HGBUR LARGE (A) 09/25/2012 0538   BILIRUBINUR NEGATIVE 09/25/2012 0538   KETONESUR 15 (A) 09/25/2012 0538   PROTEINUR NEGATIVE 09/25/2012 0538   UROBILINOGEN 0.2 09/25/2012 0538   NITRITE NEGATIVE 09/25/2012 0538   LEUKOCYTESUR NEGATIVE 09/25/2012 0538   Sepsis Labs: No results found for this or any previous visit (from the past 240 hour(s)).   Radiological Exams on Admission: No results found.  EKG: Independently reviewed. Shows normal sinus rhythm from [place] city records.  Assessment/Plan Acute cholecystitis/epigastric abdominal pain: Patient presented with epigastric abdominal pain with nausea and vomiting. Imaging studies show signs of biliary sludge and acalculus cholecystitis. Imaging reports in chart from [facility]. - Admit to MedSurg bed - NPO per general surgery request - IV fluids normal saline at 75 ml/hr - Morphine prn pain - Gen. surgery consulted at 9:30 pm on 10/10 to Dr. Susy Frizzle Tseui patient to be placed on the list to be evaluated - Cardiology consult placed to be called out in a.m. for cardiac clearance. (Will need to call in a.m. to make sure that this consult has been paged out)  Nausea and vomiting - Zofran prn  Anxiety  - Change Xanax po to Ativan IV prn   Rheumatic heart disease  S/p St. Jude MVR on chronic anticoagulation therapy with subtherapeutic INR: Patient's last INR was found to be 1.9 this morning upon reviewing records. - Check PT/INR - Heparin drip per pharmacy for bridging   Combined CHF: I see up on file showed EF of 35-40% with grade 1 diastolic dysfunction. - Strict ins and outs  Tobacco abuse: Patient declined nicotine patch as she reports that they make her sick. - Also, need a succession of tobacco abuse.  DVT prophylaxis:  heparin  Code Status: Full Family Communication: Discussed overall care with the patient and family present at bedside   Disposition Plan:  likely home once  medically stable  Consults called:  general surgery  Admission status: Inpatient MedSurg   Clydie Braun MD [facility] Pager [phone]  If 7PM-7AM, please contact night-coverage [email or web address] Password [facility]  09/03/2016, 8:36 PM

## 2016-09-04 ENCOUNTER — Encounter (HOSPITAL_COMMUNITY): Payer: Self-pay | Admitting: Physician Assistant

## 2016-09-04 DIAGNOSIS — R112 Nausea with vomiting, unspecified: Secondary | ICD-10-CM | POA: Diagnosis present

## 2016-09-04 DIAGNOSIS — K810 Acute cholecystitis: Principal | ICD-10-CM

## 2016-09-04 DIAGNOSIS — Z720 Tobacco use: Secondary | ICD-10-CM | POA: Diagnosis present

## 2016-09-04 DIAGNOSIS — I5022 Chronic systolic (congestive) heart failure: Secondary | ICD-10-CM

## 2016-09-04 DIAGNOSIS — I2510 Atherosclerotic heart disease of native coronary artery without angina pectoris: Secondary | ICD-10-CM

## 2016-09-04 DIAGNOSIS — F411 Generalized anxiety disorder: Secondary | ICD-10-CM | POA: Diagnosis present

## 2016-09-04 DIAGNOSIS — I059 Rheumatic mitral valve disease, unspecified: Secondary | ICD-10-CM

## 2016-09-04 LAB — COMPREHENSIVE METABOLIC PANEL
ALT: 11 U/L — AB (ref 14–54)
AST: 14 U/L — AB (ref 15–41)
Albumin: 2.6 g/dL — ABNORMAL LOW (ref 3.5–5.0)
Alkaline Phosphatase: 58 U/L (ref 38–126)
Anion gap: 7 (ref 5–15)
BUN: <5 mg/dL — ABNORMAL LOW (ref 6–20)
CHLORIDE: 103 mmol/L (ref 101–111)
CO2: 28 mmol/L (ref 22–32)
Calcium: 8.2 mg/dL — ABNORMAL LOW (ref 8.9–10.3)
Creatinine, Ser: 0.85 mg/dL (ref 0.44–1.00)
Glucose, Bld: 95 mg/dL (ref 65–99)
POTASSIUM: 3.9 mmol/L (ref 3.5–5.1)
SODIUM: 138 mmol/L (ref 135–145)
Total Bilirubin: 0.4 mg/dL (ref 0.3–1.2)
Total Protein: 5.8 g/dL — ABNORMAL LOW (ref 6.5–8.1)

## 2016-09-04 LAB — SURGICAL PCR SCREEN
MRSA, PCR: NEGATIVE
STAPHYLOCOCCUS AUREUS: NEGATIVE

## 2016-09-04 LAB — PROTIME-INR
INR: 1.83
PROTHROMBIN TIME: 21.4 s — AB (ref 11.4–15.2)

## 2016-09-04 LAB — APTT: aPTT: 92 seconds — ABNORMAL HIGH (ref 24–36)

## 2016-09-04 LAB — CBC
HCT: 36.9 % (ref 36.0–46.0)
Hemoglobin: 12.3 g/dL (ref 12.0–15.0)
MCH: 32.1 pg (ref 26.0–34.0)
MCHC: 33.3 g/dL (ref 30.0–36.0)
MCV: 96.3 fL (ref 78.0–100.0)
PLATELETS: 221 10*3/uL (ref 150–400)
RBC: 3.83 MIL/uL — AB (ref 3.87–5.11)
RDW: 13.4 % (ref 11.5–15.5)
WBC: 7.9 10*3/uL (ref 4.0–10.5)

## 2016-09-04 LAB — HEPARIN LEVEL (UNFRACTIONATED)
HEPARIN UNFRACTIONATED: 0.18 [IU]/mL — AB (ref 0.30–0.70)
Heparin Unfractionated: 0.57 IU/mL (ref 0.30–0.70)

## 2016-09-04 MED ORDER — CLONAZEPAM 0.5 MG PO TABS
0.5000 mg | ORAL_TABLET | Freq: Two times a day (BID) | ORAL | Status: DC | PRN
  Administered 2016-09-04 – 2016-09-11 (×13): 0.5 mg via ORAL
  Filled 2016-09-04 (×14): qty 1

## 2016-09-04 MED ORDER — HEPARIN (PORCINE) IN NACL 100-0.45 UNIT/ML-% IJ SOLN
1250.0000 [IU]/h | INTRAMUSCULAR | Status: AC
  Administered 2016-09-04 – 2016-09-06 (×3): 1250 [IU]/h via INTRAVENOUS
  Filled 2016-09-04 (×5): qty 250

## 2016-09-04 MED ORDER — ENALAPRIL MALEATE 5 MG PO TABS
5.0000 mg | ORAL_TABLET | Freq: Two times a day (BID) | ORAL | Status: DC
  Administered 2016-09-04 – 2016-09-12 (×17): 5 mg via ORAL
  Filled 2016-09-04 (×17): qty 1

## 2016-09-04 MED ORDER — ATORVASTATIN CALCIUM 20 MG PO TABS
20.0000 mg | ORAL_TABLET | Freq: Every day | ORAL | Status: DC
  Administered 2016-09-04 – 2016-09-11 (×7): 20 mg via ORAL
  Filled 2016-09-04 (×8): qty 1

## 2016-09-04 MED ORDER — ORAL CARE MOUTH RINSE
15.0000 mL | Freq: Two times a day (BID) | OROMUCOSAL | Status: DC
  Administered 2016-09-04 – 2016-09-11 (×7): 15 mL via OROMUCOSAL

## 2016-09-04 MED ORDER — HEPARIN (PORCINE) IN NACL 100-0.45 UNIT/ML-% IJ SOLN
1000.0000 [IU]/h | INTRAMUSCULAR | Status: DC
  Administered 2016-09-04: 1000 [IU]/h via INTRAVENOUS
  Filled 2016-09-04: qty 250

## 2016-09-04 NOTE — Progress Notes (Signed)
[facility] PROGRESS NOTE  Jackquline BoschMarlana W Sparacino ZOX:[id number] DOB: [birth year] DOA: 09/03/2016   PCP: Ignatius Speckinghruv B Vyas, MD     Assessment/Plan: Principal Problem:   Acute cholecystitis Active Problems:   Chronic systolic heart failure (HCC)   MITRAL VALVE REPLACEMENT, HX OF   Nausea with vomiting   Anxiety state   Tobacco abuse   55 y.o. 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 [facility] showed gallbladder wall thickening and edema, concerning for developing acute cholecystitis. Acalculous cholecystitis dx by US.   Assessment/Plan Acute cholecystitis/epigastric abdominal pain: Patient presented with epigastric abdominal pain with nausea and vomiting. Imaging studies show signs of biliary sludge and acalculus cholecystitis. [facility] Cont tele - NPO per general surgery request - IV fluids normal saline at 75 ml/hr - Morphine prn pain cardiac clearance today for possible laparoscopic cholecystectomy - INR 1.83, coumadin held, continue heparin gtt today until cardiac clearance is obtained  - continue IV antibiotics, antiemetics PRN, IVF  EF was initially 25%, but improved to 35-40%, last echo 2013 repeat echo ordered.  Nausea and vomiting - Zofran prn  Anxiety  - Change Xanax po to Ativan IV prn   Rheumatic heart disease  S/p St. Jude MVR on chronic anticoagulation therapy with subtherapeutic INR: Patient's last INR was found to be 1.9 this morning upon reviewing records. Follow PT/INR - Heparin drip per pharmacy for bridging   Combined CHF: I see up on file showed EF of 35-40% with grade 1 diastolic dysfunction. - Strict ins and outs  Tobacco abuse: Patient declined nicotine patch as she reports that they make her sick. - Also, need a succession of tobacco abuse.     DVT prophylaxsis heparin gtt  Code Status:  Full code      Family Communication: Discussed in detail with the patient, all imaging results, lab results explained to the patient   Disposition Plan:  Cardiology, surgery following       Consultants:  CCS  cardiology  Procedures:  None   Antibiotics: Anti-infectives    None         HPI/Subjective: No cp, sob, no n,v,ap  Objective: Vitals:   09/03/16 1942 09/03/16 2123 09/04/16 0454  BP: (!) 158/58 (!) 151/56 (!) 135/51  Pulse: 64 (!) 59 66  Resp: 17 17 18   Temp: 98.6 F (37 C) 98.7 F (37.1 C) 98.9 F (37.2 C)  TempSrc: Oral Oral Oral  SpO2: 97% 99% 94%  Weight: 72.7 kg (160 lb 3.2 oz)    Height: 5\' 4"  (1.626 m)      Intake/Output Summary (Last 24 hours) at 09/04/16 1115 Last data filed at 09/04/16 0941  Gross per 24 hour  Intake                0 ml  Output             1100 ml  Net            -1100 ml    Exam:  Examination:  General exam: Appears calm and comfortable  Respiratory system: Clear to auscultation. Respiratory effort normal. Cardiovascular system: S1 & S2 heard, RRR. No JVD, murmurs, rubs, gallops or clicks. No pedal edema. Gastrointestinal system: Abdomen is nondistended, soft and nontender. No  organomegaly or masses felt. Normal bowel sounds heard. Central nervous system: Alert and oriented. No focal neurological deficits. Extremities: Symmetric 5 x 5 power. Skin: No rashes, lesions or ulcers Psychiatry: Judgement and insight appear normal. Mood & affect appropriate.     Data Reviewed: I have personally reviewed following labs and imaging studies  Micro Results Recent Results (from the past 240 hour(s))  Surgical pcr screen     Status: None   Collection Time: 09/03/16 10:58 PM  Result Value Ref Range Status   MRSA, PCR NEGATIVE NEGATIVE Final   Staphylococcus aureus NEGATIVE NEGATIVE Final    Comment:        The Xpert SA Assay (FDA approved for NASAL specimens in  patients over 61 years of age), is one component of a comprehensive surveillance program.  Test performance has been validated by [facility] for patients greater than or equal to 40 year old. It is not intended to diagnose infection nor to guide or monitor treatment.     Radiology Reports No results found.   CBC  Recent Labs Lab 09/04/16 0515  WBC 7.9  HGB 12.3  HCT 36.9  PLT 221  MCV 96.3  MCH 32.1  MCHC 33.3  RDW 13.4    Chemistries   Recent Labs Lab 09/04/16 0515  NA 138  K 3.9  CL 103  CO2 28  GLUCOSE 95  BUN <5*  CREATININE 0.85  CALCIUM 8.2*  AST 14*  ALT 11*  ALKPHOS 58  BILITOT 0.4   ------------------------------------------------------------------------------------------------------------------ estimated creatinine clearance is 73.9 mL/min (by C-G formula based on SCr of 0.85 mg/dL). ------------------------------------------------------------------------------------------------------------------ No results for input(s): HGBA1C in the last 72 hours. ------------------------------------------------------------------------------------------------------------------ No results for input(s): CHOL, HDL, LDLCALC, TRIG, CHOLHDL, LDLDIRECT in the last 72 hours. ------------------------------------------------------------------------------------------------------------------ No results for input(s): TSH, T4TOTAL, T3FREE, THYROIDAB in the last 72 hours.  Invalid input(s): FREET3 ------------------------------------------------------------------------------------------------------------------ No results for input(s): VITAMINB12, FOLATE, FERRITIN, TIBC, IRON, RETICCTPCT in the last 72 hours.  Coagulation profile  Recent Labs Lab 09/03/16 2224 09/04/16 0515  INR 1.87 1.83    No results for input(s): DDIMER in the last 72 hours.  Cardiac Enzymes No results for input(s): CKMB, TROPONINI, MYOGLOBIN in the last 168 hours.  Invalid input(s):  CK ------------------------------------------------------------------------------------------------------------------ Invalid input(s): POCBNP   CBG: No results for input(s): GLUCAP in the last 168 hours.     Studies: No results found.    Lab Results  Component Value Date   HGBA1C 6.4 (H) 09/24/2012   HGBA1C (H) 07/04/2010    6.1 (NOTE)                                                                       According to the ADA Clinical Practice Recommendations for 2011, when HbA1c is used as a screening test:   >=6.5%   Diagnostic of Diabetes Mellitus           (if abnormal result  is confirmed)  5.7-6.4%   Increased risk of developing Diabetes Mellitus  References:Diagnosis and Classification of Diabetes Mellitus,Diabetes Care,2011,34(Suppl 1):S62-S69 and Standards of Medical Care in         Diabetes - 2011,Diabetes Care,2011,34  (Suppl 1):S11-S61.   Lab Results  Component Value Date   LDLCALC 170 (  H) 09/25/2012   CREATININE 0.85 09/04/2016       Scheduled Meds: . mouth rinse  15 mL Mouth Rinse BID   Continuous Infusions: . sodium chloride 75 mL/hr at 09/04/16 1025  . heparin 1,250 Units/hr (09/04/16 1025)     LOS: 1 day    Time spent: >30 MINS    [facility]  [facility] Pager [phone]. If 7PM-7AM, please contact night-coverage at [email or web address], password [facility] 09/04/2016, 11:15 AM  LOS: 1 day

## 2016-09-04 NOTE — Consult Note (Addendum)
Dictation #1 [id number]  [id number]      CARDIOLOGY CONSULT NOTE   Patient ID: [name] MRN: [id number] DOB/AGE: 1961/05/28 55 y.o.  Admit date: 09/03/2016  Primary Physician   Ignatius Specking, MD Primary Cardiologist   Was Dr Andee Lineman at [facility] 2013 Reason for Consultation   preop eval Requesting MD: Dr Susie Cassette  UVO:[id number] [name] is a 55 y.o. 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  She developed mid-abd pain w/ N&V a week ago. She went to [facility] where CT showed gallbladder wall thickening and edema, concerning for developing acute cholecystitis. Acalculous cholecystitis dx by [place].  Pt struggles with chronic pain issues from back problems and bone spurs. She walks up 4-5 stairs regularly to get in the house, but has not done a full flight in a long time. Her activity level is limited by MS pain. She has not had CP w/ exertion. She denies problems breathing. She has lost 2 brothers in the last 18 months, and last brother is in the hospital.  She gets upset when she mentions this. She has been taking care of her sister who had LLE BKA, but her sister is getting better.  Pt coumadin is followed by Dr Sherril Croon.  She does not get LE edema, she denies PND, but says she wakes SOB in the night due to panic attacks. Her legs cramp a lot, they wake her at times during the night. She denies orthopnea. In the last 4 years, she has lost 43 pounds, by changing her eating habits   Past Medical History:  Diagnosis Date  . Anxiety   . Arthritis    "qwhere" (09/03/2016)  . Asthma   . CHF (congestive heart failure) (HCC)   . Chronic back pain   . COPD (chronic obstructive pulmonary disease) (HCC)   . Coronary artery disease   . Coronary disease    Minimal nonobstructive by cardiac catheterization prior to her surgery in 2007, ejection fraction of 55% at that time.  . Daily  headache    "daily but really bad @ least 2/wk" (09/03/2016)  . Fibromyalgia   . History of CHF (congestive heart failure)    Ejection fraction 45% not candidate for ICD.  Marland Kitchen History of high cholesterol   . Hypertension   . Migraine    "@ least 2/wk" (09/03/2016)  . Myocardial infarction, anterior wall (HCC)     late reperfusion occluded LAD treated with a bare-metal stent complicated by catheter-induced left mainstem dissection and also treated with a bare-metal stent., initial LV function 25-30% followup echocardiogram ejection fraction 45%   . Panic attacks   . Rheumatic heart disease    s/p St. Jude mitral valve in 2007  . Shortness of breath   . Spina bifida (HCC)   . Stroke [facility]) 09/24/2012   TIA; "issues w/my memory since" (09/03/2016)     Past Surgical History:  Procedure Laterality Date  . APPENDECTOMY    . CARDIAC CATHETERIZATION  06/2005; 11/2005   Hattie Perch 04/08/2011   . CARDIAC VALVE REPLACEMENT  02/2006   St. Jude MVR  . CORONARY ANGIOPLASTY WITH STENT PLACEMENT  06/2010   BMS to the mLAD (culprit vessel) and L main (which dissected during the procedure), non-obs dz CFX & RCA  . MEDIAN STERNOTOMY  02/2006   for mitral valve replacement (25 mm St. Jude mechanical prosthesis.)  .  TUBAL LIGATION      Allergies  Allergen Reactions  . Cyclobenzaprine Hcl Nausea And Vomiting  . Latex Other (See Comments)    Unknown    I have reviewed the patient's current medications . mouth rinse  15 mL Mouth Rinse BID   . sodium chloride 75 mL/hr at 09/03/16 2233  . heparin     ipratropium-albuterol, LORazepam, morphine injection, ondansetron **OR** ondansetron (ZOFRAN) IV  Prior to Admission medications   Medication Sig Start Date End Date Taking? Authorizing Provider  ALPRAZolam Prudy Feeler) 0.5 MG tablet Take 0.5 mg by mouth 2 (two) times daily as needed.      Historical Provider, MD  atorvastatin (LIPITOR) 20 MG tablet Take 1 tablet (20 mg total) by mouth daily at 6 PM. 09/29/12    Meredeth Ide, MD  carvedilol (COREG) 6.25 MG tablet Take 9.375 mg by mouth 2 (two) times daily with a meal.    Historical Provider, MD  clopidogrel (PLAVIX) 75 MG tablet Take 75 mg by mouth daily.      Historical Provider, MD  enalapril (VASOTEC) 5 MG tablet Take 1 tablet (5 mg total) by mouth 2 (two) times daily. 09/12/11   June Leap, MD  furosemide (LASIX) 40 MG tablet Take 40 mg by mouth daily as needed. For sweling    Historical Provider, MD  HYDROcodone-acetaminophen (VICODIN) 5-500 MG per tablet Take 1 tablet by mouth 2 (two) times daily as needed. For pain    Historical Provider, MD  NITROSTAT 0.4 MG SL tablet DISSOLVE ONE TABLET UNDER TONGUE EVERY 5 MINUTES UP TO 3 DOSES AS NEEDED FOR CHEST PAIN 03/22/15   Jonelle Sidle, MD  NITROSTAT 0.4 MG SL tablet DISSOLVE ONE TABLET UNDER TONGUE EVERY 5 MINUTES UP TO 3 DOSES AS NEEDED FOR CHEST PAIN 07/03/15   Jonelle Sidle, MD  promethazine (PHENERGAN) 25 MG tablet Take 1 tablet by mouth Twice daily as needed. 02/26/12   Historical Provider, MD  Pseudoeph-Doxylamine-DM-APAP (NYQUIL PO) Take 1 tablet by mouth at bedtime as needed. For sleep    Historical Provider, MD  warfarin (COUMADIN) 5 MG tablet Take 1.5 tablets (7.5 mg total) by mouth daily. As directed per [facility] office 09/29/12   Meredeth Ide, MD  zolpidem (AMBIEN) 10 MG tablet Take 10 mg by mouth at bedtime. For sleep    Historical Provider, MD     Social History   Social History  . Marital status: Divorced    Spouse name: N/A  . Number of children: N/A  . Years of education: N/A   Occupational History  . Disabled    Social History Main Topics  . Smoking status: Current Every Day Smoker    Packs/day: 0.25    Years: 38.00    Types: Cigarettes  . Smokeless tobacco: Never Used  . Alcohol use Yes     Comment: 09/03/2016 "drank some; quit after my 1st child was born in 94"  . Drug use: No  . Sexual activity: Not Currently    Birth control/ protection: Post-menopausal    Other Topics Concern  . Not on file   Social History Narrative   Lives in [place] with a boyfriend. She is currently disabled.     Family Status  Relation Status  . Father Deceased at age 63  . Mother Deceased at age 54  . Sister    Family History  Problem Relation Age of Onset  . Heart attack Father 41  . Heart attack Mother  72  . Coronary artery disease Sister      ROS:  Full 14 point review of systems complete and found to be negative unless listed above.  Physical Exam: Blood pressure (!) 135/51, pulse 66, temperature 98.9 F (37.2 C), temperature source Oral, resp. rate 18, height 5\' 4"  (1.626 m), weight 160 lb 3.2 oz (72.7 kg), SpO2 94 %.  General: Well developed, well nourished, female in no acute distress Head: Eyes PERRLA, No xanthomas.   Normocephalic and atraumatic, oropharynx without edema or exudate. Dentition: poor Lungs: decreased BS bases but clear Heart: HRRR S1 S2, no rub/gallop, crisp valve click, 2/6 murmur. pulses are 2+ all 4 extrem.   Neck: No carotid bruits. No lymphadenopathy.  JVD not elevated Abdomen: Bowel sounds present, abdomen soft and tender without masses or hernias noted. Msk:  No spine or cva tenderness. No weakness, no joint deformities or effusions. Extremities: No clubbing or cyanosis. No edema.  Neuro: Alert and oriented X 3. No focal deficits noted. Psych:  Good affect, responds appropriately Skin: No rashes or lesions noted.  Labs:   Lab Results  Component Value Date   WBC 7.9 09/04/2016   HGB 12.3 09/04/2016   HCT 36.9 09/04/2016   MCV 96.3 09/04/2016   PLT 221 09/04/2016    Recent Labs  09/04/16 0515  INR 1.83     Recent Labs Lab 09/04/16 0515  NA 138  K 3.9  CL 103  CO2 28  BUN <5*  CREATININE 0.85  CALCIUM 8.2*  PROT 5.8*  BILITOT 0.4  ALKPHOS 58  ALT 11*  AST 14*  GLUCOSE 95  ALBUMIN 2.6*   Lab Results  Component Value Date   CHOL 238 (H) 09/25/2012   HDL 28 (L) 09/25/2012   LDLCALC 170 (H)  09/25/2012   TRIG 200 (H) 09/25/2012   Echo: 09/2012 - Left ventricle: The cavity size was normal. Wall thickness was normal. Systolic function was moderately reduced. The estimated ejection fraction was in the range of 35% to 40%. There is dyskinesis of the mid-distalanteroseptal myocardium. There is akinesis of the distalinferior myocardium. Doppler parameters are consistent with abnormal left ventricular relaxation (grade 1 diastolic dysfunction). No clear evidence of mural thrombus with Definity imaging. - Aortic valve: Mildly calcified annulus. Trileaflet. - Mitral valve: A St. Jude Medical mechanical prosthesis was present. No significant regurgitation. Mean gradient: (D). - Left atrium: The atrium was at the upper limits of normalin size. - Tricuspid valve: Trivial regurgitation. - Pericardium, extracardiac: There was no pericardialeffusion. - Impressions: Although no clearly defined valvular vegetations seen, imaging is difficult particularly with acoustic shadowing in region of the mechanical mitralprosthesis. Impressions: - Although no clearly defined valvular vegetations seen, imaging is difficult particularly with acoustic shadowing in region of the mechanical mitral prosthesis.  ECG:  pending  Cath: 08/20111 NSTEMI w/ mLAD 100% s/p BMS Catheter induced dissection L mainstem rx w/ BMS Non-obs dz in CFX & RCA  Radiology:  No results found.  ASSESSMENT AND PLAN:   The patient was seen today by Dr Mayford Knife, the patient evaluated and the data reviewed.  1. Preop evaluation - pt has no ongoing ischemic sx - she has not had a cardiac evaluation since 2011 when she had BMS to the L main and mid-LAD for NSTEMI - EF was initially 25%, but improved to 35-40%, last echo 2013 - repeat echo ordered. - MD advise if further eval needed, as long as her EF is about the same with no knew  WMAs.  Otherwise, per IM. Principal Problem:   Acute  cholecystitis Active Problems:   Chronic systolic heart failure (HCC)   MITRAL VALVE REPLACEMENT, HX OF   Nausea with vomiting   Anxiety state   Tobacco abuse   Signed: Leanna BattlesBarrett, Rhonda, PA-C 09/04/2016 10:24 AM Beeper [phone]  Co-Sign MD  Patient seen and independently examined with Theodore Demarkhonda Barrett, PA. . We discussed all aspects of the encounter. I agree with the assessment and plan as stated above.  Patient has history of remote MI with occlusion of the mid LAD s/p BMS complicated by dissection retrograde into LM requiring stenting. Has a remote history of St. Jude Mechanical MVR for rheumatic heart disease and is on chronic anticoagulation.  Admitted with acute acalculous cholecystitis and now Cardiology is asked to consult.  She has no problems with anginal CP or SOB but cannot walk any further than 4-5 steps so cannot accurately go by symptoms to assess anginal threshold.  She has not followed up with Cardiology in years.  2D echo pending.  Will get a Lexiscan myoview to rule out ischemia prior to surgery. Coumadin on hold for surgery and on IV heparin.  If nuclear stress test without ischemia and echo with normal LVF and normal functioning MVR, then can proceed with surgery tomorrow afternoon.  We have asked for her stress test to be done first thing in am.  Signed: Armanda Magicraci Turner, MD [facility] 09/04/2016

## 2016-09-04 NOTE — Progress Notes (Addendum)
ANTICOAGULATION CONSULT NOTE - Follow Up Consult  Pharmacy Consult for Heparin (while holding warfarin) Indication: St Jude MVR  Allergies  Allergen Reactions  . Cyclobenzaprine Hcl Nausea And Vomiting  . Latex     Patient Measurements: Height: 5\' 4"  (162.6 cm) Weight: 160 lb 3.2 oz (72.7 kg) IBW/kg (Calculated) : 54.7  Vital Signs: Temp: 98.7 F (37.1 C) (10/11 2123) Temp Source: Oral (10/11 2123) BP: 151/56 (10/11 2123) Pulse Rate: 59 (10/11 2123)  Labs:  Recent Labs  09/03/16 2224  LABPROT 21.7*  INR 1.87    CrCl cannot be calculated (Patient's most recent lab result is older than the maximum 21 days allowed.).  Assessment: Asked to provide heparin bridge until definitive surgery plans are made in pt with St Jude MVR on warfarin PTA. INR is 1.87 so will begin heparin now. Hgb 13.2 at outside hospital per MD notes.   Goal of Therapy:  Heparin level 0.3-0.7 units/ml Monitor platelets by anticoagulation protocol: Yes   Plan:  -Start heparin at 1000 units/hr -0900 HL -Daily CBC/HL -Monitor for bleeding  Abran DukeLedford, Irean Kendricks 09/04/2016,12:58 AM

## 2016-09-04 NOTE — Progress Notes (Signed)
[facility] Progress Note     Subjective: 8/10 epigastric pain that improved with pain medication. Persistent nausea but denies vomiting. +flatus. No BM yet today.   Objective: Vital signs in last 24 hours: Temp:  [98.6 F (37 C)-98.9 F (37.2 C)] 98.9 F (37.2 C) (10/12 0454) Pulse Rate:  [59-66] 66 (10/12 0454) Resp:  [17-18] 18 (10/12 0454) BP: (135-158)/(51-58) 135/51 (10/12 0454) SpO2:  [94 %-99 %] 94 % (10/12 0454) Weight:  [160 lb 3.2 oz (72.7 kg)] 160 lb 3.2 oz (72.7 kg) (10/11 1942) Last BM Date: 09/02/16  Intake/Output from previous day: 10/11 0701 - 10/12 0700 In: 0  Out: 750 [Urine:750] Intake/Output this shift: No intake/output data recorded.  PE: Gen:  Alert, NAD, pleasant Card:  RRR, no M/G/R appeciated, pedal pulses 2+ BL Pulm:  CTA, no W/R/R Abd: Soft, TTP epigastrium without guarding or peritonitis, ND, +BS, Ext:  No erythema, edema, or tenderness   Lab Results:   Recent Labs  09/04/16 0515  WBC 7.9  HGB 12.3  HCT 36.9  PLT 221   BMET  Recent Labs  09/04/16 0515  NA 138  K 3.9  CL 103  CO2 28  GLUCOSE 95  BUN <5*  CREATININE 0.85  CALCIUM 8.2*   PT/INR  Recent Labs  09/03/16 2224 09/04/16 0515  LABPROT 21.7* 21.4*  INR 1.87 1.83   CMP     Component Value Date/Time   NA 138 09/04/2016 0515   K 3.9 09/04/2016 0515   CL 103 09/04/2016 0515   CO2 28 09/04/2016 0515   GLUCOSE 95 09/04/2016 0515   BUN <5 (L) 09/04/2016 0515   CREATININE 0.85 09/04/2016 0515   CALCIUM 8.2 (L) 09/04/2016 0515   PROT 5.8 (L) 09/04/2016 0515   ALBUMIN 2.6 (L) 09/04/2016 0515   AST 14 (L) 09/04/2016 0515   ALT 11 (L) 09/04/2016 0515   ALKPHOS 58 09/04/2016 0515   BILITOT 0.4 09/04/2016 0515   GFRNONAA >60 09/04/2016 0515   GFRAA >60 09/04/2016 0515   Assessment/Plan Acute cholecystitis Epigastric abdominal pain Nausea/Vomiting - CT scan/RUQ U/S significant for biliary sludge and thickened GB wall. Cannot r/o possilble  stone. - WBC, LFT's are WNL - cardiac clearance today for possible laparoscopic cholecystectomy - INR 1.83, coumadin held, continue heparin gtt today until cardiac clearance is obtained  - continue IV antibiotics, antiemetics PRN, IVF   Rheumatic heart disease s/p St.Jude's MVR on chronic coumadin  Anxiety  Tobacco abuse  Dispo: evaluation by cardiology for possible lap chole    LOS: 1 day    Adam PhenixElizabeth S Simaan , [facility] 09/04/2016, 9:25 AM Pager: [phone] Consults: [phone] Mon-Fri 7:00 am-4:30 pm Sat-Sun 7:00 am-11:30 am

## 2016-09-04 NOTE — Progress Notes (Signed)
ANTICOAGULATION CONSULT NOTE - Follow Up Consult  Pharmacy Consult:  Heparin Indication:  St. Jude MVR  Allergies  Allergen Reactions  . Cyclobenzaprine Hcl Nausea And Vomiting  . Latex Other (See Comments)    Unknown    Patient Measurements: Height: 5\' 4"  (162.6 cm) Weight: 160 lb 3.2 oz (72.7 kg) IBW/kg (Calculated) : 54.7 Heparin Dosing Weight: 72 kg  Vital Signs: Temp: 98.4 F (36.9 C) (10/12 1411) Temp Source: Oral (10/12 1411) BP: 144/61 (10/12 1411) Pulse Rate: 62 (10/12 1411)  Labs:  Recent Labs  09/03/16 2224 09/04/16 0515 09/04/16 0836 09/04/16 1634  HGB  --  12.3  --   --   HCT  --  36.9  --   --   PLT  --  221  --   --   APTT  --  92*  --   --   LABPROT 21.7* 21.4*  --   --   INR 1.87 1.83  --   --   HEPARINUNFRC  --   --  0.18* 0.57  CREATININE  --  0.85  --   --     Estimated Creatinine Clearance: 73.9 mL/min (by C-G formula based on SCr of 0.85 mg/dL).     Assessment: 55 YOF on Coumadin PTA for history of St. Jude MVR.  She was transitioned to IV heparin bridge for possible cholecystectomy.   Heparin level therapeutic this PM  Goal of Therapy:  Heparin level 0.3-0.7 units/ml Monitor platelets by anticoagulation protocol: Yes    Plan:  Continue heparin at 1250 units / hr Follow up AM labs  Thank you Okey RegalLisa Keven Soucy, PharmD [phone] 09/04/2016, 5:14 PM

## 2016-09-04 NOTE — Progress Notes (Signed)
MD paged due to patient wanting a different medication for anxiety. Patient has received Ativan. Per patient ," That medicine is not helping my nerves." Will continue to monitor and await return response.

## 2016-09-04 NOTE — Progress Notes (Signed)
ANTICOAGULATION CONSULT NOTE - Follow Up Consult  Pharmacy Consult:  Heparin Indication:  St. Jude MVR  Allergies  Allergen Reactions  . Cyclobenzaprine Hcl Nausea And Vomiting  . Latex Other (See Comments)    Unknown    Patient Measurements: Height: 5\' 4"  (162.6 cm) Weight: 160 lb 3.2 oz (72.7 kg) IBW/kg (Calculated) : 54.7 Heparin Dosing Weight: 72 kg  Vital Signs: Temp: 98.9 F (37.2 C) (10/12 0454) Temp Source: Oral (10/12 0454) BP: 135/51 (10/12 0454) Pulse Rate: 66 (10/12 0454)  Labs:  Recent Labs  09/03/16 2224 09/04/16 0515 09/04/16 0836  HGB  --  12.3  --   HCT  --  36.9  --   PLT  --  221  --   APTT  --  92*  --   LABPROT 21.7* 21.4*  --   INR 1.87 1.83  --   HEPARINUNFRC  --   --  0.18*  CREATININE  --  0.85  --     Estimated Creatinine Clearance: 73.9 mL/min (by C-G formula based on SCr of 0.85 mg/dL).     Assessment: 55 YOF on Coumadin PTA for history of St. Jude MVR.  She was transitioned to IV heparin bridge for possible cholecystectomy.  Heparin level is sub-therapeutic; INR trended down to 1.73; no bleeding reported.  No issue with heparin infusion per RN.   Goal of Therapy:  Heparin level 0.3-0.7 units/ml Monitor platelets by anticoagulation protocol: Yes    Plan:  - Increase heparin gtt to 1250 units/hr - Check 6 hr heparin level - Daily heparin level and CBC   Dashel Goines D. Laney Potashang, PharmD, BCPS Pager:  [phone] 09/04/2016, 10:15 AM

## 2016-09-05 ENCOUNTER — Inpatient Hospital Stay (HOSPITAL_COMMUNITY): Payer: Medicaid Other

## 2016-09-05 DIAGNOSIS — I2510 Atherosclerotic heart disease of native coronary artery without angina pectoris: Secondary | ICD-10-CM

## 2016-09-05 DIAGNOSIS — I340 Nonrheumatic mitral (valve) insufficiency: Secondary | ICD-10-CM

## 2016-09-05 LAB — COMPREHENSIVE METABOLIC PANEL
ALBUMIN: 2.7 g/dL — AB (ref 3.5–5.0)
ALK PHOS: 56 U/L (ref 38–126)
ALT: 11 U/L — ABNORMAL LOW (ref 14–54)
ANION GAP: 12 (ref 5–15)
AST: 17 U/L (ref 15–41)
BILIRUBIN TOTAL: 0.9 mg/dL (ref 0.3–1.2)
CALCIUM: 8 mg/dL — AB (ref 8.9–10.3)
CO2: 22 mmol/L (ref 22–32)
Chloride: 101 mmol/L (ref 101–111)
Creatinine, Ser: 0.93 mg/dL (ref 0.44–1.00)
GFR calc Af Amer: >60 mL/min (ref >60)
GLUCOSE: 71 mg/dL (ref 65–99)
Potassium: 3.7 mmol/L (ref 3.5–5.1)
Sodium: 135 mmol/L (ref 135–145)
TOTAL PROTEIN: 5.7 g/dL — AB (ref 6.5–8.1)

## 2016-09-05 LAB — PROTIME-INR
INR: 1.6
Prothrombin Time: 19.2 seconds — ABNORMAL HIGH (ref 11.4–15.2)

## 2016-09-05 LAB — NM MYOCAR MULTI W/SPECT W/WALL MOTION / EF
CSEPED: 4 min
CSEPEDS: 21 s
CSEPEW: 1 METS
CSEPHR: 56 %
CSEPPHR: 93 {beats}/min
MPHR: 166 {beats}/min
Rest HR: 67 {beats}/min

## 2016-09-05 LAB — ECHOCARDIOGRAM COMPLETE
HEIGHTINCHES: 64 in
WEIGHTICAEL: 2563.2 [oz_av]

## 2016-09-05 LAB — CBC
HEMATOCRIT: 35.2 % — AB (ref 36.0–46.0)
HEMOGLOBIN: 11.9 g/dL — AB (ref 12.0–15.0)
MCH: 32.2 pg (ref 26.0–34.0)
MCHC: 33.8 g/dL (ref 30.0–36.0)
MCV: 95.1 fL (ref 78.0–100.0)
Platelets: 241 10*3/uL (ref 150–400)
RBC: 3.7 MIL/uL — ABNORMAL LOW (ref 3.87–5.11)
RDW: 13 % (ref 11.5–15.5)
WBC: 9.6 10*3/uL (ref 4.0–10.5)

## 2016-09-05 LAB — HEPARIN LEVEL (UNFRACTIONATED): HEPARIN UNFRACTIONATED: 0.42 [IU]/mL (ref 0.30–0.70)

## 2016-09-05 MED ORDER — REGADENOSON 0.4 MG/5ML IV SOLN
INTRAVENOUS | Status: AC
  Administered 2016-09-05: 0.4 mg via INTRAVENOUS
  Filled 2016-09-05: qty 5

## 2016-09-05 MED ORDER — PIPERACILLIN-TAZOBACTAM 3.375 G IVPB
3.3750 g | Freq: Three times a day (TID) | INTRAVENOUS | Status: DC
  Administered 2016-09-05 – 2016-09-12 (×20): 3.375 g via INTRAVENOUS
  Filled 2016-09-05 (×22): qty 50

## 2016-09-05 MED ORDER — PERFLUTREN LIPID MICROSPHERE
INTRAVENOUS | Status: AC
  Filled 2016-09-05: qty 10

## 2016-09-05 MED ORDER — TECHNETIUM TC 99M TETROFOSMIN IV KIT
30.0000 | PACK | Freq: Once | INTRAVENOUS | Status: AC | PRN
  Administered 2016-09-05: 30 via INTRAVENOUS

## 2016-09-05 MED ORDER — TECHNETIUM TC 99M TETROFOSMIN IV KIT
10.0000 | PACK | Freq: Once | INTRAVENOUS | Status: AC | PRN
  Administered 2016-09-05: 10 via INTRAVENOUS

## 2016-09-05 MED ORDER — REGADENOSON 0.4 MG/5ML IV SOLN
0.4000 mg | Freq: Once | INTRAVENOUS | Status: AC
  Administered 2016-09-05: 0.4 mg via INTRAVENOUS
  Filled 2016-09-05: qty 5

## 2016-09-05 MED ORDER — PERFLUTREN LIPID MICROSPHERE
1.0000 mL | INTRAVENOUS | Status: AC | PRN
  Administered 2016-09-05: 2 mL via INTRAVENOUS
  Filled 2016-09-05: qty 10

## 2016-09-05 NOTE — Progress Notes (Signed)
Echocardiogram 2D Echocardiogram has been performed.  09/05/2016 3:36 PM Gertie FeyMichelle Vittorio Mohs, BS, RVT, RDCS, RDMS

## 2016-09-05 NOTE — Progress Notes (Signed)
Patient Name: RADIE BERGES Date of Encounter: 09/05/2016  Primary Cardiologist: She will f/u at [facility] Problem List     Principal Problem:   Acute cholecystitis Active Problems:   Chronic systolic heart failure (HCC)   MITRAL VALVE REPLACEMENT, HX OF   Nausea with vomiting   Anxiety state   Tobacco abuse   Coronary artery disease involving native coronary artery of native heart without angina pectoris   Mitral valve disease, rheumatic     Subjective   Still with abd pain, no SOB or chest pain.  Inpatient Medications    Scheduled Meds: . atorvastatin  20 mg Oral q1800  . enalapril  5 mg Oral BID  . mouth rinse  15 mL Mouth Rinse BID   Continuous Infusions: . sodium chloride 75 mL/hr at 09/04/16 2306  . heparin 1,250 Units/hr (09/04/16 2301)   PRN Meds: clonazePAM, ipratropium-albuterol, morphine injection, ondansetron **OR** ondansetron (ZOFRAN) IV   Vital Signs    Vitals:   09/04/16 1411 09/04/16 2158 09/05/16 0503 09/05/16 0843  BP: (!) 144/61 (!) 170/63 (!) 135/58 (!) 145/52  Pulse: 62 65 71   Resp: 18 17 17    Temp: 98.4 F (36.9 C) 99.1 F (37.3 C) 98.9 F (37.2 C)   TempSrc: Oral Oral Oral   SpO2: 100% 95% 93%   Weight:      Height:        Intake/Output Summary (Last 24 hours) at 09/05/16 0911 Last data filed at 09/05/16 0600  Gross per 24 hour  Intake          2603.54 ml  Output             2400 ml  Net           203.54 ml   Filed Weights   09/03/16 1942  Weight: 160 lb 3.2 oz (72.7 kg)    Physical Exam    GEN: Well nourished, well developed, in no acute distress.  HEENT: Grossly normal.  Neck: Supple, no JVD, carotid bruits, or masses. Cardiac: RRR, + SEM/DM murmurs, no rubs, or gallops. No clubbing, cyanosis, edema.  Radials/DP/PT 2+ and equal bilaterally.  Respiratory:  Respirations regular and unlabored, clear to auscultation bilaterally. GI: Soft, nontender, nondistended, BS + x 4. MS: no deformity or  atrophy. Skin: warm and dry, no rash. Neuro:  Strength and sensation are intact. Psych: AAOx3.  Normal affect.  Labs    CBC  Recent Labs  09/04/16 0515 09/05/16 0509  WBC 7.9 9.6  HGB 12.3 11.9*  HCT 36.9 35.2*  MCV 96.3 95.1  PLT 221 241   Basic Metabolic Panel  Recent Labs  09/04/16 0515 09/05/16 0509  NA 138 135  K 3.9 3.7  CL 103 101  CO2 28 22  GLUCOSE 95 71  BUN <5* <5*  CREATININE 0.85 0.93  CALCIUM 8.2* 8.0*   Liver Function Tests  Recent Labs  09/04/16 0515 09/05/16 0509  AST 14* 17  ALT 11* 11*  ALKPHOS 58 56  BILITOT 0.4 0.9  PROT 5.8* 5.7*  ALBUMIN 2.6* 2.7*    Telemetry    SR, seen in nuc med - Personally Reviewed  Radiology    No results found.  Cardiac Studies   MV pending  Patient Profile     55 y.o. 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 Admitted 10/11  w/ N&V, abdominal pain, needs lap choley, cards clearance.  Assessment & Plan    1. Preop evaluation - pt has no ongoing ischemic sx - she has not had a cardiac evaluation since 2011 when she had BMS to the L main and mid-LAD for NSTEMI - EF was initially 25%, but improved to 35-40%, last echo 2013 - repeat echo ordered, not performed - MV performed, results pending  Otherwise, per IM Principal Problem:   Acute cholecystitis Active Problems:   Chronic systolic heart failure (HCC)   MITRAL VALVE REPLACEMENT, HX OF   Nausea with vomiting   Anxiety state   Tobacco abuse   Coronary artery disease involving native coronary artery of native heart without angina pectoris   Mitral valve disease, rheumatic  .  Signed, Theodore DemarkBarrett, Rhonda, PA-C  09/05/2016, 9:11 AM

## 2016-09-05 NOTE — Progress Notes (Signed)
  Subjective: Lexiscan today for preoperative clearance. Reports that other than having a bit of a restless night's sleep, she did well overnight. Minimal abdominal pain and nausea.   Objective: Vital signs in last 24 hours: Temp:  [98.4 F (36.9 C)-99.1 F (37.3 C)] 98.9 F (37.2 C) (10/13 0503) Pulse Rate:  [62-71] 71 (10/13 0503) Resp:  [17-18] 17 (10/13 0503) BP: (135-170)/(52-63) 151/56 (10/13 0914) SpO2:  [93 %-100 %] 93 % (10/13 0503) Last BM Date: 09/02/16  Intake/Output from previous day: 10/12 0701 - 10/13 0700 In: 2603.5 [I.V.:2603.5] Out: 2400 [Urine:2400] Intake/Output this shift: No intake/output data recorded.  PE: Gen:  Alert, NAD, pleasant, resting in bed Card:  RRR Pulm:  CTA Abd: Soft, mild TTP RUQ without guarding or peritonitis, ND, +BS, Ext:  No erythema, edema, or tenderness   Lab Results:   Recent Labs  09/04/16 0515 09/05/16 0509  WBC 7.9 9.6  HGB 12.3 11.9*  HCT 36.9 35.2*  PLT 221 241   BMET  Recent Labs  09/04/16 0515 09/05/16 0509  NA 138 135  K 3.9 3.7  CL 103 101  CO2 28 22  GLUCOSE 95 71  BUN <5* <5*  CREATININE 0.85 0.93  CALCIUM 8.2* 8.0*   PT/INR  Recent Labs  09/04/16 0515 09/05/16 0509  LABPROT 21.4* 19.2*  INR 1.83 1.60   CMP     Component Value Date/Time   NA 135 09/05/2016 0509   K 3.7 09/05/2016 0509   CL 101 09/05/2016 0509   CO2 22 09/05/2016 0509   GLUCOSE 71 09/05/2016 0509   BUN <5 (L) 09/05/2016 0509   CREATININE 0.93 09/05/2016 0509   CALCIUM 8.0 (L) 09/05/2016 0509   PROT 5.7 (L) 09/05/2016 0509   ALBUMIN 2.7 (L) 09/05/2016 0509   AST 17 09/05/2016 0509   ALT 11 (L) 09/05/2016 0509   ALKPHOS 56 09/05/2016 0509   BILITOT 0.9 09/05/2016 0509   GFRNONAA >60 09/05/2016 0509   GFRAA >60 09/05/2016 0509   Lipase  No results found for: LIPASE     Studies/Results: No results found.  Anti-infectives: Anti-infectives    None       Assessment/Plan Acute  cholecystitis Epigastric abdominal pain Nausea/Vomiting - CT scan/RUQ U/S significant for biliary sludge and thickened GB wall. Cannot r/o possilble stone. - WBC, LFT's are WNL - cardiac clearance today for possible laparoscopic cholecystectomy. Lexiscan completed today. 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   LOS: 2 days    LEE Calianne Larue, [facility] Center[facility]-C [facility] 09/05/2016, 9:39 AM

## 2016-09-05 NOTE — Progress Notes (Addendum)
[facility] PROGRESS NOTE  [name]:[id number] DOB: May 27, 1961 DOA: 09/03/2016   PCP: Ignatius Specking, MD     Assessment/Plan: Principal Problem:   Acute cholecystitis Active Problems:   Chronic systolic heart failure (HCC)   MITRAL VALVE REPLACEMENT, HX OF   Nausea with vomiting   Anxiety state   Tobacco abuse   Coronary artery disease involving native coronary artery of native heart without angina pectoris   Mitral valve disease, rheumatic   55 y.o. 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 She went to [facility] where CT showed gallbladder wall thickening and edema, concerning for developing acute cholecystitis. Acalculous cholecystitis dx by [place].   Assessment/Plan Acute cholecystitis/epigastric abdominal pain: Patient presented with epigastric abdominal pain with nausea and vomiting. Imaging studies show signs of biliary sludge and acalculus cholecystitis. Imaging reports in chart from [facility] Cont tele, shows normal sinus rhythm - NPO per general surgery request - IV fluids normal saline at 75 ml/hr - Morphine prn pain cardiac clearance today for possible laparoscopic cholecystectomy - INR 1.6, coumadin held, continue heparin gtt today until cardiac clearance is obtained , cardiology has ordered [facility]  - continue IV antibiotics, antiemetics PRN, IVF  EF was initially 25%, but improved to 35-40%, last echo 2013, echo today about similar   nuclear stress test grossly abnormal, await further cards recommendations  Nausea and vomiting - Zofran prn  Anxiety  - Change Xanax po to Ativan IV prn   Rheumatic heart disease  S/p St. Jude MVR on chronic anticoagulation therapy with subtherapeutic INR: Patient's last INR was found to be 1.9 this morning upon reviewing records. Follow PT/INR -  Heparin drip per pharmacy for bridging   Combined CHF: I see up on file showed EF of 35-40% with grade 1 diastolic dysfunction. - Strict ins and outs  Tobacco abuse: Patient declined nicotine patch as she reports that they make her sick. - Also, need a succession of tobacco abuse.    DVT prophylaxsis heparin gtt  Code Status:  Full code      Family Communication: Discussed in detail with the patient, all imaging results, lab results explained to the patient   Disposition Plan:  Cardiology, surgery following       Consultants:  CCS  cardiology  Procedures:  None   Antibiotics: Anti-infectives    None         HPI/Subjective: No cp, sob, no n,v,ap  Objective: Vitals:   09/04/16 1411 09/04/16 2158 09/05/16 0503 09/05/16 0843  BP: (!) 144/61 (!) 170/63 (!) 135/58 (!) 145/52  Pulse: 62 65 71   Resp: 18 17 17    Temp: 98.4 F (36.9 C) 99.1 F (37.3 C) 98.9 F (37.2 C)   TempSrc: Oral Oral Oral   SpO2: 100% 95% 93%   Weight:      Height:        Intake/Output Summary (Last 24 hours) at 09/05/16 0846 Last data filed at 09/05/16 0600  Gross per 24 hour  Intake          2603.54 ml  Output             2400 ml  Net           203.54 ml    Exam:  Examination:  General exam: Appears  calm and comfortable  Respiratory system: Clear to auscultation. Respiratory effort normal. Cardiovascular system: S1 & S2 heard, RRR. No JVD, murmurs, rubs, gallops or clicks. No pedal edema. Gastrointestinal system: Abdomen is nondistended, soft and nontender. No organomegaly or masses felt. Normal bowel sounds heard. Central nervous system: Alert and oriented. No focal neurological deficits. Extremities: Symmetric 5 x 5 power. Skin: No rashes, lesions or ulcers Psychiatry: Judgement and insight appear normal. Mood & affect appropriate.     Data Reviewed: I have personally reviewed following labs and imaging studies  Micro Results Recent Results (from the past 240  hour(s))  Surgical pcr screen     Status: None   Collection Time: 09/03/16 10:58 PM  Result Value Ref Range Status   MRSA, PCR NEGATIVE NEGATIVE Final   Staphylococcus aureus NEGATIVE NEGATIVE Final    Comment:        The Xpert SA Assay (FDA approved for NASAL specimens in patients over 54 years of age), is one component of a comprehensive surveillance program.  Test performance has been validated by [facility] for patients greater than or equal to 54 year old. It is not intended to diagnose infection nor to guide or monitor treatment.     Radiology Reports No results found.   CBC  Recent Labs Lab 09/04/16 0515 09/05/16 0509  WBC 7.9 9.6  HGB 12.3 11.9*  HCT 36.9 35.2*  PLT 221 241  MCV 96.3 95.1  MCH 32.1 32.2  MCHC 33.3 33.8  RDW 13.4 13.0    Chemistries   Recent Labs Lab 09/04/16 0515 09/05/16 0509  NA 138 135  K 3.9 3.7  CL 103 101  CO2 28 22  GLUCOSE 95 71  BUN <5* <5*  CREATININE 0.85 0.93  CALCIUM 8.2* 8.0*  AST 14* 17  ALT 11* 11*  ALKPHOS 58 56  BILITOT 0.4 0.9   ------------------------------------------------------------------------------------------------------------------ estimated creatinine clearance is 67.6 mL/min (by C-G formula based on SCr of 0.93 mg/dL). ------------------------------------------------------------------------------------------------------------------ No results for input(s): HGBA1C in the last 72 hours. ------------------------------------------------------------------------------------------------------------------ No results for input(s): CHOL, HDL, LDLCALC, TRIG, CHOLHDL, LDLDIRECT in the last 72 hours. ------------------------------------------------------------------------------------------------------------------ No results for input(s): TSH, T4TOTAL, T3FREE, THYROIDAB in the last 72 hours.  Invalid input(s):  FREET3 ------------------------------------------------------------------------------------------------------------------ No results for input(s): VITAMINB12, FOLATE, FERRITIN, TIBC, IRON, RETICCTPCT in the last 72 hours.  Coagulation profile  Recent Labs Lab 09/03/16 2224 09/04/16 0515 09/05/16 0509  INR 1.87 1.83 1.60    No results for input(s): DDIMER in the last 72 hours.  Cardiac Enzymes No results for input(s): CKMB, TROPONINI, MYOGLOBIN in the last 168 hours.  Invalid input(s): CK ------------------------------------------------------------------------------------------------------------------ Invalid input(s): POCBNP   CBG: No results for input(s): GLUCAP in the last 168 hours.     Studies: No results found.    Lab Results  Component Value Date   HGBA1C 6.4 (H) 09/24/2012   HGBA1C (H) 07/04/2010    6.1 (NOTE)                                                                       According to the ADA Clinical Practice Recommendations for 2011, when HbA1c is used as a screening test:   >=6.5%   Diagnostic of Diabetes Mellitus           (  if abnormal result  is confirmed)  5.7-6.4%   Increased risk of developing Diabetes Mellitus  References:Diagnosis and Classification of Diabetes Mellitus,Diabetes Care,2011,34(Suppl 1):S62-S69 and Standards of Medical Care in         Diabetes - 2011,Diabetes Care,2011,34  (Suppl 1):S11-S61.   Lab Results  Component Value Date   LDLCALC 170 (H) 09/25/2012   CREATININE 0.93 09/05/2016       Scheduled Meds: . regadenoson      . atorvastatin  20 mg Oral q1800  . enalapril  5 mg Oral BID  . mouth rinse  15 mL Mouth Rinse BID   Continuous Infusions: . sodium chloride 75 mL/hr at 09/04/16 2306  . heparin 1,250 Units/hr (09/04/16 2301)     LOS: 2 days    Time spent: >30 MINS    [facility]  [facility] Pager [phone]. If 7PM-7AM, please contact night-coverage at [email or web address], password [facility] 09/05/2016, 8:46  AM  LOS: 2 days

## 2016-09-05 NOTE — Progress Notes (Signed)
ANTICOAGULATION CONSULT NOTE - Follow Up Consult  Pharmacy Consult:  Heparin Indication:  St. Jude MVR  Allergies  Allergen Reactions  . Cyclobenzaprine Hcl Nausea And Vomiting  . Latex Other (See Comments)    Unknown    Patient Measurements: Height: 5\' 4"  (162.6 cm) Weight: 160 lb 3.2 oz (72.7 kg) IBW/kg (Calculated) : 54.7 Heparin Dosing Weight: 72 kg  Vital Signs: Temp: 98.9 F (37.2 C) (10/13 0503) Temp Source: Oral (10/13 0503) BP: 135/58 (10/13 0503) Pulse Rate: 71 (10/13 0503)  Labs:  Recent Labs  09/03/16 2224 09/04/16 0515 09/04/16 0836 09/04/16 1634 09/05/16 0509  HGB  --  12.3  --   --  11.9*  HCT  --  36.9  --   --  35.2*  PLT  --  221  --   --  241  APTT  --  92*  --   --   --   LABPROT 21.7* 21.4*  --   --  19.2*  INR 1.87 1.83  --   --  1.60  HEPARINUNFRC  --   --  0.18* 0.57 0.42  CREATININE  --  0.85  --   --  0.93    Estimated Creatinine Clearance: 67.6 mL/min (by C-G formula based on SCr of 0.93 mg/dL).   Assessment: 55 YOF on Coumadin PTA for history of St. Jude MVR.  She was transitioned to IV heparin bridge for possible cholecystectomy.  Heparin level is therapeutic at 0.42 this morning; INR trended down to 1.6; no bleeding reported, CBC stable.   Goal of Therapy:  Heparin level 0.3-0.7 units/ml Monitor platelets by anticoagulation protocol: Yes   Plan:  - Continue heparin gtt at 1250 units/hr - Daily heparin level and CBC - Monitor for s/sx of bleeding - Follow-up surgery plans   Loura BackJennifer Erie, PharmD, BCPS Clinical Pharmacist Phone for today [phone] Main pharmacy - [phone] 09/05/2016 8:20 AM

## 2016-09-05 NOTE — Progress Notes (Signed)
Lexiscan MV performed, no complications. 1 day study, [place] to read.  Theodore DemarkBarrett, Rhonda, Cordelia Poche-C 09/05/2016 9:18 AM Beeper [phone]

## 2016-09-06 ENCOUNTER — Encounter (HOSPITAL_COMMUNITY)
Admission: AD | Disposition: A | Discharge status: Home / Self Care | Payer: Self-pay | Source: Other Acute Inpatient Hospital | Attending: Internal Medicine

## 2016-09-06 ENCOUNTER — Encounter (HOSPITAL_COMMUNITY): Payer: Self-pay | Admitting: Anesthesiology

## 2016-09-06 DIAGNOSIS — I2510 Atherosclerotic heart disease of native coronary artery without angina pectoris: Secondary | ICD-10-CM

## 2016-09-06 LAB — PROTIME-INR
INR: 1.63
PROTHROMBIN TIME: 19.5 s — AB (ref 11.4–15.2)

## 2016-09-06 LAB — COMPREHENSIVE METABOLIC PANEL
ALBUMIN: 2.7 g/dL — AB (ref 3.5–5.0)
ALK PHOS: 57 U/L (ref 38–126)
ALT: 12 U/L — ABNORMAL LOW (ref 14–54)
ANION GAP: 7 (ref 5–15)
AST: 19 U/L (ref 15–41)
BUN: 7 mg/dL (ref 6–20)
CALCIUM: 8.1 mg/dL — AB (ref 8.9–10.3)
CHLORIDE: 101 mmol/L (ref 101–111)
CO2: 30 mmol/L (ref 22–32)
Creatinine, Ser: 0.89 mg/dL (ref 0.44–1.00)
GFR calc Af Amer: >60 mL/min (ref >60)
GFR calc non Af Amer: >60 mL/min (ref >60)
GLUCOSE: 107 mg/dL — AB (ref 65–99)
Potassium: 3.6 mmol/L (ref 3.5–5.1)
Sodium: 138 mmol/L (ref 135–145)
Total Bilirubin: 1 mg/dL (ref 0.3–1.2)
Total Protein: 5.7 g/dL — ABNORMAL LOW (ref 6.5–8.1)

## 2016-09-06 LAB — CBC
HEMATOCRIT: 36.5 % (ref 36.0–46.0)
Hemoglobin: 12.4 g/dL (ref 12.0–15.0)
MCH: 32.5 pg (ref 26.0–34.0)
MCHC: 34 g/dL (ref 30.0–36.0)
MCV: 95.5 fL (ref 78.0–100.0)
Platelets: 256 10*3/uL (ref 150–400)
RBC: 3.82 MIL/uL — ABNORMAL LOW (ref 3.87–5.11)
RDW: 13.1 % (ref 11.5–15.5)
WBC: 8.4 10*3/uL (ref 4.0–10.5)

## 2016-09-06 LAB — HEPARIN LEVEL (UNFRACTIONATED): Heparin Unfractionated: 0.36 IU/mL (ref 0.30–0.70)

## 2016-09-06 SURGERY — LAPAROSCOPIC CHOLECYSTECTOMY WITH INTRAOPERATIVE CHOLANGIOGRAM
Anesthesia: General

## 2016-09-06 MED ORDER — CARVEDILOL 6.25 MG PO TABS
6.2500 mg | ORAL_TABLET | Freq: Two times a day (BID) | ORAL | Status: DC
  Administered 2016-09-06 – 2016-09-12 (×13): 6.25 mg via ORAL
  Filled 2016-09-06 (×13): qty 1

## 2016-09-06 MED ORDER — PROPOFOL 10 MG/ML IV BOLUS
INTRAVENOUS | Status: AC
  Filled 2016-09-06: qty 40

## 2016-09-06 MED ORDER — MIDAZOLAM HCL 2 MG/2ML IJ SOLN
INTRAMUSCULAR | Status: AC
  Filled 2016-09-06: qty 2

## 2016-09-06 MED ORDER — FENTANYL CITRATE (PF) 100 MCG/2ML IJ SOLN
INTRAMUSCULAR | Status: AC
  Filled 2016-09-06: qty 4

## 2016-09-06 NOTE — Progress Notes (Signed)
[facility] PROGRESS NOTE  [name]:[id number] DOB: 1961-06-05 DOA: 09/03/2016   PCP: Ignatius Specking, MD     Assessment/Plan: Principal Problem:   Acute cholecystitis Active Problems:   Chronic systolic heart failure (HCC)   MITRAL VALVE REPLACEMENT, HX OF   Nausea with vomiting   Anxiety state   Tobacco abuse   Coronary artery disease involving native coronary artery of native heart without angina pectoris   Mitral valve disease, rheumatic   55 y.o. 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 She went to [facility] where CT showed gallbladder wall thickening and edema, concerning for developing acute cholecystitis. Acalculous cholecystitis dx by [place].   Assessment/Plan Acute cholecystitis/epigastric abdominal pain: Patient presented with epigastric abdominal pain with nausea and vomiting. Imaging studies show signs of biliary sludge and acalculus cholecystitis. Imaging reports in chart from [facility]. Cont tele, shows normal sinus rhythm - NPO per general surgery  On 10/15 Hold IV fluids as the patient is eating - Morphine prn pain cardiac clearance  for possible laparoscopic cholecystectomy pending - INR 1.6, coumadin held, continue heparin gtt today until cardiac clearance is obtained ,   - continue IV antibiotics, antiemetics PRN, IVF  EF was initially 25%, but improved to 35-40%, last echo 2013, repeat 2-D echo shows EF of 35-40% with akinesis and extensive scarring  nuclear stress test grossly abnormal, await further cards recommendations,  She is at least moderate risk for surgery.  If medically indicated, proceed and we will follow. 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 Follow PT/INR - Heparin drip per pharmacy for bridging  , hold prior to surgery  Combined CHF: I see up on file showed EF of 35-40% with grade 1 diastolic dysfunction. - Strict ins and outs  Tobacco abuse: Patient declined nicotine patch as she reports that they make her sick. - Also, need a succession of tobacco abuse.    DVT prophylaxsis heparin gtt  Code Status:  Full code      Family Communication: Discussed in detail with the patient, all imaging results, lab results explained to the patient   Disposition Plan:  Cardiology, surgery following       Consultants:  [facility]  cardiology  Procedures:  None   Antibiotics: Anti-infectives    Start     Dose/Rate Route Frequency Ordered Stop   09/05/16 1800  piperacillin-tazobactam (ZOSYN) IVPB 3.375 g     3.375 g 12.5 mL/hr over 240 Minutes Intravenous Every 8 hours 09/05/16 1742           HPI/Subjective: No cp, sob, no n,v,ap  Objective: Vitals:   09/05/16 1408 09/05/16 2124 09/05/16 2129 09/06/16 0523  BP: (!) 126/51 (!) 177/51 (!) 168/55 (!) 127/49  Pulse: (!) 56 66  (!) 58  Resp: 18 18  18   Temp: 98.7 F (37.1 C) 98.1 F (36.7 C)  99.1 F (37.3 C)  TempSrc: Oral Oral  Oral  SpO2: 96% 95%  98%  Weight:      Height:  Intake/Output Summary (Last 24 hours) at 09/06/16 0958 Last data filed at 09/06/16 0526  Gross per 24 hour  Intake           2352.5 ml  Output             2950 ml  Net           -597.5 ml    Exam:  Examination:  General exam: Appears calm and comfortable  Respiratory system: Clear to auscultation. Respiratory effort normal. Cardiovascular system: S1 & S2 heard, RRR. No JVD, murmurs, rubs, gallops or clicks. No pedal edema. Gastrointestinal system: Abdomen is nondistended, soft and nontender. No organomegaly or masses felt. Normal bowel sounds  heard. Central nervous system: Alert and oriented. No focal neurological deficits. Extremities: Symmetric 5 x 5 power. Skin: No rashes, lesions or ulcers Psychiatry: Judgement and insight appear normal. Mood & affect appropriate.     Data Reviewed: I have personally reviewed following labs and imaging studies  Micro Results Recent Results (from the past 240 hour(s))  Surgical pcr screen     Status: None   Collection Time: 09/03/16 10:58 PM  Result Value Ref Range Status   MRSA, PCR NEGATIVE NEGATIVE Final   Staphylococcus aureus NEGATIVE NEGATIVE Final    Comment:        The Xpert SA Assay (FDA approved for NASAL specimens in patients over 12 years of age), is one component of a comprehensive surveillance program.  Test performance has been validated by [facility] for patients greater than or equal to 47 year old. It is not intended to diagnose infection nor to guide or monitor treatment.     Radiology Reports Nm Myocar Multi W/spect W/wall Motion / Ef  Result Date: 09/05/2016 CLINICAL DATA:  Chest pain. Shortness of breath. Congestive heart failure. COPD. Previous myocardial infarct and stroke. Preop evaluation. EXAM: MYOCARDIAL IMAGING WITH SPECT (REST AND PHARMACOLOGIC-STRESS) GATED LEFT VENTRICULAR WALL MOTION STUDY LEFT VENTRICULAR EJECTION FRACTION TECHNIQUE: Standard myocardial SPECT imaging was performed after resting intravenous injection of 10 mCi Tc-103m tetrofosmin. Subsequently, intravenous infusion of Lexiscan was performed under the supervision of the Cardiology staff. At peak effect of the drug, 30 mCi Tc-15m tetrofosmin was injected intravenously and standard myocardial SPECT imaging was performed. Quantitative gated imaging was also performed to evaluate left ventricular wall motion, and estimate left ventricular ejection fraction. COMPARISON:  None. FINDINGS: Perfusion: Extensive decreased myocardial activity is seen on both the stress and resting images  involving the anterior, anteroseptal, and apical walls of the left ventricle, consistent with myocardial infarct. No areas of reversibility are seen to suggest the presence of inducible myocardial ischemia. Wall Motion: Moderate left ventricular dilatation. Global left ventricular hypokinesis. Left Ventricular Ejection Fraction: 6 % End diastolic volume 232 ml End systolic volume 217 ml IMPRESSION: 1. Extensive myocardial infarction involving the anterior, anteroseptal, and apical walls. No evidence of inducible ischemia. 2. Global left ventricular hypokinesis and left ventricular dilatation. 3. Left ventricular ejection fraction 6% 4. Non invasive risk stratification*: High *2012 Appropriate Use Criteria for Coronary Revascularization Focused Update: J Am Coll Cardiol. 2012;59(9):857-881. http://content.dementiazones.com.aspx?articleid=1201161 Electronically Signed   By: Myles Rosenthal M.D.   On: 09/05/2016 14:01     CBC  Recent Labs Lab 09/04/16 0515 09/05/16 0509 09/06/16 0739  WBC 7.9 9.6 8.4  HGB 12.3 11.9* 12.4  HCT 36.9 35.2* 36.5  PLT 221 241 256  MCV 96.3 95.1 95.5  MCH 32.1 32.2 32.5  MCHC 33.3 33.8 34.0  RDW 13.4 13.0 13.1  Chemistries   Recent Labs Lab 09/04/16 0515 09/05/16 0509 09/06/16 0739  NA 138 135 138  K 3.9 3.7 3.6  CL 103 101 101  CO2 28 22 30   GLUCOSE 95 71 107*  BUN <5* <5* 7  CREATININE 0.85 0.93 0.89  CALCIUM 8.2* 8.0* 8.1*  AST 14* 17 19  ALT 11* 11* 12*  ALKPHOS 58 56 57  BILITOT 0.4 0.9 1.0   ------------------------------------------------------------------------------------------------------------------ estimated creatinine clearance is 70.6 mL/min (by C-G formula based on SCr of 0.89 mg/dL). ------------------------------------------------------------------------------------------------------------------ No results for input(s): HGBA1C in the last 72  hours. ------------------------------------------------------------------------------------------------------------------ No results for input(s): CHOL, HDL, LDLCALC, TRIG, CHOLHDL, LDLDIRECT in the last 72 hours. ------------------------------------------------------------------------------------------------------------------ No results for input(s): TSH, T4TOTAL, T3FREE, THYROIDAB in the last 72 hours.  Invalid input(s): FREET3 ------------------------------------------------------------------------------------------------------------------ No results for input(s): VITAMINB12, FOLATE, FERRITIN, TIBC, IRON, RETICCTPCT in the last 72 hours.  Coagulation profile  Recent Labs Lab 09/03/16 2224 09/04/16 0515 09/05/16 0509 09/06/16 0739  INR 1.87 1.83 1.60 1.63    No results for input(s): DDIMER in the last 72 hours.  Cardiac Enzymes No results for input(s): CKMB, TROPONINI, MYOGLOBIN in the last 168 hours.  Invalid input(s): CK ------------------------------------------------------------------------------------------------------------------ Invalid input(s): POCBNP   CBG: No results for input(s): GLUCAP in the last 168 hours.     Studies: Nm Myocar Multi W/spect W/wall Motion / Ef  Result Date: 09/05/2016 CLINICAL DATA:  Chest pain. Shortness of breath. Congestive heart failure. COPD. Previous myocardial infarct and stroke. Preop evaluation. EXAM: MYOCARDIAL IMAGING WITH SPECT (REST AND PHARMACOLOGIC-STRESS) GATED LEFT VENTRICULAR WALL MOTION STUDY LEFT VENTRICULAR EJECTION FRACTION TECHNIQUE: Standard myocardial SPECT imaging was performed after resting intravenous injection of 10 mCi Tc-645m tetrofosmin. Subsequently, intravenous infusion of Lexiscan was performed under the supervision of the Cardiology staff. At peak effect of the drug, 30 mCi Tc-765m tetrofosmin was injected intravenously and standard myocardial SPECT imaging was performed. Quantitative gated imaging was also  performed to evaluate left ventricular wall motion, and estimate left ventricular ejection fraction. COMPARISON:  None. FINDINGS: Perfusion: Extensive decreased myocardial activity is seen on both the stress and resting images involving the anterior, anteroseptal, and apical walls of the left ventricle, consistent with myocardial infarct. No areas of reversibility are seen to suggest the presence of inducible myocardial ischemia. Wall Motion: Moderate left ventricular dilatation. Global left ventricular hypokinesis. Left Ventricular Ejection Fraction: 6 % End diastolic volume 232 ml End systolic volume 217 ml IMPRESSION: 1. Extensive myocardial infarction involving the anterior, anteroseptal, and apical walls. No evidence of inducible ischemia. 2. Global left ventricular hypokinesis and left ventricular dilatation. 3. Left ventricular ejection fraction 6% 4. Non invasive risk stratification*: High *2012 Appropriate Use Criteria for Coronary Revascularization Focused Update: J Am Coll Cardiol. 2012;59(9):857-881. http://content.dementiazones.comonlinejacc.org/article.aspx?articleid=1201161 Electronically Signed   By: Myles RosenthalJohn  Stahl M.D.   On: 09/05/2016 14:01      Lab Results  Component Value Date   HGBA1C 6.4 (H) 09/24/2012   HGBA1C (H) 07/04/2010    6.1 (NOTE)                                                                       According to the ADA Clinical Practice Recommendations for 2011, when HbA1c is used as a screening test:   >=  6.5%   Diagnostic of Diabetes Mellitus           (if abnormal result  is confirmed)  5.7-6.4%   Increased risk of developing Diabetes Mellitus  References:Diagnosis and Classification of Diabetes Mellitus,Diabetes Care,2011,34(Suppl 1):S62-S69 and Standards of Medical Care in         Diabetes - 2011,Diabetes Care,2011,34  (Suppl 1):S11-S61.   Lab Results  Component Value Date   LDLCALC 170 (H) 09/25/2012   CREATININE 0.89 09/06/2016       Scheduled Meds: . atorvastatin  20 mg Oral  q1800  . enalapril  5 mg Oral BID  . mouth rinse  15 mL Mouth Rinse BID  . piperacillin-tazobactam (ZOSYN)  IV  3.375 g Intravenous Q8H   Continuous Infusions: . sodium chloride 75 mL/hr at 09/06/16 0857  . heparin 1,250 Units/hr (09/05/16 1957)     LOS: 3 days    Time spent: >30 MINS    [facility]  [facility] Pager [phone]. If 7PM-7AM, please contact night-coverage at [email or web address], password [facility] 09/06/2016, 9:58 AM  LOS: 3 days

## 2016-09-06 NOTE — Progress Notes (Addendum)
ANTICOAGULATION AND ANTIBIOTIC CONSULT NOTE - Follow Up Consult  Pharmacy Consult:  Heparin; Zosyn Indication:  St. Jude MVR; intra-abdominal infection  Allergies  Allergen Reactions  . Cyclobenzaprine Hcl Nausea And Vomiting  . Latex Other (See Comments)    Unknown    Patient Measurements: Height: 5\' 4"  (162.6 cm) Weight: 160 lb 3.2 oz (72.7 kg) IBW/kg (Calculated) : 54.7 Heparin Dosing Weight: 72 kg  Vital Signs: Temp: 99.1 F (37.3 C) (10/14 0523) Temp Source: Oral (10/14 0523) BP: 127/49 (10/14 0523) Pulse Rate: 58 (10/14 0523)  Labs:  Recent Labs  09/04/16 0515  09/04/16 1634 09/05/16 0509 09/06/16 0739  HGB 12.3  --   --  11.9* 12.4  HCT 36.9  --   --  35.2* 36.5  PLT 221  --   --  241 256  APTT 92*  --   --   --   --   LABPROT 21.4*  --   --  19.2* 19.5*  INR 1.83  --   --  1.60 1.63  HEPARINUNFRC  --   < > 0.57 0.42 0.36  CREATININE 0.85  --   --  0.93  --   < > = values in this interval not displayed.  Estimated Creatinine Clearance: 67.6 mL/min (by C-G formula based on SCr of 0.93 mg/dL).   Assessment: 55 YOF on Coumadin PTA for history of St. Jude MVR.  She was transitioned to IV heparin bridge for possible cholecystectomy, awaiting cardiac clearance.  Heparin level is therapeutic at 0.36 this morning; INR 1.63 today; no bleeding reported, CBC stable.   Late note entry - Pharmacy consulted for Zosyn last night and dose entered but note not written. She is afebrile, WBC are normal, cultures are ngtd.  Goal of Therapy:  Heparin level 0.3-0.7 units/ml Monitor platelets by anticoagulation protocol: Yes   Plan:  - Continue heparin gtt at 1250 units/hr - Daily heparin level and CBC - Monitor for s/sx of bleeding - Possible cholecystectomy today  - Continue Zosyn 3.375 g IV q8h to be infused over 4 hours - Follow-up LOT   Loura BackJennifer Alderson, PharmD, BCPS Clinical Pharmacist Phone for today [phone]- [phone] Main pharmacy - [phone] 09/06/2016 9:00 AM

## 2016-09-06 NOTE — Anesthesia Preprocedure Evaluation (Deleted)
Anesthesia Evaluation  Patient identified by MRN, date of birth, ID band Patient awake    Reviewed: Allergy & Precautions, NPO status , Patient's Chart, lab work & pertinent test results  Airway        Dental   Pulmonary asthma , COPD, Current Smoker,           Cardiovascular hypertension, Pt. on medications (-) angina+ CAD, + Past MI, + Cardiac Stents, + Peripheral Vascular Disease and +CHF    St Jude mech Mitral valve on coumadin, L main stent 2011, S-CHF w/ EF 40% 2013  Echo 09/05/16: Study Conclusions  - Left ventricle: The cavity size was normal. Wall thickness was normal. Systolic function was moderately reduced. The estimated ejection fraction was in the range of 35% to 40%. Akinesis and scarring of the mid-apicalanteroseptal, anterior, and apical myocardium; consistent with infarction in the distribution of the left anterior descending coronary artery. Dyskinesis of the   apical myocardium. No evidence of thrombus. Acoustic contrast opacification revealed no evidence ofthrombus. - Mitral valve: A mechanical prosthesis was present. There was mild regurgitation. Valve area by pressure half-time: 2.4 cm^2. - Left atrium: The atrium was moderately dilated.   Neuro/Psych  Headaches, PSYCHIATRIC DISORDERS Anxiety Spina bifida CVA    GI/Hepatic negative GI ROS, Acute cholecystitis   Endo/Other  negative endocrine ROS  Renal/GU negative Renal ROS     Musculoskeletal  (+) Arthritis , Fibromyalgia -  Abdominal   Peds  Hematology  (+) Blood dyscrasia (warfarin), anemia ,   Anesthesia Other Findings Day of surgery medications reviewed with the patient.  Reproductive/Obstetrics                             Anesthesia Physical Anesthesia Plan  ASA: III  Anesthesia Plan: General   Post-op Pain Management:    Induction: Intravenous  Airway Management Planned: Oral ETT  Additional  Equipment:   Intra-op Plan:   Post-operative Plan: Extubation in OR  Informed Consent: I have reviewed the patients History and Physical, chart, labs and discussed the procedure including the risks, benefits and alternatives for the proposed anesthesia with the patient or authorized representative who has indicated his/her understanding and acceptance.   Dental advisory given  Plan Discussed with: CRNA  Anesthesia Plan Comments: (Risks/benefits of general anesthesia discussed with patient including risk of damage to teeth, lips, gum, and tongue, nausea/vomiting, allergic reactions to medications, and the possibility of heart attack, stroke and death.  All patient questions answered.  Patient wishes to proceed.  Per Dr. Tawana ScaleWilson's note, need Cardiology clearance note prior to proceeding.)        Anesthesia Quick Evaluation

## 2016-09-06 NOTE — Progress Notes (Addendum)
Patient Name: [name] Date of Encounter: 09/06/2016  Primary Cardiologist: none  Hospital Problem List     Principal Problem:   Acute cholecystitis Active Problems:   Chronic systolic heart failure (HCC)   MITRAL VALVE REPLACEMENT, HX OF   Nausea with vomiting   Anxiety state   Tobacco abuse   Coronary artery disease involving native coronary artery of native heart without angina pectoris   Mitral valve disease, rheumatic     Subjective   Still with abd pain, no SOB or chest pain.  Inpatient Medications    Scheduled Meds: . atorvastatin  20 mg Oral q1800  . enalapril  5 mg Oral BID  . mouth rinse  15 mL Mouth Rinse BID  . piperacillin-tazobactam (ZOSYN)  IV  3.375 g Intravenous Q8H   Continuous Infusions: . heparin 1,250 Units/hr (09/05/16 1957)   PRN Meds: clonazePAM, ipratropium-albuterol, morphine injection, ondansetron **OR** ondansetron (ZOFRAN) IV   Vital Signs    Vitals:   09/05/16 2124 09/05/16 2129 09/06/16 0523 09/06/16 1009  BP: (!) 177/51 (!) 168/55 (!) 127/49 (!) 152/64  Pulse: 66  (!) 58   Resp: 18  18   Temp: 98.1 F (36.7 C)  99.1 F (37.3 C)   TempSrc: Oral  Oral   SpO2: 95%  98%   Weight:      Height:        Intake/Output Summary (Last 24 hours) at 09/06/16 1104 Last data filed at 09/06/16 1100  Gross per 24 hour  Intake           2592.5 ml  Output             2950 ml  Net           -357.5 ml   Filed Weights   09/03/16 1942  Weight: 160 lb 3.2 oz (72.7 kg)    Physical Exam    GEN: Well nourished, well developed, in no acute distress.  HEENT: Grossly normal.  Neck: Supple, no JVD, carotid bruits, or masses. Cardiac: RRR, + SEM/DM murmurs, no rubs, or gallops. No clubbing, cyanosis, edema.  Radials/DP/PT 2+ and equal bilaterally.  Respiratory:  Respirations regular and unlabored, clear to auscultation bilaterally. GI: Soft, nontender, nondistended, BS + x 4. MS: no deformity or atrophy. Skin: warm and dry, no  rash. Neuro:  Strength and sensation are intact. Psych: AAOx3.  Normal affect.  Labs    CBC  Recent Labs  09/05/16 0509 09/06/16 0739  WBC 9.6 8.4  HGB 11.9* 12.4  HCT 35.2* 36.5  MCV 95.1 95.5  PLT 241 256   Basic Metabolic Panel  Recent Labs  09/05/16 0509 09/06/16 0739  NA 135 138  K 3.7 3.6  CL 101 101  CO2 22 30  GLUCOSE 71 107*  BUN <5* 7  CREATININE 0.93 0.89  CALCIUM 8.0* 8.1*   Liver Function Tests  Recent Labs  09/05/16 0509 09/06/16 0739  AST 17 19  ALT 11* 12*  ALKPHOS 56 57  BILITOT 0.9 1.0  PROT 5.7* 5.7*  ALBUMIN 2.7* 2.7*    Telemetry    SR, seen in nuc med - Personally Reviewed  Radiology    Nm Myocar Multi W/spect W/wall Motion / Ef  Result Date: 09/05/2016 CLINICAL DATA:  Chest pain. Shortness of breath. Congestive heart failure. COPD. Previous myocardial infarct and stroke. Preop evaluation. EXAM: MYOCARDIAL IMAGING WITH SPECT (REST AND PHARMACOLOGIC-STRESS) GATED LEFT VENTRICULAR WALL MOTION STUDY LEFT VENTRICULAR EJECTION FRACTION TECHNIQUE: Standard myocardial  SPECT imaging was performed after resting intravenous injection of 10 mCi Tc-7456m tetrofosmin. Subsequently, intravenous infusion of Lexiscan was performed under the supervision of the Cardiology staff. At peak effect of the drug, 30 mCi Tc-11056m tetrofosmin was injected intravenously and standard myocardial SPECT imaging was performed. Quantitative gated imaging was also performed to evaluate left ventricular wall motion, and estimate left ventricular ejection fraction. COMPARISON:  None. FINDINGS: Perfusion: Extensive decreased myocardial activity is seen on both the stress and resting images involving the anterior, anteroseptal, and apical walls of the left ventricle, consistent with myocardial infarct. No areas of reversibility are seen to suggest the presence of inducible myocardial ischemia. Wall Motion: Moderate left ventricular dilatation. Global left ventricular hypokinesis.  Left Ventricular Ejection Fraction: 6 % End diastolic volume 232 ml End systolic volume 217 ml IMPRESSION: 1. Extensive myocardial infarction involving the anterior, anteroseptal, and apical walls. No evidence of inducible ischemia. 2. Global left ventricular hypokinesis and left ventricular dilatation. 3. Left ventricular ejection fraction 6% 4. Non invasive risk stratification*: High *2012 Appropriate Use Criteria for Coronary Revascularization Focused Update: J Am Coll Cardiol. 2012;59(9):857-881. http://content.dementiazones.comonlinejacc.org/article.aspx?articleid=1201161 Electronically Signed   By: Myles RosenthalJohn  Stahl M.D.   On: 09/05/2016 14:01    Cardiac Studies   MV pending  Patient Profile     55 y.o. 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 Admitted 10/11 w/ N&V, abdominal pain, needs lap choley, cards clearance.  Assessment & Plan    1. Preop evaluation - myoview consistent with prior anterior MI but no ischemia.  Echo reveals that EF is 35-40%.  I do not see any areas of ischemia that warrant cath.  I do not feel that we could reduce her surgical risks with any additional CV procedures.  She is at least moderate risk for surgery.  If medically indicated, proceed and we will follow. Continue on heparin drip while off of coumadin. Would add perioperative beta blocker.  2. CAD Known CAD with prior LM stent.  Dr Margarita MaileGent's notes from 2013 suggested life long plavix however she has been noncompliant with this for several years. For now, will keep on heparin drip perioperatively. Add beta blocker Smoking cessation!  3. Ischemic CM Will need aggressive outpatient follow-up with uptitration of coreg and ace inhibitor in the outpatient setting Not a candidate for an ICD Importance of compliance stressed to patient.  4. Tobacco Cessation advised  5. S/p mechanical MVR Heparin bridge while off of  coumadin.  6. HTN Very elevated BP Will add coreg Titrate coreg and ace inhibitor as needed.  General cardiology to follow with you. Will see as needed over the weekend.  Signed, Hillis RangeJames Naresh Althaus, MD  09/06/2016, 11:04 AM

## 2016-09-06 NOTE — Progress Notes (Signed)
Day of Surgery  Subjective: No real complaints. Some RUQ pain. No cp/sob. No n/v.  Objective: Vital signs in last 24 hours: Temp:  [98.1 F (36.7 C)-99.1 F (37.3 C)] 99.1 F (37.3 C) (10/14 0523) Pulse Rate:  [56-66] 58 (10/14 0523) Resp:  [18] 18 (10/14 0523) BP: (126-177)/(49-56) 127/49 (10/14 0523) SpO2:  [95 %-98 %] 98 % (10/14 0523) Last BM Date: 09/02/16  Intake/Output from previous day: 10/13 0701 - 10/14 0700 In: 2352.5 [P.O.:240; I.V.:2012.5; IV Piggyback:100] Out: 2950 [Urine:2950] Intake/Output this shift: No intake/output data recorded.  Not ill appearing cta Soft, min RUQ TTP, no rebound/guarding  Lab Results:   Recent Labs  09/05/16 0509 09/06/16 0739  WBC 9.6 8.4  HGB 11.9* 12.4  HCT 35.2* 36.5  PLT 241 256   BMET  Recent Labs  09/04/16 0515 09/05/16 0509  NA 138 135  K 3.9 3.7  CL 103 101  CO2 28 22  GLUCOSE 95 71  BUN <5* <5*  CREATININE 0.85 0.93  CALCIUM 8.2* 8.0*   PT/INR  Recent Labs  09/05/16 0509 09/06/16 0739  LABPROT 19.2* 19.5*  INR 1.60 1.63   ABG No results for input(s): PHART, HCO3 in the last 72 hours.  Invalid input(s): PCO2, PO2  Studies/Results: Nm Myocar Multi W/spect W/wall Motion / Ef  Result Date: 09/05/2016 CLINICAL DATA:  Chest pain. Shortness of breath. Congestive heart failure. COPD. Previous myocardial infarct and stroke. Preop evaluation. EXAM: MYOCARDIAL IMAGING WITH SPECT (REST AND PHARMACOLOGIC-STRESS) GATED LEFT VENTRICULAR WALL MOTION STUDY LEFT VENTRICULAR EJECTION FRACTION TECHNIQUE: Standard myocardial SPECT imaging was performed after resting intravenous injection of 10 mCi Tc-5m tetrofosmin. Subsequently, intravenous infusion of Lexiscan was performed under the supervision of the Cardiology staff. At peak effect of the drug, 30 mCi Tc-71m tetrofosmin was injected intravenously and standard myocardial SPECT imaging was performed. Quantitative gated imaging was also performed to evaluate left  ventricular wall motion, and estimate left ventricular ejection fraction. COMPARISON:  None. FINDINGS: Perfusion: Extensive decreased myocardial activity is seen on both the stress and resting images involving the anterior, anteroseptal, and apical walls of the left ventricle, consistent with myocardial infarct. No areas of reversibility are seen to suggest the presence of inducible myocardial ischemia. Wall Motion: Moderate left ventricular dilatation. Global left ventricular hypokinesis. Left Ventricular Ejection Fraction: 6 % End diastolic volume 232 ml End systolic volume 217 ml IMPRESSION: 1. Extensive myocardial infarction involving the anterior, anteroseptal, and apical walls. No evidence of inducible ischemia. 2. Global left ventricular hypokinesis and left ventricular dilatation. 3. Left ventricular ejection fraction 6% 4. Non invasive risk stratification*: High *2012 Appropriate Use Criteria for Coronary Revascularization Focused Update: J Am Coll Cardiol. 2012;59(9):857-881. http://content.dementiazones.com.aspx?articleid=1201161 Electronically Signed   By: Myles Rosenthal M.D.   On: 09/05/2016 14:01    Anti-infectives: Anti-infectives    Start     Dose/Rate Route Frequency Ordered Stop   09/05/16 1800  piperacillin-tazobactam (ZOSYN) IVPB 3.375 g     3.375 g 12.5 mL/hr over 240 Minutes Intravenous Every 8 hours 09/05/16 1742        Assessment/Plan: Acute cholecystitis CAD St jude valve HTN  Will not be able to proceed with lap chole today. A verbal report that pt is safe for surgery from cardiac standpoint unfortunately isn't acceptable to me or anesthesia when her cardiac stress test is officially read as grossly abnormal and high risk.   Will need documentation from cardiology about pt's risk for surgery. I understand that echo showed better heart function than  her stress test but really need their opinion documented about risk for surgery. Discussed with a cardiology APP on 6c.    Therefore, will resume diet Cont IV abx Will make NPO p MN for possible surgery in am Will write to hold hep gtt at 0300 Sunday for prob lap chole Pt informed and understands  Mary Sellaric M. Andrey CampanileWilson, MD, FACS General, Bariatric, & Minimally Invasive Surgery [facility], [place]   LOS: 3 days    Atilano InaWILSON,Fraida Veldman M 09/06/2016

## 2016-09-07 ENCOUNTER — Encounter (HOSPITAL_COMMUNITY): Payer: Self-pay | Admitting: Anesthesiology

## 2016-09-07 ENCOUNTER — Encounter (HOSPITAL_COMMUNITY)
Admission: AD | Disposition: A | Discharge status: Home / Self Care | Payer: Self-pay | Source: Other Acute Inpatient Hospital | Attending: Internal Medicine

## 2016-09-07 LAB — HEPARIN LEVEL (UNFRACTIONATED): HEPARIN UNFRACTIONATED: 0.42 [IU]/mL (ref 0.30–0.70)

## 2016-09-07 LAB — CBC
HCT: 34.9 % — ABNORMAL LOW (ref 36.0–46.0)
Hemoglobin: 11.7 g/dL — ABNORMAL LOW (ref 12.0–15.0)
MCH: 32 pg (ref 26.0–34.0)
MCHC: 33.5 g/dL (ref 30.0–36.0)
MCV: 95.4 fL (ref 78.0–100.0)
PLATELETS: 262 10*3/uL (ref 150–400)
RBC: 3.66 MIL/uL — AB (ref 3.87–5.11)
RDW: 13.2 % (ref 11.5–15.5)
WBC: 9.9 10*3/uL (ref 4.0–10.5)

## 2016-09-07 LAB — PROTIME-INR
INR: 1.29
PROTHROMBIN TIME: 16.2 s — AB (ref 11.4–15.2)

## 2016-09-07 SURGERY — LAPAROSCOPIC CHOLECYSTECTOMY WITH INTRAOPERATIVE CHOLANGIOGRAM
Anesthesia: General

## 2016-09-07 MED ORDER — HEPARIN (PORCINE) IN NACL 100-0.45 UNIT/ML-% IJ SOLN
1250.0000 [IU]/h | INTRAMUSCULAR | Status: AC
  Administered 2016-09-07: 1250 [IU]/h via INTRAVENOUS
  Filled 2016-09-07 (×3): qty 250

## 2016-09-07 MED ORDER — HEPARIN (PORCINE) IN NACL 100-0.45 UNIT/ML-% IJ SOLN: 1250.0000 [IU]/h | INTRAMUSCULAR | Status: DC

## 2016-09-07 NOTE — Progress Notes (Signed)
  Subjective: Pt with no c/o this AM Heparin gtt ongoing this AM, order placed to hold it this AM  Objective: Vital signs in last 24 hours: Temp:  [97.7 F (36.5 C)-99 F (37.2 C)] 99 F (37.2 C) (10/15 0524) Pulse Rate:  [49-69] 57 (10/15 0524) Resp:  [18] 18 (10/15 0524) BP: (149-160)/(62-81) 160/62 (10/15 0524) SpO2:  [92 %-99 %] 92 % (10/15 0524) Last BM Date: 09/04/16  Intake/Output from previous day: 10/14 0701 - 10/15 0700 In: 960 [P.O.:960] Out: 1425 [Urine:1425] Intake/Output this shift: No intake/output data recorded.  General appearance: alert and cooperative GI: soft, non-tender; bowel sounds normal; no masses,  no organomegaly  Lab Results:   Recent Labs  09/06/16 0739 09/07/16 0507  WBC 8.4 9.9  HGB 12.4 11.7*  HCT 36.5 34.9*  PLT 256 262   BMET  Recent Labs  09/05/16 0509 09/06/16 0739  NA 135 138  K 3.7 3.6  CL 101 101  CO2 22 30  GLUCOSE 71 107*  BUN <5* 7  CREATININE 0.93 0.89  CALCIUM 8.0* 8.1*   PT/INR  Recent Labs  09/06/16 0739 09/07/16 0507  LABPROT 19.5* 16.2*  INR 1.63 1.29   ABG No results for input(s): PHART, HCO3 in the last 72 hours.  Invalid input(s): PCO2, PO2  Studies/Results: Nm Myocar Multi W/spect W/wall Motion / Ef  Result Date: 09/05/2016 CLINICAL DATA:  Chest pain. Shortness of breath. Congestive heart failure. COPD. Previous myocardial infarct and stroke. Preop evaluation. EXAM: MYOCARDIAL IMAGING WITH SPECT (REST AND PHARMACOLOGIC-STRESS) GATED LEFT VENTRICULAR WALL MOTION STUDY LEFT VENTRICULAR EJECTION FRACTION TECHNIQUE: Standard myocardial SPECT imaging was performed after resting intravenous injection of 10 mCi Tc-6766m tetrofosmin. Subsequently, intravenous infusion of Lexiscan was performed under the supervision of the Cardiology staff. At peak effect of the drug, 30 mCi Tc-2166m tetrofosmin was injected intravenously and standard myocardial SPECT imaging was performed. Quantitative gated imaging was also  performed to evaluate left ventricular wall motion, and estimate left ventricular ejection fraction. COMPARISON:  None. FINDINGS: Perfusion: Extensive decreased myocardial activity is seen on both the stress and resting images involving the anterior, anteroseptal, and apical walls of the left ventricle, consistent with myocardial infarct. No areas of reversibility are seen to suggest the presence of inducible myocardial ischemia. Wall Motion: Moderate left ventricular dilatation. Global left ventricular hypokinesis. Left Ventricular Ejection Fraction: 6 % End diastolic volume 232 ml End systolic volume 217 ml IMPRESSION: 1. Extensive myocardial infarction involving the anterior, anteroseptal, and apical walls. No evidence of inducible ischemia. 2. Global left ventricular hypokinesis and left ventricular dilatation. 3. Left ventricular ejection fraction 6% 4. Non invasive risk stratification*: High *2012 Appropriate Use Criteria for Coronary Revascularization Focused Update: J Am Coll Cardiol. 2012;59(9):857-881. http://content.dementiazones.comonlinejacc.org/article.aspx?articleid=1201161 Electronically Signed   By: Myles RosenthalJohn  Stahl M.D.   On: 09/05/2016 14:01    Anti-infectives: Anti-infectives    Start     Dose/Rate Route Frequency Ordered Stop   09/05/16 1800  piperacillin-tazobactam (ZOSYN) IVPB 3.375 g     3.375 g 12.5 mL/hr over 240 Minutes Intravenous Every 8 hours 09/05/16 1742        Assessment/Plan: Acute cholecystitis CAD St jude valve HTN  [facility] was scheduled for Lap chole today, but heparin gtt not stopped Plan for OR Monday.  S/w RN about stopping heparin gtt at 1AM and NPO at MN for surgery Monday. Appreciate Cardiac assistance  LOS: 4 days    Marigene EhlersRamirez Jr., [facility] 09/07/2016

## 2016-09-07 NOTE — Progress Notes (Addendum)
[facility] PROGRESS NOTE  LAITYN BENSEN UJW:[id number] DOB: Dec 24, 1960 DOA: 09/03/2016   PCP: Ignatius Specking, MD     Assessment/Plan: Principal Problem:   Acute cholecystitis Active Problems:   Chronic systolic heart failure (HCC)   MITRAL VALVE REPLACEMENT, HX OF   Nausea with vomiting   Anxiety state   Tobacco abuse   Coronary artery disease involving native coronary artery of native heart without angina pectoris   Mitral valve disease, rheumatic   CAD (coronary artery disease)   55 y.o. 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 She went to [facility] where CT showed gallbladder wall thickening and edema, concerning for developing acute cholecystitis. Acalculous cholecystitis dx by [place].   Assessment/Plan Acute cholecystitis/epigastric abdominal pain: Patient presented with epigastric abdominal pain with nausea and vomiting. Imaging studies show signs of biliary sludge and acalculus cholecystitis. Imaging reports in [facility] from [facility]. Cont tele, shows normal sinus rhythm Hold IV fluids as the patient is eating - Morphine prn pain cardiac clearance  for possible laparoscopic cholecystectomy completed - INR 1.29, coumadin held, continue heparin gtt today, hold orders in place as per surgery recommendations and timing of surgery - continue IV antibiotics, antiemetics PRN, IVF  EF was initially 25%, but improved to 35-40%, last echo 2013, repeat 2-D echo shows EF of 35-40% with akinesis and extensive scarring  nuclear stress test grossly abnormal, She is at least moderate risk for surgery.  If medically indicated, proceed with surgery. Anticipated to have surgery tomorrow     Coronary artery disease-at this point no cardiac cath indicated, Known CAD with prior LM stent.  Dr Margarita Mail notes from 2013 suggested life long  plavix however she has been noncompliant with this for several years. 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 Jude MVR on chronic anticoagulation therapy with subtherapeutic INR: Patient's last INR was found to be 1.9 this morning upon reviewing records. Follow PT/INR - Heparin drip per pharmacy for bridging  , hold prior to surgery  Combined CHF: I see up on file showed EF of 35-40% with grade 1 diastolic dysfunction. - Strict ins and outs  Tobacco abuse: Patient declined nicotine patch as she reports that they make her sick. Counseled about nicotine cessation   DVT prophylaxsis heparin gtt  Code Status:  Full code      Family Communication: Discussed in detail with the patient, all imaging results, lab results explained to the patient   Disposition Plan:  Surgery tomorrow       Consultants:  CC[facility]  cardiology  Procedures:  None   Antibiotics: Anti-infectives    Start     Dose/Rate Route Frequency Ordered Stop   09/05/16 1800  piperacillin-tazobactam (ZOSYN) IVPB 3.375 g     3.375 g 12.5 mL/hr over 240 Minutes Intravenous Every 8 hours 09/05/16 1742           HPI/Subjective: No cp, sob, no n,v,ap  Objective: Vitals:   09/06/16 1009 09/06/16 1300 09/06/16 2112 09/07/16 0524  BP: (!) 152/64  Marland Kitchen)  149/81 (!) 160/62  Pulse:  69 (!) 49 (!) 57  Resp:   18 18  Temp:   97.7 F (36.5 C) 99 F (37.2 C)  TempSrc:   Oral Oral  SpO2:   99% 92%  Weight:      Height:        Intake/Output Summary (Last 24 hours) at 09/07/16 1011 Last data filed at 09/07/16 0527  Gross per 24 hour  Intake              960 ml  Output             1425 ml  Net              -465 ml    Exam:  Examination:  General exam: Appears calm and comfortable  Respiratory system: Clear to auscultation. Respiratory effort normal. Cardiovascular system: S1 & S2 heard, RRR. No JVD, murmurs, rubs, gallops or clicks. No pedal edema. Gastrointestinal system: Abdomen is nondistended, soft and nontender. No organomegaly or masses felt. Normal bowel sounds heard. Central nervous system: Alert and oriented. No focal neurological deficits. Extremities: Symmetric 5 x 5 power. Skin: No rashes, lesions or ulcers Psychiatry: Judgement and insight appear normal. Mood & affect appropriate.     Data Reviewed: I have personally reviewed following labs and imaging studies  Micro Results Recent Results (from the past 240 hour(s))  Surgical pcr screen     Status: None   Collection Time: 09/03/16 10:58 PM  Result Value Ref Range Status   MRSA, PCR NEGATIVE NEGATIVE Final   Staphylococcus aureus NEGATIVE NEGATIVE Final    Comment:        The Xpert SA Assay (FDA approved for NASAL specimens in patients over 321 years of age), is one component of a comprehensive surveillance program.  Test performance has been validated by [facility] for patients 54 year old. It is not intended to diagnose infection nor to guide or monitor treatment.   Culture, blood (routine x 2)     Status: None (Preliminary result)   Collection Time: 09/05/16  7:23 PM  Result Value Ref Range Status   Specimen Description BLOOD RIGHT ANTECUBITAL  Final   Special Requests BOTTLES DRAWN AEROBIC AND ANAEROBIC 10CC  Final   Culture NO GROWTH < 24 HOURS  Final   Report Status PENDING  Incomplete  Culture, blood (routine x 2)     Status: None (Preliminary result)   Collection Time: 09/05/16  7:30 PM  Result Value Ref Range Status   Specimen Description BLOOD RIGHT HAND  Final   Special Requests BOTTLES DRAWN AEROBIC ONLY 5CC  Final   Culture NO GROWTH < 24 HOURS  Final    Report Status PENDING  Incomplete    Radiology Reports Nm Myocar Multi W/spect W/wall Motion / Ef  Result Date: 09/05/2016 CLINICAL DATA:  Chest pain. Shortness of breath. Congestive heart failure. COPD. Previous myocardial infarct and stroke. Preop evaluation. EXAM: MYOCARDIAL IMAGING WITH SPECT (REST AND PHARMACOLOGIC-STRESS) GATED LEFT VENTRICULAR WALL MOTION STUDY LEFT VENTRICULAR EJECTION FRACTION TECHNIQUE: Standard myocardial SPECT imaging was performed after resting intravenous injection of 10 mCi Tc-6546m tetrofosmin. Subsequently, intravenous infusion of Lexiscan was performed under the supervision of the Cardiology staff. At peak effect of the drug, 30 mCi Tc-546m tetrofosmin was injected intravenously and standard myocardial SPECT imaging was performed. Quantitative gated imaging was also performed to evaluate left ventricular wall motion, and estimate left ventricular ejection fraction. COMPARISON:  None.  FINDINGS: Perfusion: Extensive decreased myocardial activity is seen on both the stress and resting images involving the anterior, anteroseptal, and apical walls of the left ventricle, consistent with myocardial infarct. No areas of reversibility are seen to suggest the presence of inducible myocardial ischemia. Wall Motion: Moderate left ventricular dilatation. Global left ventricular hypokinesis. Left Ventricular Ejection Fraction: 6 % End diastolic volume 232 ml End systolic volume 217 ml IMPRESSION: 1. Extensive myocardial infarction involving the anterior, anteroseptal, and apical walls. No evidence of inducible ischemia. 2. Global left ventricular hypokinesis and left ventricular dilatation. 3. Left ventricular ejection fraction 6% 4. Non invasive risk stratification*: High *2012 Appropriate Use Criteria for Coronary Revascularization Focused Update: J Am Coll Cardiol. 2012;59(9):857-881. http://content.dementiazones.com.aspx?articleid=1201161 Electronically Signed   By: Myles Rosenthal M.D.    On: 09/05/2016 14:01     CBC  Recent Labs Lab 09/04/16 0515 09/05/16 0509 09/06/16 0739 09/07/16 0507  WBC 7.9 9.6 8.4 9.9  HGB 12.3 11.9* 12.4 11.7*  HCT 36.9 35.2* 36.5 34.9*  PLT 221 241 256 262  MCV 96.3 95.1 95.5 95.4  MCH 32.1 32.2 32.5 32.0  MCHC 33.3 33.8 34.0 33.5  RDW 13.4 13.0 13.1 13.2    Chemistries   Recent Labs Lab 09/04/16 0515 09/05/16 0509 09/06/16 0739  NA 138 135 138  K 3.9 3.7 3.6  CL 103 101 101  CO2 28 22 30   GLUCOSE 95 71 107*  BUN <5* <5* 7  CREATININE 0.85 0.93 0.89  CALCIUM 8.2* 8.0* 8.1*  AST 14* 17 19  ALT 11* 11* 12*  ALKPHOS 58 56 57  BILITOT 0.4 0.9 1.0   ------------------------------------------------------------------------------------------------------------------ estimated creatinine clearance is 70.6 mL/min (by C-G formula based on SCr of 0.89 mg/dL). ------------------------------------------------------------------------------------------------------------------ No results for input(s): HGBA1C in the last 72 hours. ------------------------------------------------------------------------------------------------------------------ No results for input(s): CHOL, HDL, LDLCALC, TRIG, CHOLHDL, LDLDIRECT in the last 72 hours. ------------------------------------------------------------------------------------------------------------------ No results for input(s): TSH, T4TOTAL, T3FREE, THYROIDAB in the last 72 hours.  Invalid input(s): FREET3 ------------------------------------------------------------------------------------------------------------------ No results for input(s): VITAMINB12, FOLATE, FERRITIN, TIBC, IRON, RETICCTPCT in the last 72 hours.  Coagulation profile  Recent Labs Lab 09/03/16 2224 09/04/16 0515 09/05/16 0509 09/06/16 0739 09/07/16 0507  INR 1.87 1.83 1.60 1.63 1.29    No results for input(s): DDIMER in the last 72 hours.  Cardiac Enzymes No results for input(s): CKMB, TROPONINI, MYOGLOBIN in  the last 168 hours.  Invalid input(s): CK ------------------------------------------------------------------------------------------------------------------ Invalid input(s): POCBNP   CBG: No results for input(s): GLUCAP in the last 168 hours.     Studies: Nm Myocar Multi W/spect W/wall Motion / Ef  Result Date: 09/05/2016 CLINICAL DATA:  Chest pain. Shortness of breath. Congestive heart failure. COPD. Previous myocardial infarct and stroke. Preop evaluation. EXAM: MYOCARDIAL IMAGING WITH SPECT (REST AND PHARMACOLOGIC-STRESS) GATED LEFT VENTRICULAR WALL MOTION STUDY LEFT VENTRICULAR EJECTION FRACTION TECHNIQUE: Standard myocardial SPECT imaging was performed after resting intravenous injection of 10 mCi Tc-98m tetrofosmin. Subsequently, intravenous infusion of Lexiscan was performed under the supervision of the Cardiology staff. At peak effect of the drug, 30 mCi Tc-21m tetrofosmin was injected intravenously and standard myocardial SPECT imaging was performed. Quantitative gated imaging was also performed to evaluate left ventricular wall motion, and estimate left ventricular ejection fraction. COMPARISON:  None. FINDINGS: Perfusion: Extensive decreased myocardial activity is seen on both the stress and resting images involving the anterior, anteroseptal, and apical walls of the left ventricle, consistent with myocardial infarct. No areas of reversibility are seen to suggest the presence of inducible myocardial ischemia.  Wall Motion: Moderate left ventricular dilatation. Global left ventricular hypokinesis. Left Ventricular Ejection Fraction: 6 % End diastolic volume 232 ml End systolic volume 217 ml IMPRESSION: 1. Extensive myocardial infarction involving the anterior, anteroseptal, and apical walls. No evidence of inducible ischemia. 2. Global left ventricular hypokinesis and left ventricular dilatation. 3. Left ventricular ejection fraction 6% 4. Non invasive risk stratification*: High *2012  Appropriate Use Criteria for Coronary Revascularization Focused Update: J Am Coll Cardiol. 2012;59(9):857-881. http://content.dementiazones.com.aspx?articleid=1201161 Electronically Signed   By: Myles Rosenthal M.D.   On: 09/05/2016 14:01      Lab Results  Component Value Date   HGBA1C 6.4 (H) 09/24/2012   HGBA1C (H) 07/04/2010    6.1 (NOTE)                                                                       According to the ADA Clinical Practice Recommendations for 2011, when HbA1c is used as a screening test:   >=6.5%   Diagnostic of Diabetes Mellitus           (if abnormal result  is confirmed)  5.7-6.4%   Increased risk of developing Diabetes Mellitus  References:Diagnosis and Classification of Diabetes Mellitus,Diabetes Care,2011,34(Suppl 1):S62-S69 and Standards of Medical Care in         Diabetes - 2011,Diabetes Care,2011,34  (Suppl 1):S11-S61.   Lab Results  Component Value Date   LDLCALC 170 (H) 09/25/2012   CREATININE 0.89 09/06/2016       Scheduled Meds: . atorvastatin  20 mg Oral q1800  . carvedilol  6.25 mg Oral BID WC  . enalapril  5 mg Oral BID  . mouth rinse  15 mL Mouth Rinse BID  . piperacillin-tazobactam (ZOSYN)  IV  3.375 g Intravenous Q8H   Continuous Infusions: . heparin       LOS: 4 days    Time spent: >30 MINS    [facility]  [facility] Pager [phone]. If 7PM-7AM, please contact night-coverage at [email or web address], password [facility] 09/07/2016, 10:11 AM  LOS: 4 days

## 2016-09-07 NOTE — Progress Notes (Signed)
ANTICOAGULATION CONSULT NOTE - Follow Up Consult  Pharmacy Consult:  Heparin Indication:  St. Jude MVR  Allergies  Allergen Reactions  . Cyclobenzaprine Hcl Nausea And Vomiting  . Latex Other (See Comments)    Unknown    Patient Measurements: Height: 5\' 4"  (162.6 cm) Weight: 160 lb 3.2 oz (72.7 kg) IBW/kg (Calculated) : 54.7 Heparin Dosing Weight: 72 kg  Vital Signs: Temp: 99 F (37.2 C) (10/15 0524) Temp Source: Oral (10/15 0524) BP: 160/62 (10/15 0524) Pulse Rate: 57 (10/15 0524)  Labs:  Recent Labs  09/05/16 0509 09/06/16 0739 09/07/16 0507  HGB 11.9* 12.4 11.7*  HCT 35.2* 36.5 34.9*  PLT 241 256 262  LABPROT 19.2* 19.5* 16.2*  INR 1.60 1.63 1.29  HEPARINUNFRC 0.42 0.36 0.42  CREATININE 0.93 0.89  --     Estimated Creatinine Clearance: 70.6 mL/min (by C-G formula based on SCr of 0.89 mg/dL).   Assessment: 55 YOF on Coumadin PTA for history of St. Jude MVR.  She was transitioned to IV heparin bridge for cholecystectomy now planned for tomorrow.  Heparin level is therapeutic at 0.42 this morning; no bleeding reported, CBC stable.   Goal of Therapy:  Heparin level 0.3-0.7 units/ml Monitor platelets by anticoagulation protocol: Yes   Plan:  - Continue heparin gtt at 1250 units/hr - to stop at 1:00 on 10/16 - Daily heparin level and CBC - Monitor for s/sx of bleeding - Cholecystectomy planned for tomorrow - will f/u after   Loura BackJennifer [place], PharmD, BCPS Clinical Pharmacist Phone for today [phone] Main pharmacy - [phone] 09/07/2016 10:15 AM

## 2016-09-08 ENCOUNTER — Inpatient Hospital Stay (HOSPITAL_COMMUNITY): Payer: Medicaid Other | Admitting: Certified Registered"

## 2016-09-08 ENCOUNTER — Encounter (HOSPITAL_COMMUNITY)
Admission: AD | Disposition: A | Discharge status: Home / Self Care | Payer: Self-pay | Source: Other Acute Inpatient Hospital | Attending: Internal Medicine

## 2016-09-08 ENCOUNTER — Inpatient Hospital Stay (HOSPITAL_COMMUNITY): Payer: Medicaid Other

## 2016-09-08 HISTORY — PX: CHOLECYSTECTOMY: SHX55

## 2016-09-08 LAB — CBC
HEMATOCRIT: 34.3 % — AB (ref 36.0–46.0)
HEMOGLOBIN: 11.4 g/dL — AB (ref 12.0–15.0)
MCH: 32.1 pg (ref 26.0–34.0)
MCHC: 33.2 g/dL (ref 30.0–36.0)
MCV: 96.6 fL (ref 78.0–100.0)
Platelets: 255 10*3/uL (ref 150–400)
RBC: 3.55 MIL/uL — ABNORMAL LOW (ref 3.87–5.11)
RDW: 13.2 % (ref 11.5–15.5)
WBC: 7.2 10*3/uL (ref 4.0–10.5)

## 2016-09-08 SURGERY — LAPAROSCOPIC CHOLECYSTECTOMY WITH INTRAOPERATIVE CHOLANGIOGRAM
Anesthesia: General | Site: Abdomen

## 2016-09-08 MED ORDER — HEPARIN (PORCINE) IN NACL 100-0.45 UNIT/ML-% IJ SOLN
1400.0000 [IU]/h | INTRAMUSCULAR | Status: DC
  Administered 2016-09-08 – 2016-09-10 (×3): 1250 [IU]/h via INTRAVENOUS
  Administered 2016-09-11 – 2016-09-12 (×2): 1400 [IU]/h via INTRAVENOUS
  Filled 2016-09-08 (×7): qty 250

## 2016-09-08 MED ORDER — 0.9 % SODIUM CHLORIDE (POUR BTL) OPTIME
TOPICAL | Status: DC | PRN
  Administered 2016-09-08: 1000 mL

## 2016-09-08 MED ORDER — EPHEDRINE SULFATE 50 MG/ML IJ SOLN
INTRAMUSCULAR | Status: DC | PRN
  Administered 2016-09-08: 5 mg via INTRAVENOUS

## 2016-09-08 MED ORDER — NEOSTIGMINE METHYLSULFATE 10 MG/10ML IV SOLN
INTRAVENOUS | Status: DC | PRN
  Administered 2016-09-08: 4 mg via INTRAVENOUS

## 2016-09-08 MED ORDER — NEOSTIGMINE METHYLSULFATE 5 MG/5ML IV SOSY
PREFILLED_SYRINGE | INTRAVENOUS | Status: AC
  Filled 2016-09-08: qty 5

## 2016-09-08 MED ORDER — PROPOFOL 10 MG/ML IV BOLUS
INTRAVENOUS | Status: AC
  Filled 2016-09-08: qty 20

## 2016-09-08 MED ORDER — HEMOSTATIC AGENTS (NO CHARGE) OPTIME
TOPICAL | Status: DC | PRN
  Administered 2016-09-08: 1 via TOPICAL

## 2016-09-08 MED ORDER — HYDROCODONE-ACETAMINOPHEN 5-325 MG PO TABS
1.0000 | ORAL_TABLET | ORAL | Status: DC | PRN
  Administered 2016-09-08 – 2016-09-12 (×20): 2 via ORAL
  Filled 2016-09-08: qty 1
  Filled 2016-09-08 (×17): qty 2
  Filled 2016-09-08 (×2): qty 1
  Filled 2016-09-08 (×2): qty 2

## 2016-09-08 MED ORDER — MIDAZOLAM HCL 5 MG/5ML IJ SOLN
INTRAMUSCULAR | Status: DC | PRN
  Administered 2016-09-08: 2 mg via INTRAVENOUS

## 2016-09-08 MED ORDER — FENTANYL CITRATE (PF) 100 MCG/2ML IJ SOLN
INTRAMUSCULAR | Status: AC
  Filled 2016-09-08: qty 2

## 2016-09-08 MED ORDER — GLYCOPYRROLATE 0.2 MG/ML IJ SOLN
INTRAMUSCULAR | Status: DC | PRN
  Administered 2016-09-08: 0.6 mg via INTRAVENOUS

## 2016-09-08 MED ORDER — HYDROMORPHONE HCL 1 MG/ML IJ SOLN
0.2500 mg | INTRAMUSCULAR | Status: DC | PRN
  Administered 2016-09-08: 0.5 mg via INTRAVENOUS
  Administered 2016-09-08 (×2): 0.25 mg via INTRAVENOUS

## 2016-09-08 MED ORDER — MIDAZOLAM HCL 2 MG/2ML IJ SOLN
INTRAMUSCULAR | Status: AC
  Filled 2016-09-08: qty 2

## 2016-09-08 MED ORDER — PROPOFOL 10 MG/ML IV BOLUS
INTRAVENOUS | Status: DC | PRN
  Administered 2016-09-08: 100 mg via INTRAVENOUS

## 2016-09-08 MED ORDER — LACTATED RINGERS IV SOLN
INTRAVENOUS | Status: DC | PRN
  Administered 2016-09-08 (×2): via INTRAVENOUS

## 2016-09-08 MED ORDER — BUPIVACAINE-EPINEPHRINE (PF) 0.25% -1:200000 IJ SOLN
INTRAMUSCULAR | Status: AC
  Filled 2016-09-08: qty 30

## 2016-09-08 MED ORDER — HEPARIN (PORCINE) IN NACL 100-0.45 UNIT/ML-% IJ SOLN
1250.0000 [IU]/h | INTRAMUSCULAR | Status: DC
  Filled 2016-09-08 (×2): qty 250

## 2016-09-08 MED ORDER — LACTATED RINGERS IV SOLN
INTRAVENOUS | Status: DC
  Administered 2016-09-08 – 2016-09-12 (×2): via INTRAVENOUS

## 2016-09-08 MED ORDER — HYDROMORPHONE HCL 1 MG/ML IJ SOLN
INTRAMUSCULAR | Status: AC
  Filled 2016-09-08: qty 1

## 2016-09-08 MED ORDER — ROCURONIUM BROMIDE 100 MG/10ML IV SOLN
INTRAVENOUS | Status: DC | PRN
  Administered 2016-09-08: 50 mg via INTRAVENOUS

## 2016-09-08 MED ORDER — BUPIVACAINE-EPINEPHRINE 0.25% -1:200000 IJ SOLN
INTRAMUSCULAR | Status: DC | PRN
  Administered 2016-09-08: 30 mL

## 2016-09-08 MED ORDER — GLYCOPYRROLATE 0.2 MG/ML IV SOSY
PREFILLED_SYRINGE | INTRAVENOUS | Status: AC
  Filled 2016-09-08: qty 3

## 2016-09-08 MED ORDER — ONDANSETRON HCL 4 MG/2ML IJ SOLN
INTRAMUSCULAR | Status: DC | PRN
  Administered 2016-09-08: 4 mg via INTRAVENOUS

## 2016-09-08 MED ORDER — SODIUM CHLORIDE 0.9 % IR SOLN
Status: DC | PRN
  Administered 2016-09-08: 1000 mL

## 2016-09-08 MED ORDER — FENTANYL CITRATE (PF) 100 MCG/2ML IJ SOLN
INTRAMUSCULAR | Status: DC | PRN
  Administered 2016-09-08 (×3): 50 ug via INTRAVENOUS
  Administered 2016-09-08: 100 ug via INTRAVENOUS
  Administered 2016-09-08: 50 ug via INTRAVENOUS

## 2016-09-08 MED ORDER — HYDROMORPHONE HCL 1 MG/ML IJ SOLN
INTRAMUSCULAR | Status: AC
  Administered 2016-09-08: 0.25 mg via INTRAVENOUS
  Filled 2016-09-08: qty 1

## 2016-09-08 MED ORDER — WHITE PETROLATUM GEL
Status: AC
  Administered 2016-09-08: 03:00:00
  Filled 2016-09-08: qty 1

## 2016-09-08 MED ORDER — SODIUM CHLORIDE 0.9 % IV SOLN
INTRAVENOUS | Status: DC | PRN
  Administered 2016-09-08: 10 mL

## 2016-09-08 MED ORDER — IOPAMIDOL (ISOVUE-300) INJECTION 61%
INTRAVENOUS | Status: AC
  Filled 2016-09-08: qty 50

## 2016-09-08 MED ORDER — LIDOCAINE HCL (CARDIAC) 20 MG/ML IV SOLN
INTRAVENOUS | Status: DC | PRN
  Administered 2016-09-08: 60 mg via INTRAVENOUS

## 2016-09-08 SURGICAL SUPPLY — 45 items
APPLIER CLIP 5 13 M/L LIGAMAX5 (MISCELLANEOUS)
APPLIER CLIP ROT 10 11.4 M/L (STAPLE)
APR CLP MED LRG 11.4X10 (STAPLE)
APR CLP MED LRG 5 ANG JAW (MISCELLANEOUS)
BAG SPEC RTRVL LRG 6X4 10 (ENDOMECHANICALS) ×1
BLADE SURG ROTATE 9660 (MISCELLANEOUS) IMPLANT
CANISTER SUCTION 2500CC (MISCELLANEOUS) ×2 IMPLANT
CHLORAPREP W/TINT 26ML (MISCELLANEOUS) ×2 IMPLANT
CHOLANGIOGRAM CATH TAUT (CATHETERS) ×2 IMPLANT
CLIP APPLIE 5 13 M/L LIGAMAX5 (MISCELLANEOUS) IMPLANT
CLIP APPLIE ROT 10 11.4 M/L (STAPLE) IMPLANT
COVER MAYO STAND STRL (DRAPES) ×2 IMPLANT
COVER SURGICAL LIGHT HANDLE (MISCELLANEOUS) ×2 IMPLANT
DRAPE C-ARM 42X72 X-RAY (DRAPES) ×2 IMPLANT
ELECT REM PT RETURN 9FT ADLT (ELECTROSURGICAL) ×2
ELECTRODE REM PT RTRN 9FT ADLT (ELECTROSURGICAL) ×1 IMPLANT
FILTER SMOKE EVAC LAPAROSHD (FILTER) ×2 IMPLANT
GLOVE SURG SIGNA 7.5 PF LTX (GLOVE) ×1 IMPLANT
GLOVE SURG SS PI 7.5 STRL IVOR (GLOVE) ×2 IMPLANT
GOWN STRL REUS W/ TWL LRG LVL3 (GOWN DISPOSABLE) ×2 IMPLANT
GOWN STRL REUS W/ TWL XL LVL3 (GOWN DISPOSABLE) ×1 IMPLANT
GOWN STRL REUS W/TWL LRG LVL3 (GOWN DISPOSABLE) ×4
GOWN STRL REUS W/TWL XL LVL3 (GOWN DISPOSABLE) ×2
HEMOSTAT SURGICEL 2X14 (HEMOSTASIS) ×1 IMPLANT
IV CATH 14GX2 1/4 (CATHETERS) ×2 IMPLANT
KIT BASIN OR (CUSTOM PROCEDURE TRAY) ×2 IMPLANT
KIT ROOM TURNOVER OR (KITS) ×2 IMPLANT
LIQUID BAND (GAUZE/BANDAGES/DRESSINGS) ×2 IMPLANT
NS IRRIG 1000ML POUR BTL (IV SOLUTION) ×2 IMPLANT
PAD ARMBOARD 7.5X6 YLW CONV (MISCELLANEOUS) ×2 IMPLANT
POUCH SPECIMEN RETRIEVAL 10MM (ENDOMECHANICALS) ×2 IMPLANT
SCISSORS LAP 5X35 DISP (ENDOMECHANICALS) ×2 IMPLANT
SET IRRIG TUBING LAPAROSCOPIC (IRRIGATION / IRRIGATOR) ×2 IMPLANT
SLEEVE ENDOPATH XCEL 5M (ENDOMECHANICALS) ×2 IMPLANT
SPECIMEN JAR SMALL (MISCELLANEOUS) ×2 IMPLANT
STOPCOCK 4 WAY LG BORE MALE ST (IV SETS) ×2 IMPLANT
SUT MNCRL AB 4-0 PS2 18 (SUTURE) ×3 IMPLANT
TOWEL OR 17X24 6PK STRL BLUE (TOWEL DISPOSABLE) ×2 IMPLANT
TOWEL OR 17X26 10 PK STRL BLUE (TOWEL DISPOSABLE) ×2 IMPLANT
TRAY LAPAROSCOPIC MC (CUSTOM PROCEDURE TRAY) ×2 IMPLANT
TROCAR XCEL BLUNT TIP 100MML (ENDOMECHANICALS) ×2 IMPLANT
TROCAR XCEL NON-BLD 11X100MML (ENDOMECHANICALS) IMPLANT
TROCAR XCEL NON-BLD 5MMX100MML (ENDOMECHANICALS) ×2 IMPLANT
TUBING EXTENTION W/L.L. (IV SETS) ×2 IMPLANT
TUBING INSUFFLATION (TUBING) ×2 IMPLANT

## 2016-09-08 NOTE — Anesthesia Procedure Notes (Signed)
Procedure Name: Intubation Date/Time: 09/08/2016 10:35 AM Performed by: Lanell MatarBAKER, Nayib Remer M Pre-anesthesia Checklist: Patient identified, Emergency Drugs available, Suction available and Patient being monitored Patient Re-evaluated:Patient Re-evaluated prior to inductionOxygen Delivery Method: Circle System Utilized Preoxygenation: Pre-oxygenation with 100% oxygen Intubation Type: IV induction Ventilation: Mask ventilation without difficulty and Oral airway inserted - appropriate to patient size Laryngoscope Size: Hyacinth MeekerMiller and 2 Grade View: Grade I Tube type: Oral Tube size: 7.0 mm Number of attempts: 1 Airway Equipment and Method: Stylet and Oral airway Placement Confirmation: ETT inserted through vocal cords under direct vision,  positive ETCO2 and breath sounds checked- equal and bilateral Secured at: 22 cm Tube secured with: Tape Dental Injury: Teeth and Oropharynx as per pre-operative assessment

## 2016-09-08 NOTE — Op Note (Addendum)
09/03/2016 - 09/08/2016  12:31 PM  PATIENT:  [name], 55 y.o., female, MRN: [id number]  PREOP DIAGNOSIS:  acute cholecystitis  POSTOP DIAGNOSIS:   acute cholecystitis, gall bladder folded on itself  PROCEDURE:   Procedure(s): LAPAROSCOPIC CHOLECYSTECTOMY WITH INTRAOPERATIVE CHOLANGIOGRAM  SURGEON:   Ovidio Kin, M.D.  ASSISTANT:   None  ANESTHESIA:   general  Anesthesiologist: Cecile Hearing, MD; Gaynelle Adu, MD CRNA: Marni Griffon, CRNA; Lanell Matar, CRNA  General  ASA: 3  EBL:  minimal  ml  BLOOD ADMINISTERED: none  DRAINS: none   LOCAL MEDICATIONS USED:   30 cc 1/4% marcaine  SPECIMEN:   Gall bladder  COUNTS CORRECT:  YES  INDICATIONS FOR PROCEDURE:  [name] is a 56 y.o. (DOB: 11/30/1960) white female whose primary care physician is Ignatius Specking, MD and comes for cholecystectomy.   She has a St. Jude's mitral valve and was on Coumadin.  Her coumadin has been held, she was transitioned to heparin, and the heparin has been off since 0100.   The indications and risks of the gall bladder surgery were explained to the patient.  The risks include, but are not limited to, infection, bleeding, common bile duct injury and open surgery.  SURGERY:  The patient was taken to OR room #1 at [facility]  The abdomen was prepped with chloroprep.  The patient was on Zosyn prior at the beginning of the operation.   A time out was held and the surgical checklist run.   An infraumbilical incision was made into the abdominal cavity.  A 12 mm Hasson trocar was inserted into the abdominal cavity through the infraumbilical incision and secured with a 0 Vicryl suture.  Three additional trocars were inserted: a 10 mm trocar in the sub-xiphoid location, a 5 mm trocar in the right mid subcostal area, and a 5 mm trocar in the right lateral subcostal area.   The abdomen was explored and the liver, stomach, and bowel that could be seen were unremarkable.  She  had adhesions of her right colon to the anterior peritoneal surface.  I spent 10 minutes taking down these adhesions.   The gall bladder was edematous and was about 1/2 encased in omental adhesions.  The duodenum was also stuck up to it.   I grasped the gall bladder and rotated it cephalad.  Disssection was carried down to the gall bladder/cystic duct junction and the cystic duct isolated.  A clip was placed on the gall bladder side of the cystic duct.   An intra-operative cholangiogram was shot.   The intra-operative cholangiogram was shot using a cut off Taut catheter placed through a 14 gauge angiocath in the RUQ.  The Taut catheter was inserted in the cut cystic duct and secured with an endoclip.  A cholangiogram was shot with 10 cc of 1/2 strength Isoview.  Using fluoroscopy, the cholangiogram showed the flow of contrast into the common bile duct, up the hepatic radicals, and into the duodenum.  There was no mass or obstruction.  This was a normal intra-operative cholangiogram.   The Taut catheter was removed.  The cystic duct was tripley endoclipped and the cystic artery was identified and clipped.  The gall bladder was bluntly and sharpley dissected from the gall bladder bed.   After the gall bladder was removed from the liver, the gall bladder bed and Tri[place] of [place] were inspected.  There was no bleeding or bile leak.  Because we will resume  the heparin shortly, I placed Surgicel in the bed of the gall bladder.  The gall bladder was placed in a endocatch bag and delivered through the umbilicus.  The abdomen was irrigated with 2,000 cc saline.   The trocars were then removed.  I infiltrated 30cc of 1/4% Marcaine into the incisions.  The umbilical port closed with a 0 Vicryl suture and the skin closed with 4-0 Monocryl.  The skin was painted with DermaBond.  The patient's sponge and needle count were correct.  The patient was transported to the RR in good condition.   Will plan to resume the  Heparin at 2200.  I spoke to Rosey Batheresa in the pharmacy about restarting the Heparin.  Ovidio Kinavid Ellionna Buckbee, MD, [facility] Pager: [phone] Office phone:  [phone]

## 2016-09-08 NOTE — Anesthesia Preprocedure Evaluation (Addendum)
Anesthesia Evaluation  Patient identified by MRN, date of birth, ID band Patient awake    Reviewed: Allergy & Precautions, H&P , NPO status , Patient's Chart, lab work & pertinent test results, reviewed documented beta blocker date and time   Airway Mallampati: II  TM Distance: >3 FB Neck ROM: Full    Dental no notable dental hx. (+) Edentulous Upper, Edentulous Lower, Dental Advisory Given   Pulmonary asthma , COPD,  COPD inhaler, Current Smoker,    Pulmonary exam normal breath sounds clear to auscultation       Cardiovascular hypertension, Pt. on medications and Pt. on home beta blockers + CAD, + Past MI, + Cardiac Stents, + Peripheral Vascular Disease and +CHF  + Valvular Problems/Murmurs  Rhythm:Regular Rate:Normal     Neuro/Psych  Headaches, Anxiety negative psych ROS   GI/Hepatic negative GI ROS, Neg liver ROS,   Endo/Other  negative endocrine ROS  Renal/GU negative Renal ROS  negative genitourinary   Musculoskeletal  (+) Arthritis , Osteoarthritis,  Fibromyalgia -  Abdominal   Peds  Hematology negative hematology ROS (+)   Anesthesia Other Findings   Reproductive/Obstetrics negative OB ROS                            Anesthesia Physical Anesthesia Plan  ASA: III  Anesthesia Plan: General   Post-op Pain Management:    Induction: Intravenous  Airway Management Planned: Oral ETT  Additional Equipment:   Intra-op Plan:   Post-operative Plan: Extubation in OR  Informed Consent: I have reviewed the patients History and Physical, chart, labs and discussed the procedure including the risks, benefits and alternatives for the proposed anesthesia with the patient or authorized representative who has indicated his/her understanding and acceptance.   Dental advisory given  Plan Discussed with: CRNA  Anesthesia Plan Comments:         Anesthesia Quick Evaluation

## 2016-09-08 NOTE — Transfer of Care (Signed)
Immediate Anesthesia Transfer of Care Note  Patient: [name]  Procedure(s) Performed: Procedure(s): LAPAROSCOPIC CHOLECYSTECTOMY WITH INTRAOPERATIVE CHOLANGIOGRAM (N/A)  Patient Location: PACU  Anesthesia Type:General  Level of Consciousness: awake, alert  and oriented  Airway & Oxygen Therapy: Patient Spontanous Breathing and Patient connected to nasal cannula oxygen  Post-op Assessment: Report given to RN, Post -op Vital signs reviewed and stable and Patient moving all extremities X 4  Post vital signs: Reviewed and stable  Last Vitals:  Vitals:   09/08/16 0547 09/08/16 1242  BP: (!) 125/47   Pulse: (!) 51   Resp: 18   Temp: 36.9 C 36.9 C    Last Pain:  Vitals:   09/08/16 1242  TempSrc:   PainSc: 6       Patients Stated Pain Goal: 2 (09/07/16 1934)  Complications: No apparent anesthesia complications

## 2016-09-08 NOTE — Progress Notes (Signed)
[facility] PROGRESS NOTE  [name] ZOX:[id number] DOB: 09/22/1961 DOA: 09/03/2016   PCP: Ignatius Speckinghruv B Vyas, MD     Assessment/Plan: Principal Problem:   Acute cholecystitis Active Problems:   Chronic systolic heart failure (HCC)   MITRAL VALVE REPLACEMENT, HX OF   Nausea with vomiting   Anxiety state   Tobacco abuse   Coronary artery disease involving native coronary artery of native heart without angina pectoris   Mitral valve disease, rheumatic   CAD (coronary artery disease)   55 y.o. 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 [facility] showed gallbladder wall thickening and edema, concerning for developing acute cholecystitis. Acalculous cholecystitis dx by US.   Assessment/Plan Acute cholecystitis  Imaging studies show signs of biliary sludge and acalculus cholecystitis. [facility] Cont tele, shows normal sinus rhythm - Morphine prn pain cardiac clearance  for possible laparoscopic cholecystectomy completed - INR 1.29, coumadin held, heparin gtt held,  - continue IV antibiotics, antiemetics PRN, IVF  EF was initially 25%, but improved to 35-40%, last echo 2013, repeat 2-D echo shows EF of 35-40% with akinesis and extensive scarring  nuclear stress test grossly abnormal, She is at least moderate risk for surgery.  If medically indicated, proceed with surgery. Anticipated to have surgery today  restart heparin gtt when ok  With surgery    Coronary artery disease-at this point no cardiac cath indicated, Known CAD with prior LM stent.  Dr Margarita MaileGent's notes from 2013 suggested life long plavix however she has been noncompliant with this for several years. Patient has not been taking Plavix Cardiology started patient on Coreg Closely monitor  postoperatively   Chronic systolic heart failure/ischemic cardiomyopathy-EF 35-40%, without exacerbation Started on Coreg, continue enalapril Euvolemic  Nausea and vomiting - Zofran prn  Anxiety  - Change Xanax po to Ativan IV prn   Rheumatic heart disease  S/p St. Jude MVR on chronic anticoagulation therapy with subtherapeutic INR: Patient's last INR was found to be 1.9 this morning upon reviewing records. Follow PT/INR - Heparin drip per pharmacy  ,  Combined CHF: I see up on file showed EF of 35-40% with grade 1 diastolic dysfunction. - Strict ins and outs  Tobacco abuse: Patient declined nicotine patch as she reports that they make her sick. Counseled about nicotine cessation   DVT prophylaxsis heparin gtt  Code Status:  Full code      Family Communication: Discussed in detail with the patient, all imaging results, lab results explained to the patient   Disposition Plan:  Surgery tomorrow       Consultants:  [facility]  cardiology  Procedures:  None   Antibiotics: Anti-infectives    Start     Dose/Rate Route Frequency Ordered Stop   09/05/16 1800  [MAR Hold]  piperacillin-tazobactam (ZOSYN) IVPB 3.375 g     (MAR Hold since 09/08/16 0949)   3.375 g 12.5 mL/hr over 240 Minutes Intravenous Every 8 hours 09/05/16 1742           HPI/Subjective:   No symptoms.  Feels okay.  But had bad attack about 10 days ago.  Discussed surgery and options  Objective: Vitals:   09/07/16 0524 09/07/16 1306 09/07/16 2026 09/08/16 0547  BP: (!) 160/62 (!) 136/46 (!) 150/53 Marland Kitchen(!)  125/47  Pulse: (!) 57 (!) 55 (!) 51 (!) 51  Resp: 18 18 18 18   Temp: 99 F (37.2 C) 98 F (36.7 C) 97.9 F (36.6 C) 98.4 F (36.9 C)  TempSrc: Oral Oral Oral Oral  SpO2: 92% 100% 96% 91%  Weight:      Height:        Intake/Output Summary (Last 24 hours) at 09/08/16 1217 Last data filed at 09/08/16 1205  Gross per 24 hour  Intake             1845 ml  Output             1400 ml  Net               445 ml    Exam:  Examination:  General exam: Appears calm and comfortable  Respiratory system: Clear to auscultation. Respiratory effort normal. Cardiovascular system: S1 & S2 heard, RRR. No JVD, murmurs, rubs, gallops or clicks. No pedal edema. Gastrointestinal system: Abdomen is nondistended, soft and nontender. No organomegaly or masses felt. Normal bowel sounds heard. Central nervous system: Alert and oriented. No focal neurological deficits. Extremities: Symmetric 5 x 5 power. Skin: No rashes, lesions or ulcers Psychiatry: Judgement and insight appear normal. Mood & affect appropriate.     Data Reviewed: I have personally reviewed following labs and imaging studies  Micro Results Recent Results (from the past 240 hour(s))  Surgical pcr screen     Status: None   Collection Time: 09/03/16 10:58 PM  Result Value Ref Range Status   MRSA, PCR NEGATIVE NEGATIVE Final   Staphylococcus aureus NEGATIVE NEGATIVE Final    Comment:        The Xpert SA Assay (FDA approved for NASAL specimens in patients over 7 years of age), is one component of a comprehensive surveillance program.  Test performance has been validated by [facility] for patients greater than or equal to 58 year old. It is not intended to diagnose infection nor to guide or monitor treatment.   Culture, blood (routine x 2)     Status: None (Preliminary result)   Collection Time: 09/05/16  7:23 PM  Result Value Ref Range Status   Specimen Description BLOOD RIGHT ANTECUBITAL  Final   Special Requests BOTTLES DRAWN AEROBIC AND ANAEROBIC 10CC  Final   Culture NO GROWTH 2 DAYS  Final   Report Status PENDING  Incomplete  Culture, blood (routine x 2)     Status: None (Preliminary result)   Collection Time: 09/05/16  7:30 PM  Result Value Ref Range Status   Specimen Description BLOOD RIGHT HAND  Final   Special Requests BOTTLES DRAWN AEROBIC ONLY 5CC  Final   Culture NO GROWTH 2 DAYS  Final   Report  Status PENDING  Incomplete    Radiology Reports Dg Cholangiogram Operative  Result Date: 09/08/2016 CLINICAL DATA:  Intraoperative cholangiogram. EXAM: INTRAOPERATIVE CHOLANGIOGRAM TECHNIQUE: Cholangiographic images from the C-arm fluoroscopic device were submitted for interpretation post-operatively. Please see the procedural report for the amount of contrast and the fluoroscopy time utilized. COMPARISON:  09/03/2016 FINDINGS: Common hepatic duct and common bile duct were opacified with contrast. Contrast drains into the duodenum. No obstructive filling defects or stones. IMPRESSION: Patent biliary system.  Normal intraoperative cholangiogram. Electronically Signed   By: Richarda Overlie M.D.   On: 09/08/2016 12:14   Nm Myocar Multi W/spect W/wall Motion / Ef  Result Date: 09/05/2016 CLINICAL DATA:  Chest pain. Shortness of breath. Congestive heart failure.  COPD. Previous myocardial infarct and stroke. Preop evaluation. EXAM: MYOCARDIAL IMAGING WITH SPECT (REST AND PHARMACOLOGIC-STRESS) GATED LEFT VENTRICULAR WALL MOTION STUDY LEFT VENTRICULAR EJECTION FRACTION TECHNIQUE: Standard myocardial SPECT imaging was performed after resting intravenous injection of 10 mCi Tc-87m tetrofosmin. Subsequently, intravenous infusion of Lexiscan was performed under the supervision of the Cardiology staff. At peak effect of the drug, 30 mCi Tc-60m tetrofosmin was injected intravenously and standard myocardial SPECT imaging was performed. Quantitative gated imaging was also performed to evaluate left ventricular wall motion, and estimate left ventricular ejection fraction. COMPARISON:  None. FINDINGS: Perfusion: Extensive decreased myocardial activity is seen on both the stress and resting images involving the anterior, anteroseptal, and apical walls of the left ventricle, consistent with myocardial infarct. No areas of reversibility are seen to suggest the presence of inducible myocardial ischemia. Wall Motion: Moderate left  ventricular dilatation. Global left ventricular hypokinesis. Left Ventricular Ejection Fraction: 6 % End diastolic volume 232 ml End systolic volume 217 ml IMPRESSION: 1. Extensive myocardial infarction involving the anterior, anteroseptal, and apical walls. No evidence of inducible ischemia. 2. Global left ventricular hypokinesis and left ventricular dilatation. 3. Left ventricular ejection fraction 6% 4. Non invasive risk stratification*: High *2012 Appropriate Use Criteria for Coronary Revascularization Focused Update: J Am Coll Cardiol. 2012;59(9):857-881. http://content.dementiazones.com.aspx?articleid=[email or web address] Electronically Signed   By: Myles Rosenthal M.D.   On: 09/05/2016 14:01     CBC  Recent Labs Lab 09/04/16 0515 09/05/16 0509 09/06/16 0739 09/07/16 0507 09/08/16 0409  WBC 7.9 9.6 8.4 9.9 7.2  HGB 12.3 11.9* 12.4 11.7* 11.4*  HCT 36.9 35.2* 36.5 34.9* 34.3*  PLT 221 241 256 262 255  MCV 96.3 95.1 95.5 95.4 96.6  MCH 32.1 32.2 32.5 32.0 32.1  MCHC 33.3 33.8 34.0 33.5 33.2  RDW 13.4 13.0 13.1 13.2 13.2    Chemistries   Recent Labs Lab 09/04/16 0515 09/05/16 0509 09/06/16 0739  NA 138 135 138  K 3.9 3.7 3.6  CL 103 101 101  CO2 28 22 30   GLUCOSE 95 71 107*  BUN <5* <5* 7  CREATININE 0.85 0.93 0.89  CALCIUM 8.2* 8.0* 8.1*  AST 14* 17 19  ALT 11* 11* 12*  ALKPHOS 58 56 57  BILITOT 0.4 0.9 1.0   ------------------------------------------------------------------------------------------------------------------ estimated creatinine clearance is 70.6 mL/min (by C-G formula based on SCr of 0.89 mg/dL). ------------------------------------------------------------------------------------------------------------------ No results for input(s): HGBA1C in the last 72 hours. ------------------------------------------------------------------------------------------------------------------ No results for input(s): CHOL, HDL, LDLCALC, TRIG, CHOLHDL, LDLDIRECT in the last 72  hours. ------------------------------------------------------------------------------------------------------------------ No results for input(s): TSH, T4TOTAL, T3FREE, THYROIDAB in the last 72 hours.  Invalid input(s): FREET3 ------------------------------------------------------------------------------------------------------------------ No results for input(s): VITAMINB12, FOLATE, FERRITIN, TIBC, IRON, RETICCTPCT in the last 72 hours.  Coagulation profile  Recent Labs Lab 09/03/16 2224 09/04/16 0515 09/05/16 0509 09/06/16 0739 09/07/16 0507  INR 1.87 1.83 1.60 1.63 1.29    No results for input(s): DDIMER in the last 72 hours.  Cardiac Enzymes No results for input(s): CKMB, TROPONINI, MYOGLOBIN in the last 168 hours.  Invalid input(s): CK ------------------------------------------------------------------------------------------------------------------ Invalid input(s): POCBNP   CBG: No results for input(s): GLUCAP in the last 168 hours.     Studies: Dg Cholangiogram Operative  Result Date: 09/08/2016 CLINICAL DATA:  Intraoperative cholangiogram. EXAM: INTRAOPERATIVE CHOLANGIOGRAM TECHNIQUE: Cholangiographic images from the C-arm fluoroscopic device were submitted for interpretation post-operatively. Please see the procedural report for the amount of contrast and the fluoroscopy time utilized. COMPARISON:  09/03/2016 FINDINGS: Common hepatic duct and common bile duct were opacified with  contrast. Contrast drains into the duodenum. No obstructive filling defects or stones. IMPRESSION: Patent biliary system.  Normal intraoperative cholangiogram. Electronically Signed   By: Richarda Overlie M.D.   On: 09/08/2016 12:14      Lab Results  Component Value Date   HGBA1C 6.4 (H) 09/24/2012   HGBA1C (H) 07/04/2010    6.1 (NOTE)                                                                       According to the ADA Clinical Practice Recommendations for 2011, when HbA1c is used as  a screening test:   >=6.5%   Diagnostic of Diabetes Mellitus           (if abnormal result  is confirmed)  5.7-6.4%   Increased risk of developing Diabetes Mellitus  References:Diagnosis and Classification of Diabetes Mellitus,Diabetes Care,2011,34(Suppl 1):S62-S69 and Standards of Medical Care in         Diabetes - 2011,Diabetes Care,2011,34  (Suppl 1):S11-S61.   Lab Results  Component Value Date   LDLCALC 170 (H) 09/25/2012   CREATININE 0.89 09/06/2016       Scheduled Meds: . [MAR Hold] atorvastatin  20 mg Oral q1800  . [MAR Hold] carvedilol  6.25 mg Oral BID WC  . [MAR Hold] enalapril  5 mg Oral BID  . [MAR Hold] mouth rinse  15 mL Mouth Rinse BID  . [MAR Hold] piperacillin-tazobactam (ZOSYN)  IV  3.375 g Intravenous Q8H   Continuous Infusions: . lactated ringers 10 mL/hr at 09/08/16 1019     LOS: 5 days    Time spent: >30 MINS    [facility]  [facility] Pager [phone]. If 7PM-7AM, please contact night-coverage at [email or web address], password [facility] 09/08/2016, 12:17 PM  LOS: 5 days

## 2016-09-08 NOTE — Progress Notes (Signed)
[facility] Office:  [phone] General Surgery Progress Note   LOS: 5 days  POD -  2 Days Post-Op  Assessment/Plan: 1. Probable cholecystitis  On Zosyn  I discussed with the patient the indications and risks of gall bladder surgery.  The primary risks of gall bladder surgery include, but are not limited to, bleeding, infection, common bile duct injury, and open surgery.  There is also the risk that the patient may have continued symptoms after surgery.  We discussed the typical post-operative recovery course. I tried to answer the patient's questions.  The 2 big risks are her underlying cardiac disease and her anticoagulation for her heart valve.   I spoke with daughter, Laney PastorKyra Ledee, by phone about surgery.  Plan surgery this AM  2.  Chronic systolic heart failure (HCC) 3.  MITRAL VALVE REPLACEMENT, HX OF    Mitral valve disease, rheumatic  Heparin has been held since 0100 4.    Coronary artery disease involving native coronary artery of native heart without angina pectoris  5. . Anxiety state  6. Tobacco abuse   Principal Problem:   Acute cholecystitis Active Problems:   Chronic systolic heart failure (HCC)   MITRAL VALVE REPLACEMENT, HX OF   Nausea with vomiting   Anxiety state   Tobacco abuse   Coronary artery disease involving native coronary artery of native heart without angina pectoris   Mitral valve disease, rheumatic   CAD (coronary artery disease)   Subjective:  No symptoms.  Feels okay.  But had bad attack about 10 days ago.  Discussed surgery and options.  Objective:   Vitals:   09/07/16 2026 09/08/16 0547  BP: (!) 150/53 (!) 125/47  Pulse: (!) 51 (!) 51  Resp: 18 18  Temp: 97.9 F (36.6 C) 98.4 F (36.9 C)     Intake/Output from previous day:  10/15 0701 - 10/16 0700 In: 795 [P.O.:370; I.V.:125; IV Piggyback:300] Out: 1400 [Urine:1400]  Intake/Output this shift:  No intake/output data recorded.   Physical Exam:   General: Obese  WF who is alert and oriented.    HEENT: Normal. Pupils equal. .   Lungs: Clear   Abdomen: Soft.   Lab Results:    Recent Labs  09/07/16 0507 09/08/16 0409  WBC 9.9 7.2  HGB 11.7* 11.4*  HCT 34.9* 34.3*  PLT 262 255    BMET   Recent Labs  09/06/16 0739  NA 138  K 3.6  CL 101  CO2 30  GLUCOSE 107*  BUN 7  CREATININE 0.89  CALCIUM 8.1*    PT/INR   Recent Labs  09/06/16 0739 09/07/16 0507  LABPROT 19.5* 16.2*  INR 1.63 1.29    ABG  No results for input(s): PHART, HCO3 in the last 72 hours.  Invalid input(s): PCO2, PO2   Studies/Results:  No results found.   Anti-infectives:   Anti-infectives    Start     Dose/Rate Route Frequency Ordered Stop   09/05/16 1800  piperacillin-tazobactam (ZOSYN) IVPB 3.375 g     3.375 g 12.5 mL/hr over 240 Minutes Intravenous Every 8 hours 09/05/16 1742        Ovidio Kinavid Raven Furnas, MD, FACS Pager: [phone] [facility] Office: [phone] 09/08/2016

## 2016-09-09 ENCOUNTER — Encounter (HOSPITAL_COMMUNITY): Payer: Self-pay | Admitting: Surgery

## 2016-09-09 LAB — CBC
HCT: 33.8 % — ABNORMAL LOW (ref 36.0–46.0)
Hemoglobin: 11.3 g/dL — ABNORMAL LOW (ref 12.0–15.0)
MCH: 32.4 pg (ref 26.0–34.0)
MCHC: 33.4 g/dL (ref 30.0–36.0)
MCV: 96.8 fL (ref 78.0–100.0)
PLATELETS: 239 10*3/uL (ref 150–400)
RBC: 3.49 MIL/uL — AB (ref 3.87–5.11)
RDW: 13.5 % (ref 11.5–15.5)
WBC: 8.9 10*3/uL (ref 4.0–10.5)

## 2016-09-09 LAB — HEPARIN LEVEL (UNFRACTIONATED): HEPARIN UNFRACTIONATED: 0.44 [IU]/mL (ref 0.30–0.70)

## 2016-09-09 MED ORDER — WARFARIN - PHARMACIST DOSING INPATIENT
Freq: Every day | Status: DC
  Administered 2016-09-09 – 2016-09-11 (×2)

## 2016-09-09 MED ORDER — WARFARIN SODIUM 5 MG PO TABS
12.5000 mg | ORAL_TABLET | Freq: Once | ORAL | Status: AC
  Administered 2016-09-09: 12.5 mg via ORAL
  Filled 2016-09-09: qty 3

## 2016-09-09 NOTE — Discharge Instructions (Signed)

## 2016-09-09 NOTE — Anesthesia Postprocedure Evaluation (Signed)
Anesthesia Post Note  Patient: [name]  Procedure(s) Performed: Procedure(s) (LRB): LAPAROSCOPIC CHOLECYSTECTOMY WITH INTRAOPERATIVE CHOLANGIOGRAM (N/A)  Patient location during evaluation: PACU Anesthesia Type: General Level of consciousness: awake and alert Pain management: pain level controlled Vital Signs Assessment: post-procedure vital signs reviewed and stable Respiratory status: spontaneous breathing, nonlabored ventilation, respiratory function stable and patient connected to nasal cannula oxygen Cardiovascular status: blood pressure returned to baseline and stable Postop Assessment: no signs of nausea or vomiting Anesthetic complications: no     Last Vitals:  Vitals:   09/09/16 0234 09/09/16 0514  BP: (!) 165/63 (!) 156/67  Pulse: (!) 52 (!) 58  Resp: 17 17  Temp: 36.4 C 36.5 C    Last Pain:  Vitals:   09/09/16 0551  TempSrc:   PainSc: Asleep   Pain Goal: Patients Stated Pain Goal: 2 (09/09/16 0209)               Cecile HearingStephen Edward Kippy Melena

## 2016-09-09 NOTE — Progress Notes (Signed)
Patient ID: [name], female   DOB: 25-May-1961, 55 y.o.   MRN: [id number]  [facility] Surgery Progress Note  1 Day Post-Op  Subjective: Feeling better than prior to surgery. Tolerated dinner last night and feeling hungry this morning. No flatus or BM.  Objective: Vital signs in last 24 hours: Temp:  [97.6 F (36.4 C)-98.5 F (36.9 C)] 97.7 F (36.5 C) (10/17 0514) Pulse Rate:  [52-68] 58 (10/17 0514) Resp:  [12-28] 17 (10/17 0514) BP: (134-180)/(58-77) 156/67 (10/17 0514) SpO2:  [88 %-97 %] 95 % (10/17 0514) Last BM Date: 09/06/16  Intake/Output from previous day: 10/16 0701 - 10/17 0700 In: 1948.5 [P.O.:480; I.V.:1318.5; IV Piggyback:150] Out: 1975 [Urine:1950; Blood:25] Intake/Output this shift: No intake/output data recorded.  PE: Gen:  Alert, NAD, pleasant Pulm: respiratory rate nonlabored Abd: Soft, mildly distended, appropriately tender, +BS, incisions C/D/I  Lab Results:   Recent Labs  09/08/16 0409 09/09/16 0529  WBC 7.2 8.9  HGB 11.4* 11.3*  HCT 34.3* 33.8*  PLT 255 239   BMET No results for input(s): NA, K, CL, CO2, GLUCOSE, BUN, CREATININE, CALCIUM in the last 72 hours. PT/INR  Recent Labs  09/07/16 0507  LABPROT 16.2*  INR 1.29   CMP     Component Value Date/Time   NA 138 09/06/2016 0739   K 3.6 09/06/2016 0739   CL 101 09/06/2016 0739   CO2 30 09/06/2016 0739   GLUCOSE 107 (H) 09/06/2016 0739   BUN 7 09/06/2016 0739   CREATININE 0.89 09/06/2016 0739   CALCIUM 8.1 (L) 09/06/2016 0739   PROT 5.7 (L) 09/06/2016 0739   ALBUMIN 2.7 (L) 09/06/2016 0739   AST 19 09/06/2016 0739   ALT 12 (L) 09/06/2016 0739   ALKPHOS 57 09/06/2016 0739   BILITOT 1.0 09/06/2016 0739   GFRNONAA >60 09/06/2016 0739   GFRAA >60 09/06/2016 0739   Lipase  No results found for: LIPASE     Studies/Results: Dg Cholangiogram Operative  Result Date: 09/08/2016 CLINICAL DATA:  Intraoperative cholangiogram. EXAM: INTRAOPERATIVE CHOLANGIOGRAM  TECHNIQUE: Cholangiographic images from the C-arm fluoroscopic device were submitted for interpretation post-operatively. Please see the procedural report for the amount of contrast and the fluoroscopy time utilized. COMPARISON:  09/03/2016 FINDINGS: Common hepatic duct and common bile duct were opacified with contrast. Contrast drains into the duodenum. No obstructive filling defects or stones. IMPRESSION: Patent biliary system.  Normal intraoperative cholangiogram. Electronically Signed   By: Richarda Overlie M.D.   On: 09/08/2016 12:14    Anti-infectives: Anti-infectives    Start     Dose/Rate Route Frequency Ordered Stop   09/05/16 1800  piperacillin-tazobactam (ZOSYN) IVPB 3.375 g     3.375 g 12.5 mL/hr over 240 Minutes Intravenous Every 8 hours 09/05/16 1742         Assessment/Plan S/p LAPAROSCOPIC CHOLECYSTECTOMY WITH INTRAOPERATIVE CHOLANGIOGRAM 09/08/16 Dr. 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 home once anticoagulated. Does not need antibiotics upon discharge. Will follow-up with Dr. Ezzard Standing in 3 weeks.   LOS: 6 days    [name] , [facility] Surgery 09/09/2016, 7:57 AM Pager: [phone] Consults: [phone] Mon-Fri 7:00 am-4:30 pm Sat-Sun 7:00 am-11:30 am  Agree with above. Walked in hall twice so far  today. Started coumadin.  Ovidio Kinavid Chetara Kropp, MD, [facility] Pager: [phone] Office phone:  [phone]

## 2016-09-09 NOTE — Progress Notes (Signed)
ANTICOAGULATION CONSULT NOTE - Follow Up Consult  Pharmacy Consult:  Heparin + Warfarin Indication:  St. Jude MVR  Allergies  Allergen Reactions  . Cyclobenzaprine Hcl Nausea And Vomiting  . Latex Other (See Comments)    Unknown    Patient Measurements: Height: 5\' 4"  (162.6 cm) Weight: 160 lb 3.2 oz (72.7 kg) IBW/kg (Calculated) : 54.7 Heparin Dosing Weight: 72 kg  Vital Signs: Temp: 97.7 F (36.5 C) (10/17 1401) Temp Source: Oral (10/17 1401) BP: 136/61 (10/17 1401) Pulse Rate: 57 (10/17 1401)  Labs:  Recent Labs  09/07/16 0507 09/08/16 0409 09/09/16 0529  HGB 11.7* 11.4* 11.3*  HCT 34.9* 34.3* 33.8*  PLT 262 255 239  LABPROT 16.2*  --   --   INR 1.29  --   --   HEPARINUNFRC 0.42  --  0.44    Estimated Creatinine Clearance: 70.6 mL/min (by C-G formula based on SCr of 0.89 mg/dL).   Assessment: 55 YOF on Coumadin PTA for history of St. Jude MVR.  She was transitioned to IV heparin bridge for cholecystectomy - done on 10/16. Heparin was resumed post-op and warfarin is to restart today.   Heparin level this morning is therapeutic (HL 0.44, goal of 0.3-0.7). INR on 10/15 was 1.29 - the patient's PTA dose was 10 mg daily EXCEPT for 7.5 mg on T/Th - the patient confirms she has had therapeutic INRs on this dose for the past ~4 months.  The patient was re-educated today.  Goal of Therapy:  Heparin level 0.3-0.7 units/ml INR 2.5-3.5 Monitor platelets by anticoagulation protocol: Yes   Plan:  - Continue heparin gtt at 1250 units/hr - Warfarin 12.5 x 1 dose at 1800 today - Daily heparin level, INR, and CBC - Monitor for s/sx of bleeding  Thank you for allowing pharmacy to be a part of this patient's care.  Georgina PillionElizabeth Bass, PharmD, BCPS Clinical Pharmacist Pager: [phone] Clinical phone for 09/09/2016 from 7a-3:30p : [phone] If after 3:30p please call main pharmacy at [phone] 09/09/2016 2:37 PM

## 2016-09-09 NOTE — Progress Notes (Addendum)
[facility] PROGRESS NOTE  [name] NWG:[id number] DOB: Nov 08, 1961 DOA: 09/03/2016   PCP: Ignatius Specking, MD     Assessment/Plan: Principal Problem:   Acute cholecystitis Active Problems:   Chronic systolic heart failure (HCC)   MITRAL VALVE REPLACEMENT, HX OF   Nausea with vomiting   Anxiety state   Tobacco abuse   Coronary artery disease involving native coronary artery of native heart without angina pectoris   Mitral valve disease, rheumatic   CAD (coronary artery disease)   55 y.o. 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 She went to [facility] where CT showed gallbladder wall thickening and edema, concerning for developing acute cholecystitis. Acalculous cholecystitis dx by [place]. Patient is status post laparoscopic cholecystectomy on 10/16   Assessment/Plan Acute cholecystitis  Imaging studies show signs of biliary sludge and acalculus cholecystitis. Imaging reports in chart from [facility]. Cont tele, shows normal sinus rhythm - Morphine prn pain cardiac clearance  for possible laparoscopic cholecystectomy completed 10/14 -  Patient's was initiated on heparin drip for bridging  perioperatively EF was initially 25%, but improved to 35-40%, last echo 2013, repeat 2-D echo shows EF of 35-40% with akinesis and extensive scarring  nuclear stress test grossly abnormal, She is at least moderate risk for surgery.  If medically indicated, cardiology recommended to proceed with surgery. Now status post laparoscopic cholecystectomy with intraoperative cholangiogram on 10/16 Continue heparin drip, resumed Coumadin as per recommended by surgery   Coronary artery disease-at this point no cardiac cath indicated, Known CAD with prior LM stent.  Dr Margarita Mail notes from 2013 suggested life long plavix however she has been  noncompliant with this for several years. 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 Jude MVR on chronic anticoagulation therapy with subtherapeutic INR: Follow PT/INR, goal INR 2.5-3.5 - Heparin drip per pharmacy  , unfortunately patient needs to be on heparin drip , cannot transition to Lovenox as per Dr Eldridge Dace Combined CHF: I see up on file showed EF of 35-40% with grade 1 diastolic dysfunction. Without exacerbation    Tobacco abuse: Patient declined nicotine patch as she reports that they make her sick. Counseled about nicotine cessation   DVT prophylaxsis heparin gtt  Code Status:  Full code      Family Communication: Discussed in detail with the patient, all imaging results, lab results explained to the patient   Disposition Plan: Anticipate discharge when INR therapeutic       Consultants:  [facility]  cardiology  Procedures:  S/p LAPAROSCOPIC CHOLECYSTECTOMY WITH INTRAOPERATIVE CHOLANGIOGRAM 09/08/16 Dr. Ezzard Standing   Antibiotics: Anti-infectives    Start     Dose/Rate Route Frequency Ordered Stop   09/05/16 1800  piperacillin-tazobactam (ZOSYN) IVPB 3.375 g     3.375 g 12.5 mL/hr over 240 Minutes Intravenous Every 8 hours 09/05/16 1742           HPI/Subjective: Feeling better than prior to surgery. Tolerated dinner last night and feeling hungry this morning. No flatus or BM.   Objective: Vitals:   09/08/16  1739 09/08/16 2056 09/09/16 0234 09/09/16 0514  BP: (!) 153/67 (!) 134/58 (!) 165/63 (!) 156/67  Pulse: 67 (!) 57 (!) 52 (!) 58  Resp:  17 17 17   Temp: 97.8 F (36.6 C) 97.7 F (36.5 C) 97.6 F (36.4 C) 97.7 F (36.5 C)  TempSrc:  Oral Oral Oral Oral  SpO2: 94% 97% 97% 95%  Weight:      Height:        Intake/Output Summary (Last 24 hours) at 09/09/16 1124 Last data filed at 09/09/16 0700  Gross per 24 hour  Intake          1948.51 ml  Output             1175 ml  Net           773.51 ml    Exam:  Examination:  General exam: Appears calm and comfortable  Respiratory system: Clear to auscultation. Respiratory effort normal. Cardiovascular system: S1 & S2 heard, RRR. No JVD, murmurs, rubs, gallops or clicks. No pedal edema. Gastrointestinal system: Abdomen is nondistended, soft and nontender. No organomegaly or masses felt. Normal bowel sounds heard. Central nervous system: Alert and oriented. No focal neurological deficits. Extremities: Symmetric 5 x 5 power. Skin: No rashes, lesions or ulcers Psychiatry: Judgement and insight appear normal. Mood & affect appropriate.     Data Reviewed: I have personally reviewed following labs and imaging studies  Micro Results Recent Results (from the past 240 hour(s))  Surgical pcr screen     Status: None   Collection Time: 09/03/16 10:58 PM  Result Value Ref Range Status   MRSA, PCR NEGATIVE NEGATIVE Final   Staphylococcus aureus NEGATIVE NEGATIVE Final    Comment:        The Xpert SA Assay (FDA approved for NASAL specimens in patients over 48 years of age), is one component of a comprehensive surveillance program.  Test performance has been validated by [facility] for patients greater than or equal to 15 year old. It is not intended to diagnose infection nor to guide or monitor treatment.   Culture, blood (routine x 2)     Status: None (Preliminary result)   Collection Time: 09/05/16  7:23 PM  Result Value Ref Range Status   Specimen Description BLOOD RIGHT ANTECUBITAL  Final   Special Requests BOTTLES DRAWN AEROBIC AND ANAEROBIC 10CC  Final   Culture NO GROWTH 3 DAYS  Final   Report Status PENDING  Incomplete  Culture, blood (routine x 2)      Status: None (Preliminary result)   Collection Time: 09/05/16  7:30 PM  Result Value Ref Range Status   Specimen Description BLOOD RIGHT HAND  Final   Special Requests BOTTLES DRAWN AEROBIC ONLY 5CC  Final   Culture NO GROWTH 3 DAYS  Final   Report Status PENDING  Incomplete    Radiology Reports Dg Cholangiogram Operative  Result Date: 09/08/2016 CLINICAL DATA:  Intraoperative cholangiogram. EXAM: INTRAOPERATIVE CHOLANGIOGRAM TECHNIQUE: Cholangiographic images from the C-arm fluoroscopic device were submitted for interpretation post-operatively. Please see the procedural report for the amount of contrast and the fluoroscopy time utilized. COMPARISON:  09/03/2016 FINDINGS: Common hepatic duct and common bile duct were opacified with contrast. Contrast drains into the duodenum. No obstructive filling defects or stones. IMPRESSION: Patent biliary system.  Normal intraoperative cholangiogram. Electronically Signed   By: Richarda Overlie M.D.   On: 09/08/2016 12:14   Nm Myocar Multi W/spect W/wall Motion / Ef  Result Date: 09/05/2016 CLINICAL DATA:  Chest pain. Shortness of breath. Congestive heart failure. COPD. Previous myocardial infarct and stroke. Preop evaluation. EXAM: MYOCARDIAL IMAGING WITH SPECT (REST AND PHARMACOLOGIC-STRESS) GATED LEFT VENTRICULAR WALL MOTION STUDY LEFT VENTRICULAR EJECTION FRACTION TECHNIQUE: Standard myocardial SPECT imaging was performed after resting intravenous injection of 10 mCi Tc-36m tetrofosmin. Subsequently, intravenous infusion of Lexiscan was performed under the supervision of the Cardiology staff. At peak effect of the drug, 30 mCi Tc-103m tetrofosmin was injected intravenously and standard myocardial SPECT imaging was performed. Quantitative gated imaging was also performed to evaluate left ventricular wall motion, and estimate left ventricular ejection fraction. COMPARISON:  None. FINDINGS: Perfusion: Extensive decreased myocardial activity is seen on both the  stress and resting images involving the anterior, anteroseptal, and apical walls of the left ventricle, consistent with myocardial infarct. No areas of reversibility are seen to suggest the presence of inducible myocardial ischemia. Wall Motion: Moderate left ventricular dilatation. Global left ventricular hypokinesis. Left Ventricular Ejection Fraction: 6 % End diastolic volume 232 ml End systolic volume 217 ml IMPRESSION: 1. Extensive myocardial infarction involving the anterior, anteroseptal, and apical walls. No evidence of inducible ischemia. 2. Global left ventricular hypokinesis and left ventricular dilatation. 3. Left ventricular ejection fraction 6% 4. Non invasive risk stratification*: High *2012 Appropriate Use Criteria for Coronary Revascularization Focused Update: J Am Coll Cardiol. 2012;59(9):857-881. http://content.dementiazones.com.aspx?articleid=[email or web address] Electronically Signed   By: Myles Rosenthal M.D.   On: 09/05/2016 14:01     CBC  Recent Labs Lab 09/05/16 0509 09/06/16 0739 09/07/16 0507 09/08/16 0409 09/09/16 0529  WBC 9.6 8.4 9.9 7.2 8.9  HGB 11.9* 12.4 11.7* 11.4* 11.3*  HCT 35.2* 36.5 34.9* 34.3* 33.8*  PLT 241 256 262 255 239  MCV 95.1 95.5 95.4 96.6 96.8  MCH 32.2 32.5 32.0 32.1 32.4  MCHC 33.8 34.0 33.5 33.2 33.4  RDW 13.0 13.1 13.2 13.2 13.5    Chemistries   Recent Labs Lab 09/04/16 0515 09/05/16 0509 09/06/16 0739  NA 138 135 138  K 3.9 3.7 3.6  CL 103 101 101  CO2 28 22 30   GLUCOSE 95 71 107*  BUN <5* <5* 7  CREATININE 0.85 0.93 0.89  CALCIUM 8.2* 8.0* 8.1*  AST 14* 17 19  ALT 11* 11* 12*  ALKPHOS 58 56 57  BILITOT 0.4 0.9 1.0   ------------------------------------------------------------------------------------------------------------------ estimated creatinine clearance is 70.6 mL/min (by C-G formula based on SCr of 0.89  mg/dL). ------------------------------------------------------------------------------------------------------------------ No results for input(s): HGBA1C in the last 72 hours. ------------------------------------------------------------------------------------------------------------------ No results for input(s): CHOL, HDL, LDLCALC, TRIG, CHOLHDL, LDLDIRECT in the last 72 hours. ------------------------------------------------------------------------------------------------------------------ No results for input(s): TSH, T4TOTAL, T3FREE, THYROIDAB in the last 72 hours.  Invalid input(s): FREET3 ------------------------------------------------------------------------------------------------------------------ No results for input(s): VITAMINB12, FOLATE, FERRITIN, TIBC, IRON, RETICCTPCT in the last 72 hours.  Coagulation profile  Recent Labs Lab 09/03/16 2224 09/04/16 0515 09/05/16 0509 09/06/16 0739 09/07/16 0507  INR 1.87 1.83 1.60 1.63 1.29    No results for input(s): DDIMER in the last 72 hours.  Cardiac Enzymes No results for input(s): CKMB, TROPONINI, MYOGLOBIN in the last 168 hours.  Invalid input(s): CK ------------------------------------------------------------------------------------------------------------------ Invalid input(s): POCBNP   CBG: No results for input(s): GLUCAP in the last 168 hours.     Studies: Dg Cholangiogram Operative  Result Date: 09/08/2016 CLINICAL DATA:  Intraoperative cholangiogram. EXAM: INTRAOPERATIVE CHOLANGIOGRAM TECHNIQUE: Cholangiographic images from the C-arm fluoroscopic device were submitted for interpretation post-operatively. Please see the procedural report for the amount of contrast and the fluoroscopy time utilized. COMPARISON:  09/03/2016 FINDINGS: Common hepatic  duct and common bile duct were opacified with contrast. Contrast drains into the duodenum. No obstructive filling defects or stones. IMPRESSION: Patent biliary  system.  Normal intraoperative cholangiogram. Electronically Signed   By: Richarda OverlieAdam  Henn M.D.   On: 09/08/2016 12:14      Lab Results  Component Value Date   HGBA1C 6.4 (H) 09/24/2012   HGBA1C (H) 07/04/2010    6.1 (NOTE)                                                                       According to the ADA Clinical Practice Recommendations for 2011, when HbA1c is used as a screening test:   >=6.5%   Diagnostic of Diabetes Mellitus           (if abnormal result  is confirmed)  5.7-6.4%   Increased risk of developing Diabetes Mellitus  References:Diagnosis and Classification of Diabetes Mellitus,Diabetes Care,2011,34(Suppl 1):S62-S69 and Standards of Medical Care in         Diabetes - 2011,Diabetes Care,2011,34  (Suppl 1):S11-S61.   Lab Results  Component Value Date   LDLCALC 170 (H) 09/25/2012   CREATININE 0.89 09/06/2016       Scheduled Meds: . atorvastatin  20 mg Oral q1800  . carvedilol  6.25 mg Oral BID WC  . enalapril  5 mg Oral BID  . mouth rinse  15 mL Mouth Rinse BID  . piperacillin-tazobactam (ZOSYN)  IV  3.375 g Intravenous Q8H   Continuous Infusions: . heparin    . heparin 1,250 Units/hr (09/08/16 2204)  . lactated ringers 10 mL/hr at 09/08/16 1019     LOS: 6 days    Time spent: >30 MINS    [facility]BROL,Ellawyn Wogan  [facility] Pager [phone]. If 7PM-7AM, please contact night-coverage at [email or web address], password [email or web address] 09/09/2016, 11:24 AM  LOS: 6 days

## 2016-09-10 LAB — CULTURE, BLOOD (ROUTINE X 2)
CULTURE: NO GROWTH
CULTURE: NO GROWTH

## 2016-09-10 LAB — CBC
HCT: 33.4 % — ABNORMAL LOW (ref 36.0–46.0)
HEMOGLOBIN: 11 g/dL — AB (ref 12.0–15.0)
MCH: 31.8 pg (ref 26.0–34.0)
MCHC: 32.9 g/dL (ref 30.0–36.0)
MCV: 96.5 fL (ref 78.0–100.0)
PLATELETS: 275 10*3/uL (ref 150–400)
RBC: 3.46 MIL/uL — ABNORMAL LOW (ref 3.87–5.11)
RDW: 13.6 % (ref 11.5–15.5)
WBC: 9.9 10*3/uL (ref 4.0–10.5)

## 2016-09-10 LAB — BASIC METABOLIC PANEL
Anion gap: 9 (ref 5–15)
BUN: <5 mg/dL — ABNORMAL LOW (ref 6–20)
CALCIUM: 8.1 mg/dL — AB (ref 8.9–10.3)
CO2: 28 mmol/L (ref 22–32)
CREATININE: 0.88 mg/dL (ref 0.44–1.00)
Chloride: 99 mmol/L — ABNORMAL LOW (ref 101–111)
GFR calc Af Amer: >60 mL/min (ref >60)
GLUCOSE: 114 mg/dL — AB (ref 65–99)
Potassium: 3.2 mmol/L — ABNORMAL LOW (ref 3.5–5.1)
SODIUM: 136 mmol/L (ref 135–145)

## 2016-09-10 LAB — PROTIME-INR
INR: 1.28
PROTHROMBIN TIME: 16.1 s — AB (ref 11.4–15.2)

## 2016-09-10 LAB — HEPARIN LEVEL (UNFRACTIONATED): HEPARIN UNFRACTIONATED: 0.46 [IU]/mL (ref 0.30–0.70)

## 2016-09-10 MED ORDER — POTASSIUM CHLORIDE CRYS ER 20 MEQ PO TBCR
40.0000 meq | EXTENDED_RELEASE_TABLET | Freq: Two times a day (BID) | ORAL | Status: DC
  Administered 2016-09-10: 40 meq via ORAL
  Filled 2016-09-10: qty 2

## 2016-09-10 MED ORDER — POTASSIUM CHLORIDE CRYS ER 20 MEQ PO TBCR
40.0000 meq | EXTENDED_RELEASE_TABLET | Freq: Two times a day (BID) | ORAL | Status: AC
  Administered 2016-09-10 – 2016-09-11 (×3): 40 meq via ORAL
  Filled 2016-09-10 (×3): qty 2

## 2016-09-10 MED ORDER — WARFARIN SODIUM 5 MG PO TABS
12.5000 mg | ORAL_TABLET | Freq: Once | ORAL | Status: AC
  Administered 2016-09-10: 12.5 mg via ORAL
  Filled 2016-09-10: qty 2.5

## 2016-09-10 NOTE — Progress Notes (Signed)
[facility] PROGRESS NOTE  [name] ZOX:[id number] DOB: 19-Nov-1961 DOA: 09/03/2016   PCP: Ignatius Specking, MD     Assessment/Plan: Principal Problem:   Acute cholecystitis Active Problems:   Chronic systolic heart failure (HCC)   MITRAL VALVE REPLACEMENT, HX OF   Nausea with vomiting   Anxiety state   Tobacco abuse   Coronary artery disease involving native coronary artery of native heart without angina pectoris   Mitral valve disease, rheumatic   CAD (coronary artery disease)   55 y.o. 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 She went to [facility] where CT showed gallbladder wall thickening and edema, concerning for developing acute cholecystitis. Acalculous cholecystitis dx by [place]. Patient is status post laparoscopic cholecystectomy on 10/16   Assessment/Plan Acute cholecystitis  Imaging studies show signs of biliary sludge and acalculus cholecystitis. Imaging reports in chart from [facility]. cardiac clearance  for possible laparoscopic cholecystectomy completed 10/14 -  Patient's was initiated on heparin drip for bridging  perioperatively EF 35-40%, last echo 2013, repeat 2-D echo this admission shows EF of 35-40% with akinesis and extensive scarring  nuclear stress test grossly abnormal, She is at least moderate risk for surgery.  If medically indicated, cardiology recommended to proceed with surgery. Now status post laparoscopic cholecystectomy with intraoperative cholangiogram on 10/16 Continue heparin drip, resumed Coumadin as per recommended by surgery Otherwise ready for discharge from general surgery standpoint. Encourage mobilization. Does not need antibiotics upon discharge. Will follow-up with Dr. Ezzard Standing in 3 weeks.  Coronary artery disease-at this point no cardiac cath indicated, Known CAD with prior LM  stent.  Dr Margarita Mail notes from 2013 suggested life long plavix however she has been noncompliant with this for several years. Patient has not been taking Plavix Cardiology started patient on Coreg Stable postoperatively   Chronic systolic heart failure/ischemic cardiomyopathy-EF 35-40%, without exacerbation Started on Coreg, continue enalapril Euvolemic  Nausea and vomiting - Zofran prn  Anxiety  - Change Xanax po to Ativan IV prn   Rheumatic heart disease   S/p St. 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      Tobacco abuse: Patient declined nicotine patch as she reports that they make her sick. Counseled about nicotine cessation   DVT prophylaxsis heparin gtt  Code Status:  Full code      Family Communication: Discussed in detail with the patient, all imaging results, lab results explained to the patient   Disposition Plan: Anticipate discharge when INR therapeutic       Consultants:  CCS  cardiology  Procedures:  S/p LAPAROSCOPIC CHOLECYSTECTOMY WITH INTRAOPERATIVE CHOLANGIOGRAM 09/08/16 Dr. Ezzard Standing   Antibiotics: Anti-infectives    Start     Dose/Rate Route Frequency Ordered Stop   09/05/16 1800  piperacillin-tazobactam (ZOSYN) IVPB 3.375 g     3.375 g 12.5 mL/hr over 240 Minutes Intravenous Every 8 hours 09/05/16 1742           HPI/Subjective: Feeling better than prior to surgery. Eating had a BM.   Objective: Vitals:  09/09/16 0514 09/09/16 1401 09/09/16 2157 09/10/16 0606  BP: (!) 156/67 136/61 (!) 159/52 (!) 138/49  Pulse: (!) 58 (!) 57 (!) 57 62  Resp: 17 18 17 16   Temp: 97.7 F (36.5 C) 97.7 F (36.5 C) 98 F (36.7 C) 98.4 F (36.9 C)   TempSrc: Oral Oral  Oral  SpO2: 95% 94% 96% 94%  Weight:      Height:        Intake/Output Summary (Last 24 hours) at 09/10/16 0957 Last data filed at 09/10/16 [id number]  Gross per 24 hour  Intake              868 ml  Output                0 ml  Net              868 ml    Exam:  Examination:  General exam: Appears calm and comfortable  Respiratory system: Clear to auscultation. Respiratory effort normal. Cardiovascular system: S1 & S2 heard, RRR. No JVD, murmurs, rubs, gallops or clicks. No pedal edema. Gastrointestinal system: Abdomen is nondistended, soft and nontender. No organomegaly or masses felt. Normal bowel sounds heard. Central nervous system: Alert and oriented. No focal neurological deficits. Extremities: Symmetric 5 x 5 power. Skin: No rashes, lesions or ulcers Psychiatry: Judgement and insight appear normal. Mood & affect appropriate.     Data Reviewed: I have personally reviewed following labs and imaging studies  Micro Results Recent Results (from the past 240 hour(s))  Surgical pcr screen     Status: None   Collection Time: 09/03/16 10:58 PM  Result Value Ref Range Status   MRSA, PCR NEGATIVE NEGATIVE Final   Staphylococcus aureus NEGATIVE NEGATIVE Final    Comment:        The Xpert SA Assay (FDA approved for NASAL specimens in patients over 54 years of age), is one component of a comprehensive surveillance program.  Test performance has been validated by [facility] for patients greater than or equal to 54 year old. It is not intended to diagnose infection nor to guide or monitor treatment.   Culture, blood (routine x 2)     Status: None (Preliminary result)   Collection Time: 09/05/16  7:23 PM  Result Value Ref Range Status   Specimen Description BLOOD RIGHT ANTECUBITAL  Final   Special Requests BOTTLES DRAWN AEROBIC AND ANAEROBIC 10CC  Final   Culture NO GROWTH 4 DAYS  Final   Report Status PENDING  Incomplete  Culture, blood (routine x 2)      Status: None (Preliminary result)   Collection Time: 09/05/16  7:30 PM  Result Value Ref Range Status   Specimen Description BLOOD RIGHT HAND  Final   Special Requests BOTTLES DRAWN AEROBIC ONLY 5CC  Final   Culture NO GROWTH 4 DAYS  Final   Report Status PENDING  Incomplete    Radiology Reports Dg Cholangiogram Operative  Result Date: 09/08/2016 CLINICAL DATA:  Intraoperative cholangiogram. EXAM: INTRAOPERATIVE CHOLANGIOGRAM TECHNIQUE: Cholangiographic images from the C-arm fluoroscopic device were submitted for interpretation post-operatively. Please see the procedural report for the amount of contrast and the fluoroscopy time utilized. COMPARISON:  09/03/2016 FINDINGS: Common hepatic duct and common bile duct were opacified with contrast. Contrast drains into the duodenum. No obstructive filling defects or stones. IMPRESSION: Patent biliary system.  Normal intraoperative cholangiogram. Electronically Signed   By: Richarda OverlieAdam  Henn M.D.   On: 09/08/2016 12:14   Nm Myocar Multi W/spect  W/wall Motion / Ef  Result Date: 09/05/2016 CLINICAL DATA:  Chest pain. Shortness of breath. Congestive heart failure. COPD. Previous myocardial infarct and stroke. Preop evaluation. EXAM: MYOCARDIAL IMAGING WITH SPECT (REST AND PHARMACOLOGIC-STRESS) GATED LEFT VENTRICULAR WALL MOTION STUDY LEFT VENTRICULAR EJECTION FRACTION TECHNIQUE: Standard myocardial SPECT imaging was performed after resting intravenous injection of 10 mCi Tc-24m tetrofosmin. Subsequently, intravenous infusion of Lexiscan was performed under the supervision of the Cardiology staff. At peak effect of the drug, 30 mCi Tc-42m tetrofosmin was injected intravenously and standard myocardial SPECT imaging was performed. Quantitative gated imaging was also performed to evaluate left ventricular wall motion, and estimate left ventricular ejection fraction. COMPARISON:  None. FINDINGS: Perfusion: Extensive decreased myocardial activity is seen on both the  stress and resting images involving the anterior, anteroseptal, and apical walls of the left ventricle, consistent with myocardial infarct. No areas of reversibility are seen to suggest the presence of inducible myocardial ischemia. Wall Motion: Moderate left ventricular dilatation. Global left ventricular hypokinesis. Left Ventricular Ejection Fraction: 6 % End diastolic volume 232 ml End systolic volume 217 ml IMPRESSION: 1. Extensive myocardial infarction involving the anterior, anteroseptal, and apical walls. No evidence of inducible ischemia. 2. Global left ventricular hypokinesis and left ventricular dilatation. 3. Left ventricular ejection fraction 6% 4. Non invasive risk stratification*: High *2012 Appropriate Use Criteria for Coronary Revascularization Focused Update: J Am Coll Cardiol. 2012;59(9):857-881. http://content.dementiazones.com.aspx?articleid=[email or web address] Electronically Signed   By: Myles Rosenthal M.D.   On: 09/05/2016 14:01     CBC  Recent Labs Lab 09/06/16 0739 09/07/16 0507 09/08/16 0409 09/09/16 0529 09/10/16 0517  WBC 8.4 9.9 7.2 8.9 9.9  HGB 12.4 11.7* 11.4* 11.3* 11.0*  HCT 36.5 34.9* 34.3* 33.8* 33.4*  PLT 256 262 255 239 275  MCV 95.5 95.4 96.6 96.8 96.5  MCH 32.5 32.0 32.1 32.4 31.8  MCHC 34.0 33.5 33.2 33.4 32.9  RDW 13.1 13.2 13.2 13.5 13.6    Chemistries   Recent Labs Lab 09/04/16 0515 09/05/16 0509 09/06/16 0739 09/10/16 0517  NA 138 135 138 136  K 3.9 3.7 3.6 3.2*  CL 103 101 101 99*  CO2 28 22 30 28   GLUCOSE 95 71 107* 114*  BUN <5* <5* 7 <5*  CREATININE 0.85 0.93 0.89 0.88  CALCIUM 8.2* 8.0* 8.1* 8.1*  AST 14* 17 19  --   ALT 11* 11* 12*  --   ALKPHOS 58 56 57  --   BILITOT 0.4 0.9 1.0  --    ------------------------------------------------------------------------------------------------------------------ estimated creatinine clearance is 71.4 mL/min (by C-G formula based on SCr of 0.88  mg/dL). ------------------------------------------------------------------------------------------------------------------ No results for input(s): HGBA1C in the last 72 hours. ------------------------------------------------------------------------------------------------------------------ No results for input(s): CHOL, HDL, LDLCALC, TRIG, CHOLHDL, LDLDIRECT in the last 72 hours. ------------------------------------------------------------------------------------------------------------------ No results for input(s): TSH, T4TOTAL, T3FREE, THYROIDAB in the last 72 hours.  Invalid input(s): FREET3 ------------------------------------------------------------------------------------------------------------------ No results for input(s): VITAMINB12, FOLATE, FERRITIN, TIBC, IRON, RETICCTPCT in the last 72 hours.  Coagulation profile  Recent Labs Lab 09/04/16 0515 09/05/16 0509 09/06/16 0739 09/07/16 0507 09/10/16 0517  INR 1.83 1.60 1.63 1.29 1.28    No results for input(s): DDIMER in the last 72 hours.  Cardiac Enzymes No results for input(s): CKMB, TROPONINI, MYOGLOBIN in the last 168 hours.  Invalid input(s): CK ------------------------------------------------------------------------------------------------------------------ Invalid input(s): POCBNP   CBG: No results for input(s): GLUCAP in the last 168 hours.     Studies: Dg Cholangiogram Operative  Result Date: 09/08/2016 CLINICAL DATA:  Intraoperative cholangiogram. EXAM: INTRAOPERATIVE CHOLANGIOGRAM TECHNIQUE:  Cholangiographic images from the C-arm fluoroscopic device were submitted for interpretation post-operatively. Please see the procedural report for the amount of contrast and the fluoroscopy time utilized. COMPARISON:  09/03/2016 FINDINGS: Common hepatic duct and common bile duct were opacified with contrast. Contrast drains into the duodenum. No obstructive filling defects or stones. IMPRESSION: Patent biliary  system.  Normal intraoperative cholangiogram. Electronically Signed   By: Richarda Overlie M.D.   On: 09/08/2016 12:14      Lab Results  Component Value Date   HGBA1C 6.4 (H) 09/24/2012   HGBA1C (H) 07/04/2010    6.1 (NOTE)                                                                       According to the ADA Clinical Practice Recommendations for 2011, when HbA1c is used as a screening test:   >=6.5%   Diagnostic of Diabetes Mellitus           (if abnormal result  is confirmed)  5.7-6.4%   Increased risk of developing Diabetes Mellitus  References:Diagnosis and Classification of Diabetes Mellitus,Diabetes Care,2011,34(Suppl 1):S62-S69 and Standards of Medical Care in         Diabetes - 2011,Diabetes Care,2011,34  (Suppl 1):S11-S61.   Lab Results  Component Value Date   LDLCALC 170 (H) 09/25/2012   CREATININE 0.88 09/10/2016       Scheduled Meds: . atorvastatin  20 mg Oral q1800  . carvedilol  6.25 mg Oral BID WC  . enalapril  5 mg Oral BID  . mouth rinse  15 mL Mouth Rinse BID  . piperacillin-tazobactam (ZOSYN)  IV  3.375 g Intravenous Q8H  . Warfarin - Pharmacist Dosing Inpatient   Does not apply q1800   Continuous Infusions: . heparin 1,250 Units/hr (09/09/16 1636)  . lactated ringers 10 mL/hr at 09/08/16 1019     LOS: 7 days    Time spent: >30 MINS    [facility]  [facility] Pager [phone]. If 7PM-7AM, please contact night-coverage at [email or web address], password [facility] 09/10/2016, 9:57 AM  LOS: 7 days

## 2016-09-10 NOTE — Progress Notes (Signed)
ANTICOAGULATION CONSULT NOTE - Follow Up Consult  Pharmacy Consult:  Heparin + Warfarin Indication:  St. Jude MVR  Allergies  Allergen Reactions  . Cyclobenzaprine Hcl Nausea And Vomiting  . Latex Other (See Comments)    Unknown    Patient Measurements: Height: 5\' 4"  (162.6 cm) Weight: 160 lb 3.2 oz (72.7 kg) IBW/kg (Calculated) : 54.7 Heparin Dosing Weight: 72 kg  Vital Signs: Temp: 98.4 F (36.9 C) (10/18 0606) Temp Source: Oral (10/18 0606) BP: 138/49 (10/18 0606) Pulse Rate: 62 (10/18 0606)  Labs:  Recent Labs  09/08/16 0409 09/09/16 0529 09/10/16 0517  HGB 11.4* 11.3* 11.0*  HCT 34.3* 33.8* 33.4*  PLT 255 239 275  LABPROT  --   --  16.1*  INR  --   --  1.28  HEPARINUNFRC  --  0.44 0.46  CREATININE  --   --  0.88    Estimated Creatinine Clearance: 71.4 mL/min (by C-G formula based on SCr of 0.88 mg/dL).   Assessment: 55 YOF on Coumadin PTA for history of St. Jude MVR.  She was transitioned to IV heparin bridge for cholecystectomy - done on 10/16. Heparin was resumed post-op and warfarin is to restart today.   Heparin level this morning is therapeutic (HL 0.46, goal of 0.3-0.7). INR today is SUBtherapeutic (INR 1.28, goal of 2.5-3.5).   The patient's PTA dose was 10 mg daily EXCEPT for 7.5 mg on T/Th - the patient confirms she has had therapeutic INRs on this dose for the past ~4 months.  The patient was re-educated this admission.  Goal of Therapy:  Heparin level 0.3-0.7 units/ml INR 2.5-3.5 Monitor platelets by anticoagulation protocol: Yes   Plan:  - Continue heparin gtt at 1250 units/hr - Repeat Warfarin 12.5 x 1 dose at 1800 today - Daily heparin level, INR, and CBC - Monitor for s/sx of bleeding  Thank you for allowing pharmacy to be a part of this patient's care.  Georgina PillionElizabeth Linnemann, PharmD, BCPS Clinical Pharmacist Pager: [phone] Clinical phone for 09/10/2016 from 7a-3:30p : [phone] If after 3:30p please call main pharmacy at  [phone] 09/10/2016 2:04 PM

## 2016-09-10 NOTE — Progress Notes (Signed)
Patient ID: Brooke BoschMarlana W Lindsley, female   DOB: January 01, 1961, 55 y.o.   MRN: [id number]  [facility]  2 Days Post-Op  Subjective: No new complaints. Abdomen still sore. Tolerating diet. +flatus no Bm. Ambulated twice in hall yesterday.  Objective: Vital signs in last 24 hours: Temp:  [97.7 F (36.5 C)-98.4 F (36.9 C)] 98.4 F (36.9 C) (10/18 0606) Pulse Rate:  [57-62] 62 (10/18 0606) Resp:  [16-18] 16 (10/18 0606) BP: (136-159)/(49-61) 138/49 (10/18 0606) SpO2:  [94 %-96 %] 94 % (10/18 0606) Last BM Date: 09/06/16  Intake/Output from previous day: 10/17 0701 - 10/18 0700 In: 868 [P.O.:240; I.V.:528; IV Piggyback:100] Out: -  Intake/Output this shift: No intake/output data recorded.  PE: Gen:  Alert, NAD, pleasant Pulm: respiratory rate nonlabored Abd: Soft, mildly distended, appropriately tender, +BS, incisions C/D/I  Lab Results:   Recent Labs  09/09/16 0529 09/10/16 0517  WBC 8.9 9.9  HGB 11.3* 11.0*  HCT 33.8* 33.4*  PLT 239 275   BMET  Recent Labs  09/10/16 0517  NA 136  K 3.2*  CL 99*  CO2 28  GLUCOSE 114*  BUN <5*  CREATININE 0.88  CALCIUM 8.1*   PT/INR  Recent Labs  09/10/16 0517  LABPROT 16.1*  INR 1.28   CMP     Component Value Date/Time   NA 136 09/10/2016 0517   K 3.2 (L) 09/10/2016 0517   CL 99 (L) 09/10/2016 0517   CO2 28 09/10/2016 0517   GLUCOSE 114 (H) 09/10/2016 0517   BUN <5 (L) 09/10/2016 0517   CREATININE 0.88 09/10/2016 0517   CALCIUM 8.1 (L) 09/10/2016 0517   PROT 5.7 (L) 09/06/2016 0739   ALBUMIN 2.7 (L) 09/06/2016 0739   AST 19 09/06/2016 0739   ALT 12 (L) 09/06/2016 0739   ALKPHOS 57 09/06/2016 0739   BILITOT 1.0 09/06/2016 0739   GFRNONAA >60 09/10/2016 0517   GFRAA >60 09/10/2016 0517   Lipase  No results found for: LIPASE     Studies/Results: Dg Cholangiogram Operative  Result Date: 09/08/2016 CLINICAL DATA:  Intraoperative cholangiogram. EXAM: INTRAOPERATIVE CHOLANGIOGRAM  TECHNIQUE: Cholangiographic images from the C-arm fluoroscopic device were submitted for interpretation post-operatively. Please see the procedural report for the amount of contrast and the fluoroscopy time utilized. COMPARISON:  09/03/2016 FINDINGS: Common hepatic duct and common bile duct were opacified with contrast. Contrast drains into the duodenum. No obstructive filling defects or stones. IMPRESSION: Patent biliary system.  Normal intraoperative cholangiogram. Electronically Signed   By: Richarda OverlieAdam  Henn M.D.   On: 09/08/2016 12:14    Anti-infectives: Anti-infectives    Start     Dose/Rate Route Frequency Ordered Stop   09/05/16 1800  piperacillin-tazobactam (ZOSYN) IVPB 3.375 g     3.375 g 12.5 mL/hr over 240 Minutes Intravenous Every 8 hours 09/05/16 1742         Assessment/Plan S/p LAPAROSCOPIC CHOLECYSTECTOMY WITH INTRAOPERATIVE CHOLANGIOGRAM 09/08/16 Dr. Ezzard StandingNewman - POD 2 - tolerating diet, +flatus - restarted on coumadin. Waiting for INR to reach goal of 2.5-3.5. Today 1.28  History of St Jude mech MV on coumadin, L main stent 2011, S-CHF w/ EF 40% 2013 COPD Anxiety HLD HTN Chronic back pain Tobacco abuse  FEN - regular  Plan - Home once INR reaches 2.5-3.5 per medicine. Otherwise ready for discharge from general surgery standpoint. Encourage mobilization. Does not need antibiotics upon discharge. Will follow-up with Dr. Ezzard StandingNewman in 3 weeks.   LOS: 7 days    BROOKE A MILLER ,  [facility] 09/10/2016, 9:25 AM Pager: [phone] Consults: [phone] Mon-Fri 7:00 am-4:30 pm Sat-Sun 7:00 am-11:30 am  Agree with above. Looks better.  Still needs to ambulate more.  She has not been out of the room all day.  Not sure what to write in Epic to get patient to move more.  Will need robotic assistant.  Ovidio Kin, MD, [facility] Pager: [phone] Office phone:  [phone]

## 2016-09-11 LAB — BASIC METABOLIC PANEL
Anion gap: 8 (ref 5–15)
CHLORIDE: 105 mmol/L (ref 101–111)
CO2: 27 mmol/L (ref 22–32)
CREATININE: 0.86 mg/dL (ref 0.44–1.00)
Calcium: 8 mg/dL — ABNORMAL LOW (ref 8.9–10.3)
GFR calc non Af Amer: >60 mL/min (ref >60)
Glucose, Bld: 96 mg/dL (ref 65–99)
Potassium: 3.8 mmol/L (ref 3.5–5.1)
Sodium: 140 mmol/L (ref 135–145)

## 2016-09-11 LAB — CBC
HCT: 33.5 % — ABNORMAL LOW (ref 36.0–46.0)
Hemoglobin: 10.9 g/dL — ABNORMAL LOW (ref 12.0–15.0)
MCH: 31.7 pg (ref 26.0–34.0)
MCHC: 32.5 g/dL (ref 30.0–36.0)
MCV: 97.4 fL (ref 78.0–100.0)
PLATELETS: 262 10*3/uL (ref 150–400)
RBC: 3.44 MIL/uL — ABNORMAL LOW (ref 3.87–5.11)
RDW: 14 % (ref 11.5–15.5)
WBC: 9.1 10*3/uL (ref 4.0–10.5)

## 2016-09-11 LAB — HEPARIN LEVEL (UNFRACTIONATED)
HEPARIN UNFRACTIONATED: 0.25 [IU]/mL — AB (ref 0.30–0.70)
HEPARIN UNFRACTIONATED: 0.61 [IU]/mL (ref 0.30–0.70)
Heparin Unfractionated: 0.64 IU/mL (ref 0.30–0.70)

## 2016-09-11 LAB — MAGNESIUM: Magnesium: 1.9 mg/dL (ref 1.7–2.4)

## 2016-09-11 LAB — PROTIME-INR
INR: 2.28
Prothrombin Time: 25.5 seconds — ABNORMAL HIGH (ref 11.4–15.2)

## 2016-09-11 MED ORDER — WARFARIN SODIUM 5 MG PO TABS
5.0000 mg | ORAL_TABLET | Freq: Once | ORAL | Status: AC
  Administered 2016-09-11: 5 mg via ORAL
  Filled 2016-09-11: qty 1

## 2016-09-11 NOTE — Progress Notes (Signed)
ANTICOAGULATION CONSULT NOTE - Follow Up Consult  Pharmacy Consult for Heparin and  Coumadin Indication: St. Jude MVR  Allergies  Allergen Reactions  . Cyclobenzaprine Hcl Nausea And Vomiting  . Latex Other (See Comments)    Unknown    Patient Measurements: Height: 5\' 4"  (162.6 cm) Weight: 160 lb 3.2 oz (72.7 kg) IBW/kg (Calculated) : 54.7 Heparin Dosing Weight: 72  Vital Signs: Temp: 98 F (36.7 C) (10/19 0512) Temp Source: Oral (10/19 0512) BP: 159/67 (10/19 0512) Pulse Rate: 55 (10/19 0512)  Labs:  Recent Labs  09/09/16 0529 09/10/16 0517 09/11/16 0450  HGB 11.3* 11.0* 10.9*  HCT 33.8* 33.4* 33.5*  PLT 239 275 262  LABPROT  --  16.1* 25.5*  INR  --  1.28 2.28  HEPARINUNFRC 0.44 0.46 0.25*  CREATININE  --  0.88  --     Estimated Creatinine Clearance: 71.4 mL/min (by C-G formula based on SCr of 0.88 mg/dL).   Medications:  Scheduled:  . atorvastatin  20 mg Oral q1800  . carvedilol  6.25 mg Oral BID WC  . enalapril  5 mg Oral BID  . mouth rinse  15 mL Mouth Rinse BID  . piperacillin-tazobactam (ZOSYN)  IV  3.375 g Intravenous Q8H  . potassium chloride  40 mEq Oral BID  . Warfarin - Pharmacist Dosing Inpatient   Does not apply q1800    Assessment: 55 YOF on Coumadin PTA for history of St. Jude MVR.  She was transitioned to IV heparin bridge for cholecystectomy - done on 10/16. Heparin was resumed post-op and warfarin restarted 10/17.    Heparin level this morning is therapeutic (HL 0.25, goal of 0.3-0.7). INR today is SUBtherapeutic (INR 2.28, goal of 2.5-3.5).  Lg jump in INR after 2 doses.  Hg stable and pltc wnl.  No bleeding  The patient's PTA dose was 10 mg daily EXCEPT for 7.5 mg on T/Th - the patient confirms she has had therapeutic INRs on this dose for the past ~4 months.  The patient was re-educated this admission.  Goal of Therapy:  Heparin level 0.3-0.7 units/ml  INR 2.5-3.5 (mechanical valve) Monitor platelets by anticoagulation protocol:  Yes   Plan:  Increase Heparin to 1400 units/hr Repeat heparin level 6hr Coumadin 5mg  today Continue daily HL, CBC, INR Watch for s/s of bleeding   Marisue HumbleKendra Avanna Sowder, PharmD Clinical Pharmacist [facility]

## 2016-09-11 NOTE — Progress Notes (Signed)
ANTICOAGULATION CONSULT NOTE - Follow Up Consult  Pharmacy Consult for Heparin Indication: St. Jude MVR  Allergies  Allergen Reactions  . Cyclobenzaprine Hcl Nausea And Vomiting  . Latex Other (See Comments)    Unknown    Patient Measurements: Height: 5\' 4"  (162.6 cm) Weight: 160 lb 3.2 oz (72.7 kg) IBW/kg (Calculated) : 54.7 Heparin Dosing Weight: 72  Vital Signs: Temp: 98 F (36.7 C) (10/19 0512) Temp Source: Oral (10/19 0512) BP: 159/67 (10/19 0512) Pulse Rate: 55 (10/19 0512)  Labs:  Recent Labs  09/09/16 0529 09/10/16 0517 09/11/16 0450 09/11/16 1328  HGB 11.3* 11.0* 10.9*  --   HCT 33.8* 33.4* 33.5*  --   PLT 239 275 262  --   LABPROT  --  16.1* 25.5*  --   INR  --  1.28 2.28  --   HEPARINUNFRC 0.44 0.46 0.25* 0.61  CREATININE  --  0.88 0.86  --     Estimated Creatinine Clearance: 73.1 mL/min (by C-G formula based on SCr of 0.86 mg/dL).   Medications:  Scheduled:  . atorvastatin  20 mg Oral q1800  . carvedilol  6.25 mg Oral BID WC  . enalapril  5 mg Oral BID  . mouth rinse  15 mL Mouth Rinse BID  . piperacillin-tazobactam (ZOSYN)  IV  3.375 g Intravenous Q8H  . potassium chloride  40 mEq Oral BID  . warfarin  5 mg Oral ONCE-1800  . Warfarin - Pharmacist Dosing Inpatient   Does not apply q1800    Assessment: 55 YOF on warfarin PTA for history of St. Jude MVR.  She was transitioned to IV heparin bridge for cholecystectomy - done on 10/16. Heparin was resumed post-op and warfarin restarted 10/17.    Heparin level today is therapeutic after increased rate this morning (HL 0.61, goal of 0.3-0.7). Hg stable and pltc wnl.  No bleeding  Goal of Therapy:  Heparin level 0.3-0.7 units/ml  INR 2.5-3.5 (mechanical valve) Monitor platelets by anticoagulation protocol: Yes   Plan:  Continue Heparin 1400 units/hr Repeat heparin level 6hr Continue daily HL, CBC, INR Watch for s/s of bleeding   Thank you for allowing us to participate in this patients  care. Signe Coltonya C Journee Kohen, PharmD Pager: [phone]

## 2016-09-11 NOTE — Progress Notes (Signed)
ANTICOAGULATION CONSULT NOTE - Follow Up Consult  Pharmacy Consult for Heparin Indication: St. Jude MVR  Allergies  Allergen Reactions  . Cyclobenzaprine Hcl Nausea And Vomiting  . Latex Other (See Comments)    Unknown    Patient Measurements: Height: 5\' 4"  (162.6 cm) Weight: 160 lb 3.2 oz (72.7 kg) IBW/kg (Calculated) : 54.7 Heparin Dosing Weight: 72  Vital Signs: Temp: 98.1 F (36.7 C) (10/19 2121) Temp Source: Oral (10/19 2121) BP: 151/61 (10/19 2121) Pulse Rate: 51 (10/19 2121)  Labs:  Recent Labs  09/09/16 0529 09/10/16 0517 09/11/16 0450 09/11/16 1328 09/11/16 2046  HGB 11.3* 11.0* 10.9*  --   --   HCT 33.8* 33.4* 33.5*  --   --   PLT 239 275 262  --   --   LABPROT  --  16.1* 25.5*  --   --   INR  --  1.28 2.28  --   --   HEPARINUNFRC 0.44 0.46 0.25* 0.61 0.64  CREATININE  --  0.88 0.86  --   --     Estimated Creatinine Clearance: 73.1 mL/min (by C-G formula based on SCr of 0.86 mg/dL).   Medications:  Scheduled:  . atorvastatin  20 mg Oral q1800  . carvedilol  6.25 mg Oral BID WC  . enalapril  5 mg Oral BID  . mouth rinse  15 mL Mouth Rinse BID  . piperacillin-tazobactam (ZOSYN)  IV  3.375 g Intravenous Q8H  . Warfarin - Pharmacist Dosing Inpatient   Does not apply q1800    Assessment: 55 YOF on warfarin PTA for history of St. Jude MVR.  She was transitioned to IV heparin bridge for cholecystectomy - done on 10/16. Heparin was resumed post-op and warfarin restarted 10/17.    Confirmatory heparin level today is therapeutic at 0.64  Goal of Therapy:  Heparin level 0.3-0.7 units/ml  INR 2.5-3.5 (mechanical valve) Monitor platelets by anticoagulation protocol: Yes   Plan:  Continue Heparin 1400 units/hr Continue daily HL, CBC, INR Watch for s/s of bleeding   Thank you for allowing us to participate in this patients care.  Bayard HuggerMei Shelby Anderle, PharmD, BCPS  Clinical Pharmacist  Pager: [phone]

## 2016-09-11 NOTE — Progress Notes (Signed)
Patient ID: [name], female   DOB: [birth year], 55 y.o.   MRN: [id number]  [facility]  3 Days Post-Op  Subjective: Sitting in bed drinking coffee. Feeling well and tolerating diet. Ambulated more yesterday and showered this morning which made her feel better. INR increasing but still subtherapeutic.  Objective: Vital signs in last 24 hours: Temp:  [98 F (36.7 C)-98.4 F (36.9 C)] 98 F (36.7 C) (10/19 0512) Pulse Rate:  [51-55] 55 (10/19 0512) Resp:  [16-18] 18 (10/19 0512) BP: (145-159)/(62-67) 159/67 (10/19 0512) SpO2:  [93 %-94 %] 94 % (10/19 0512) Last BM Date: 09/06/16  Intake/Output from previous day: 10/18 0701 - 10/19 0700 In: 650 [P.O.:600; IV Piggyback:50] Out: -  Intake/Output this shift: No intake/output data recorded.  PE: Gen: Alert, NAD, pleasant Pulm: respiratory rate nonlabored, CTAB Abd: Soft, mildly distended, appropriately tender, +BS, incisions C/D/I  Lab Results:   Recent Labs  09/10/16 0517 09/11/16 0450  WBC 9.9 9.1  HGB 11.0* 10.9*  HCT 33.4* 33.5*  PLT 275 262   BMET  Recent Labs  09/10/16 0517 09/11/16 0450  NA 136 140  K 3.2* 3.8  CL 99* 105  CO2 28 27  GLUCOSE 114* 96  BUN <5* <5*  CREATININE 0.88 0.86  CALCIUM 8.1* 8.0*   PT/INR  Recent Labs  09/10/16 0517 09/11/16 0450  LABPROT 16.1* 25.5*  INR 1.28 2.28   CMP     Component Value Date/Time   NA 140 09/11/2016 0450   K 3.8 09/11/2016 0450   CL 105 09/11/2016 0450   CO2 27 09/11/2016 0450   GLUCOSE 96 09/11/2016 0450   BUN <5 (L) 09/11/2016 0450   CREATININE 0.86 09/11/2016 0450   CALCIUM 8.0 (L) 09/11/2016 0450   PROT 5.7 (L) 09/06/2016 0739   ALBUMIN 2.7 (L) 09/06/2016 0739   AST 19 09/06/2016 0739   ALT 12 (L) 09/06/2016 0739   ALKPHOS 57 09/06/2016 0739   BILITOT 1.0 09/06/2016 0739   GFRNONAA >60 09/11/2016 0450   GFRAA >60 09/11/2016 0450   Lipase  No results found for: LIPASE     Studies/Results: No  results found.  Anti-infectives: Anti-infectives    Start     Dose/Rate Route Frequency Ordered Stop   09/05/16 1800  piperacillin-tazobactam (ZOSYN) IVPB 3.375 g     3.375 g 12.5 mL/hr over 240 Minutes Intravenous Every 8 hours 09/05/16 1742         Assessment/Plan S/p LAPAROSCOPIC CHOLECYSTECTOMY WITH INTRAOPERATIVE CHOLANGIOGRAM10/16/17 Dr. Ezzard StandingNewman - POD 3 - tolerating diet, +flatus and BM - INR 2.28 today. Waiting for INR to reach goal of 2.5-3.5.   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   Otherwise ready for discharge from general surgery standpoint. Continue mobilization.   Will follow-up with Dr. Ezzard StandingNewman in 3 weeks.   LOS: 8 days    Edson SnowballBROOKE A Sweeney , [facility] 09/11/2016, 8:41 AM Pager: [phone] Consults: [phone] Mon-Fri 7:00 am-4:30 pm Sat-Sun 7:00 am-11:30 am  Agree with above. Doing well.  Ovidio Kinavid Janeal Abadi, MD, [facility] Pager: [phone] Office phone:  [phone]

## 2016-09-11 NOTE — Progress Notes (Signed)
PROGRESS NOTE        PATIENT DETAILS Name: [name] Age: 55 y.o. Sex: female Date of Birth: Apr 14, 1961 Admit Date: 09/03/2016 Admitting Physician Clydie Braun, MD ZOX:[id number] Armanda Heritage, MD  Brief Narrative: Patient is a 56 y.o. 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 Underwent laparoscopic cholecystectomy on 10/14, doing well postoperatively. Currently on heparin drip and overlapping Coumadin, awaiting INR to be between 2.5-3.5 prior to discharge.  Subjective: Doing well, some mild abdominal pain.  Assessment/Plan: Principal Problem:  Acute cholecystitis: Underwent laparoscopic cholecystectomy on 10/14. Doing well postoperatively, tolerating advancement in diet. No further recommendations from general surgery except for outpatient follow-up. Note, seen by cardiology for preoperative clearance-underwent nuclear stress test which was negative for ischemia. Will continue Zosyn today.  Active Problems: S/p St. Jude MVR : Continue overlapping heparin and Coumadin till INR at goal (2.5-3.5)  Chronic systolic heart failure (TTE on 10/13 EF 35-40%): Compensated, continue Coreg and enalapril.  History of CAD-s/p L main stent 2011: Noncompliant to Plavix for the past several years, continue Coreg and statin. On Coumadin for anticoagulation (mechanical mitral valve)  Anemia: Very mild, likely secondary to acute illness. Follow.  Anxiety: Currently stable, continue Klonopin.  Tobacco abuse: Counseled  DVT Prophylaxis: Full dose anticoagulation with Heparin/Coumadin  Code Status: Full code  Family Communication: None at bedside  Disposition Plan: Remain inpatient-home likely on 10/2- if INR at goal  Antimicrobial agents: See   Procedures: None  CONSULTS:  cardiology and general surgery  Time spent: 25 minutes-Greater than 50% of  this time was spent in counseling, explanation of diagnosis, planning of further management, and coordination of care.  MEDICATIONS: Anti-infectives    Start     Dose/Rate Route Frequency Ordered Stop   09/05/16 1800  piperacillin-tazobactam (ZOSYN) IVPB 3.375 g     3.375 g 12.5 mL/hr over 240 Minutes Intravenous Every 8 hours 09/05/16 1742        Scheduled Meds: . atorvastatin  20 mg Oral q1800  . carvedilol  6.25 mg Oral BID WC  . enalapril  5 mg Oral BID  . mouth rinse  15 mL Mouth Rinse BID  . piperacillin-tazobactam (ZOSYN)  IV  3.375 g Intravenous Q8H  . potassium chloride  40 mEq Oral BID  . warfarin  5 mg Oral ONCE-1800  . Warfarin - Pharmacist Dosing Inpatient   Does not apply q1800   Continuous Infusions: . heparin 1,400 Units/hr (09/11/16 1058)  . lactated ringers 10 mL/hr at 09/08/16 1019   PRN Meds:.clonazePAM, HYDROcodone-acetaminophen, ipratropium-albuterol, morphine injection, ondansetron **OR** ondansetron (ZOFRAN) IV   PHYSICAL EXAM: Vital signs: Vitals:   09/10/16 0606 09/10/16 1418 09/11/16 0512 09/11/16 1446  BP: (!) 138/49 (!) 145/62 (!) 159/67 (!) 151/68  Pulse: 62 (!) 51 (!) 55 (!) 54  Resp: 16 16 18 18   Temp: 98.4 F (36.9 C) 98.4 F (36.9 C) 98 F (36.7 C) 98.4 F (36.9 C)  TempSrc: Oral Oral Oral Oral  SpO2: 94% 93% 94% 98%  Weight:      Height:       Filed Weights   09/03/16 1942  Weight: 72.7 kg (160 lb 3.2 oz)   Body mass index is 27.5 kg/m.   General appearance :Awake, alert, not in  any distress. Speech Clear. Not toxic Looking Eyes:, pupils equally reactive to light and accomodation,no scleral icterus.Pink conjunctiva HEENT: Atraumatic and Normocephalic Neck: supple, no JVD. No cervical lymphadenopathy. No thyromegaly Resp:Good air entry bilaterally, no added sounds  CVS: S1 S2 regular, + Mechanical click GI: Bowel sounds present, Mildly but appropriately tender. No distention.  Extremities: B/L Lower Ext shows no edema, both  legs are warm to touch Neurology:  speech clear,Non focal, sensation is grossly intact. Psychiatric: Normal judgment and insight. Alert and oriented x 3. Normal mood. Musculoskeletal:gait appears to be normal.No digital cyanosis Skin:No Rash, warm and dry Wounds:N/A  I have personally reviewed following labs and imaging studies  LABORATORY DATA: CBC:  Recent Labs Lab 09/07/16 0507 09/08/16 0409 09/09/16 0529 09/10/16 0517 09/11/16 0450  WBC 9.9 7.2 8.9 9.9 9.1  HGB 11.7* 11.4* 11.3* 11.0* 10.9*  HCT 34.9* 34.3* 33.8* 33.4* 33.5*  MCV 95.4 96.6 96.8 96.5 97.4  PLT 262 255 239 275 262    Basic Metabolic Panel:  Recent Labs Lab 09/05/16 0509 09/06/16 0739 09/10/16 0517 09/11/16 0450  NA 135 138 136 140  K 3.7 3.6 3.2* 3.8  CL 101 101 99* 105  CO2 22 30 28 27   GLUCOSE 71 107* 114* 96  BUN <5* 7 <5* <5*  CREATININE 0.93 0.89 0.88 0.86  CALCIUM 8.0* 8.1* 8.1* 8.0*  MG  --   --   --  1.9    GFR: Estimated Creatinine Clearance: 73.1 mL/min (by C-G formula based on SCr of 0.86 mg/dL).  Liver Function Tests:  Recent Labs Lab 09/05/16 0509 09/06/16 0739  AST 17 19  ALT 11* 12*  ALKPHOS 56 57  BILITOT 0.9 1.0  PROT 5.7* 5.7*  ALBUMIN 2.7* 2.7*   No results for input(s): LIPASE, AMYLASE in the last 168 hours. No results for input(s): AMMONIA in the last 168 hours.  Coagulation Profile:  Recent Labs Lab 09/05/16 0509 09/06/16 0739 09/07/16 0507 09/10/16 0517 09/11/16 0450  INR 1.60 1.63 1.29 1.28 2.28    Cardiac Enzymes: No results for input(s): CKTOTAL, CKMB, CKMBINDEX, TROPONINI in the last 168 hours.  BNP (last 3 results) No results for input(s): PROBNP in the last 8760 hours.  HbA1C: No results for input(s): HGBA1C in the last 72 hours.  CBG: No results for input(s): GLUCAP in the last 168 hours.  Lipid Profile: No results for input(s): CHOL, HDL, LDLCALC, TRIG, CHOLHDL, LDLDIRECT in the last 72 hours.  Thyroid Function Tests: No  results for input(s): TSH, T4TOTAL, FREET4, T3FREE, THYROIDAB in the last 72 hours.  Anemia Panel: No results for input(s): VITAMINB12, FOLATE, FERRITIN, TIBC, IRON, RETICCTPCT in the last 72 hours.  Urine analysis:    Component Value Date/Time   COLORURINE YELLOW 09/25/2012 0538   APPEARANCEUR CLOUDY (A) 09/25/2012 0538   LABSPEC 1.024 09/25/2012 0538   PHURINE 5.0 09/25/2012 0538   GLUCOSEU NEGATIVE 09/25/2012 0538   HGBUR LARGE (A) 09/25/2012 0538   BILIRUBINUR NEGATIVE 09/25/2012 0538   KETONESUR 15 (A) 09/25/2012 0538   PROTEINUR NEGATIVE 09/25/2012 0538   UROBILINOGEN 0.2 09/25/2012 0538   NITRITE NEGATIVE 09/25/2012 0538   LEUKOCYTESUR NEGATIVE 09/25/2012 0538    Sepsis Labs: Lactic Acid, Venous No results found for: LATICACIDVEN  MICROBIOLOGY: Recent Results (from the past 240 hour(s))  Surgical pcr screen     Status: None   Collection Time: 09/03/16 10:58 PM  Result Value Ref Range Status   MRSA, PCR NEGATIVE NEGATIVE Final   Staphylococcus aureus NEGATIVE  NEGATIVE Final    Comment:        The Xpert SA Assay (FDA approved for NASAL specimens in patients over 54 years of age), is one component of a comprehensive surveillance program.  Test performance has been validated by [facility] for patients greater than or equal to 54 year old. It is not intended to diagnose infection nor to guide or monitor treatment.   Culture, blood (routine x 2)     Status: None   Collection Time: 09/05/16  7:23 PM  Result Value Ref Range Status   Specimen Description BLOOD RIGHT ANTECUBITAL  Final   Special Requests BOTTLES DRAWN AEROBIC AND ANAEROBIC 10CC  Final   Culture NO GROWTH 5 DAYS  Final   Report Status 09/10/2016 FINAL  Final  Culture, blood (routine x 2)     Status: None   Collection Time: 09/05/16  7:30 PM  Result Value Ref Range Status   Specimen Description BLOOD RIGHT HAND  Final   Special Requests BOTTLES DRAWN AEROBIC ONLY 5CC  Final   Culture NO GROWTH 5  DAYS  Final   Report Status 09/10/2016 FINAL  Final    RADIOLOGY STUDIES/RESULTS: Dg Cholangiogram Operative  Result Date: 09/08/2016 CLINICAL DATA:  Intraoperative cholangiogram. EXAM: INTRAOPERATIVE CHOLANGIOGRAM TECHNIQUE: Cholangiographic images from the C-arm fluoroscopic device were submitted for interpretation post-operatively. Please see the procedural report for the amount of contrast and the fluoroscopy time utilized. COMPARISON:  09/03/2016 FINDINGS: Common hepatic duct and common bile duct were opacified with contrast. Contrast drains into the duodenum. No obstructive filling defects or stones. IMPRESSION: Patent biliary system.  Normal intraoperative cholangiogram. Electronically Signed   By: Richarda OverlieAdam  Henn M.D.   On: 09/08/2016 12:14   Nm Myocar Multi W/spect W/wall Motion / Ef  Result Date: 09/05/2016 CLINICAL DATA:  Chest pain. Shortness of breath. Congestive heart failure. COPD. Previous myocardial infarct and stroke. Preop evaluation. EXAM: MYOCARDIAL IMAGING WITH SPECT (REST AND PHARMACOLOGIC-STRESS) GATED LEFT VENTRICULAR WALL MOTION STUDY LEFT VENTRICULAR EJECTION FRACTION TECHNIQUE: Standard myocardial SPECT imaging was performed after resting intravenous injection of 10 mCi Tc-2245m tetrofosmin. Subsequently, intravenous infusion of Lexiscan was performed under the supervision of the Cardiology staff. At peak effect of the drug, 30 mCi Tc-7345m tetrofosmin was injected intravenously and standard myocardial SPECT imaging was performed. Quantitative gated imaging was also performed to evaluate left ventricular wall motion, and estimate left ventricular ejection fraction. COMPARISON:  None. FINDINGS: Perfusion: Extensive decreased myocardial activity is seen on both the stress and resting images involving the anterior, anteroseptal, and apical walls of the left ventricle, consistent with myocardial infarct. No areas of reversibility are seen to suggest the presence of inducible myocardial  ischemia. Wall Motion: Moderate left ventricular dilatation. Global left ventricular hypokinesis. Left Ventricular Ejection Fraction: 6 % End diastolic volume 232 ml End systolic volume 217 ml IMPRESSION: 1. Extensive myocardial infarction involving the anterior, anteroseptal, and apical walls. No evidence of inducible ischemia. 2. Global left ventricular hypokinesis and left ventricular dilatation. 3. Left ventricular ejection fraction 6% 4. Non invasive risk stratification*: High *2012 Appropriate Use Criteria for Coronary Revascularization Focused Update: J Am Coll Cardiol. 2012;59(9):857-881. http://content.dementiazones.comonlinejacc.org/article.aspx?articleid=1201161 Electronically Signed   By: Myles RosenthalJohn  Stahl M.D.   On: 09/05/2016 14:01     LOS: 8 days   Jeoffrey MassedGHIMIRE,Haydin Dunn, MD  [facility] Pager:[phone]  If 7PM-7AM, please contact night-coverage [email or web address] Password [email or web address] 09/11/2016, 3:43 PM

## 2016-09-12 DIAGNOSIS — F411 Generalized anxiety disorder: Secondary | ICD-10-CM

## 2016-09-12 LAB — CBC
HCT: 35.4 % — ABNORMAL LOW (ref 36.0–46.0)
HEMOGLOBIN: 11.6 g/dL — AB (ref 12.0–15.0)
MCH: 32.1 pg (ref 26.0–34.0)
MCHC: 32.8 g/dL (ref 30.0–36.0)
MCV: 98.1 fL (ref 78.0–100.0)
Platelets: 283 10*3/uL (ref 150–400)
RBC: 3.61 MIL/uL — AB (ref 3.87–5.11)
RDW: 14 % (ref 11.5–15.5)
WBC: 9 10*3/uL (ref 4.0–10.5)

## 2016-09-12 LAB — PROTIME-INR
INR: 3.68
PROTHROMBIN TIME: 37.4 s — AB (ref 11.4–15.2)

## 2016-09-12 LAB — HEPARIN LEVEL (UNFRACTIONATED): HEPARIN UNFRACTIONATED: 0.29 [IU]/mL — AB (ref 0.30–0.70)

## 2016-09-12 MED ORDER — HYDROCODONE-ACETAMINOPHEN 5-325 MG PO TABS: 1.0000 | ORAL_TABLET | Freq: Three times a day (TID) | ORAL | 0 refills | Status: DC | PRN

## 2016-09-12 MED ORDER — CARVEDILOL 6.25 MG PO TABS: 6.2500 mg | ORAL_TABLET | Freq: Two times a day (BID) | ORAL | 0 refills | Status: AC

## 2016-09-12 MED ORDER — CARVEDILOL 6.25 MG PO TABS: 6.2500 mg | ORAL_TABLET | Freq: Two times a day (BID) | ORAL | 0 refills | Status: DC

## 2016-09-12 MED ORDER — WARFARIN SODIUM 5 MG PO TABS: ORAL_TABLET | ORAL | 2 refills | Status: AC

## 2016-09-12 NOTE — Discharge Summary (Signed)
PATIENT DETAILS Name: [name] Age: 55 y.o. Sex: female Date of Birth: Jan 24, 1961 MRN: [id number]. Admitting Physician: Clydie Braun, MD WUJ:[id number] Armanda Heritage, MD  Admit Date: 09/03/2016 Discharge date: 09/12/2016  Recommendations for Outpatient Follow-up:  1. Follow up with PCP on 10/23 for INR Check and further coumadin dosing. 2. Please obtain BMP/CBC in one week  Admitted From:  Home  Disposition: Home   Home Health: No  Equipment/Devices: None  Discharge Condition: Stable  CODE STATUS: FULL CODE  Diet recommendation:  Heart Healthy   Brief Summary: See H&P, Labs, Consult and Test reports for all details in brief, Patient is a 55 y.o. 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 Underwent laparoscopic cholecystectomy on 10/14, doing well postoperatively.   Brief Hospital Course: Acute cholecystitis: Underwent laparoscopic cholecystectomy on 10/14. Doing well postoperatively, tolerating advancement in diet. No further recommendations from general surgery except for outpatient follow-up. Note, seen by cardiology for preoperative clearance-underwent nuclear stress test which was negative for ischemia.   Active Problems: S/p St. Jude MVR: Started on overlapping heparin and Coumadin post operatively-INR on discharge 3.68, discussed with pharmacy-will hold coumadin today, give 2.5 mg tomorrow, and from 10/22-resume usual dosing of coumadin. She has been asked to go to her PCP's office on 10/23 for INR check and for further instructions regarding coumadin.   Chronic systolic heart failure (TTE on 10/13 EF 35-40%): Compensated, continue Coreg and enalapril.  History of CAD-s/p L main stent 2011: Noncompliant to Plavix for the past several years, continue Coreg and statin. On Coumadin for anticoagulation (mechanical mitral valve)  Anemia: Very mild, likely  secondary to acute illness. Follow as outpatient  Anxiety: Currently stable  Tobacco abuse: Counseled  Procedures/Studies: laparoscopic cholecystectomy on 10/14.   Discharge Diagnoses:  Principal Problem:   Acute cholecystitis Active Problems:   Chronic systolic heart failure (HCC)   MITRAL VALVE REPLACEMENT, HX OF   Nausea with vomiting   Anxiety state   Tobacco abuse   Coronary artery disease involving native coronary artery of native heart without angina pectoris   Mitral valve disease, rheumatic   CAD (coronary artery disease)   Discharge Instructions:  Activity:  As tolerated with Full fall precautions use walker/cane & assistance as needed  Discharge Instructions    Call MD for:  redness, tenderness, or signs of infection (pain, swelling, redness, odor or green/yellow discharge around incision site)    Complete by:  As directed    Diet - low sodium heart healthy    Complete by:  As directed    Discharge instructions    Complete by:  As directed    Follow with Primary MD  Ignatius Specking, MD  and and Dr Ezzard Standing (Gen Surgery)  Please get a complete blood count and chemistry panel checked by your Primary MD at your next visit, and again as instructed by your Primary MD.  Get Medicines reviewed and adjusted: Please take all your medications with you for your next visit with your Primary MD  Laboratory/radiological data: Please request your Primary MD to go over all hospital tests and procedure/radiological results at the follow up, please ask your Primary MD to get all Hospital records sent to his/her office.  In some cases, they will be blood work, cultures and biopsy results pending at the time of your discharge. Please request that your primary care M.D. follows up on these results.  Also Note the following: If you experience worsening of your admission symptoms, develop shortness of breath, life threatening emergency, suicidal or homicidal thoughts you must seek  medical attention immediately by calling 911 or calling your MD immediately  if symptoms less severe.  You must read complete instructions/literature along with all the possible adverse reactions/side effects for all the Medicines you take and that have been prescribed to you. Take any new Medicines after you have completely understood and accpet all the possible adverse reactions/side effects.   Do not drive when taking Pain medications or sleeping medications (Benzodaizepines)  Do not take more than prescribed Pain, Sleep and Anxiety Medications. It is not advisable to combine anxiety,sleep and pain medications without talking with your primary care practitioner  Special Instructions: If you have smoked or chewed Tobacco  in the last 2 yrs please stop smoking, stop any regular Alcohol  and or any Recreational drug use.  Wear Seat belts while driving.  Please note: You were cared for by a hospitalist during your hospital stay. Once you are discharged, your primary care physician will handle any further medical issues. Please note that NO REFILLS for any discharge medications will be authorized once you are discharged, as it is imperative that you return to your primary care physician (or establish a relationship with a primary care physician if you do not have one) for your post hospital discharge needs so that they can reassess your need for medications and monitor your lab values.   Increase activity slowly    Complete by:  As directed        Medication List    STOP taking these medications   traMADol 50 MG tablet Commonly known as:  ULTRAM     TAKE these medications   atorvastatin 20 MG tablet Commonly known as:  LIPITOR Take 1 tablet (20 mg total) by mouth daily at 6 PM.   carvedilol 6.25 MG tablet Commonly known as:  COREG Take 1 tablet (6.25 mg total) by mouth 2 (two) times daily with a meal.   enalapril 5 MG tablet Commonly known as:  VASOTEC Take 1 tablet (5 mg total) by  mouth 2 (two) times daily.   HYDROcodone-acetaminophen 5-325 MG tablet Commonly known as:  NORCO/VICODIN Take 1 tablet by mouth 3 (three) times daily as needed for moderate pain.   Melatonin 3 MG Caps Take 3 mg by mouth at bedtime as needed (for sleep).   NITROSTAT 0.4 MG SL tablet Generic drug:  nitroGLYCERIN DISSOLVE ONE TABLET UNDER TONGUE EVERY 5 MINUTES UP TO 3 DOSES AS NEEDED FOR CHEST PAIN What changed:  See the new instructions.   promethazine 25 MG tablet Commonly known as:  PHENERGAN Take 25 mg by mouth 2 (two) times daily as needed for nausea or vomiting.   warfarin 5 MG tablet Commonly known as:  COUMADIN Take 2.5 mg once on 10/21 Start taking your usual regimen on 10/22 (7.5 mg by mouth on Monday and Thursday. Take 10mg  by mouth on all other days) Go to Dr Bonnita Levan office on 10/23 for INR check-and ask Dr Sherril Croon for further dosing instructions What changed:  how much to take  how to take this  when to take this  additional instructions      Follow-up Information    NEWMAN,DAVID H, MD. Call today.   Specialty:  General Surgery Why:  please call as soon as you leave the hospital to make a follow-up appointment with Dr. Ezzard Standing in about 3 weeks  Contact information: [address] [place] [address] [phone]        Ignatius Specking, MD. Go on 09/15/2016.   Specialty:  Internal Medicine Why:  for INR Check and post hospital discharge visit Contact information: [address] [place] [address] [phone]          Allergies  Allergen Reactions  . Cyclobenzaprine Hcl Nausea And Vomiting  . Latex Other (See Comments)    Unknown    Consultations:   cardiology and general surgery  Other Procedures/Studies: Dg Cholangiogram Operative  Result Date: 09/08/2016 CLINICAL DATA:  Intraoperative cholangiogram. EXAM: INTRAOPERATIVE CHOLANGIOGRAM TECHNIQUE: Cholangiographic images from the C-arm fluoroscopic device were submitted for  interpretation post-operatively. Please see the procedural report for the amount of contrast and the fluoroscopy time utilized. COMPARISON:  09/03/2016 FINDINGS: Common hepatic duct and common bile duct were opacified with contrast. Contrast drains into the duodenum. No obstructive filling defects or stones. IMPRESSION: Patent biliary system.  Normal intraoperative cholangiogram. Electronically Signed   By: Richarda Overlie M.D.   On: 09/08/2016 12:14   Nm Myocar Multi W/spect W/wall Motion / Ef  Result Date: 09/05/2016 CLINICAL DATA:  Chest pain. Shortness of breath. Congestive heart failure. COPD. Previous myocardial infarct and stroke. Preop evaluation. EXAM: MYOCARDIAL IMAGING WITH SPECT (REST AND PHARMACOLOGIC-STRESS) GATED LEFT VENTRICULAR WALL MOTION STUDY LEFT VENTRICULAR EJECTION FRACTION TECHNIQUE: Standard myocardial SPECT imaging was performed after resting intravenous injection of 10 mCi Tc-16m tetrofosmin. Subsequently, intravenous infusion of Lexiscan was performed under the supervision of the Cardiology staff. At peak effect of the drug, 30 mCi Tc-36m tetrofosmin was injected intravenously and standard myocardial SPECT imaging was performed. Quantitative gated imaging was also performed to evaluate left ventricular wall motion, and estimate left ventricular ejection fraction. COMPARISON:  None. FINDINGS: Perfusion: Extensive decreased myocardial activity is seen on both the stress and resting images involving the anterior, anteroseptal, and apical walls of the left ventricle, consistent with myocardial infarct. No areas of reversibility are seen to suggest the presence of inducible myocardial ischemia. Wall Motion: Moderate left ventricular dilatation. Global left ventricular hypokinesis. Left Ventricular Ejection Fraction: 6 % End diastolic volume 232 ml End systolic volume 217 ml IMPRESSION: 1. Extensive myocardial infarction involving the anterior, anteroseptal, and apical walls. No evidence of  inducible ischemia. 2. Global left ventricular hypokinesis and left ventricular dilatation. 3. Left ventricular ejection fraction 6% 4. Non invasive risk stratification*: High *2012 Appropriate Use Criteria for Coronary Revascularization Focused Update: J Am Coll Cardiol. 2012;59(9):857-881. http://content.dementiazones.com.aspx?articleid=[email or web address] Electronically Signed   By: Myles Rosenthal M.D.   On: 09/05/2016 14:01      TODAY-DAY OF DISCHARGE:  Subjective:   Deneise Lever today has no headache,no chest abdominal pain,no new weakness tingling or numbness, feels much better wants to go home today.   Objective:   Blood pressure (!) 157/65, pulse 60, temperature 97.4 F (36.3 C), temperature source Oral, resp. rate 17, height 5\' 4"  (1.626 m), weight 72.7 kg (160 lb 3.2 oz), SpO2 96 %.  Intake/Output Summary (Last 24 hours) at 09/12/16 0938 Last data filed at 09/12/16 0600  Gross per 24 hour  Intake              898 ml  Output              850 ml  Net               48 ml   Filed Weights   09/03/16 1942  Weight:  72.7 kg (160 lb 3.2 oz)    Exam: Awake Alert, Oriented *3, No new F.N deficits, Normal affect [place].AT,PERRAL Supple Neck,No JVD, No cervical lymphadenopathy appriciated.  Symmetrical Chest wall movement, Good air movement bilaterally, CTAB RRR,No Gallops,Rubs or new Murmurs, No Parasternal Heave +ve B.Sounds, Abd Soft, Non tender, No organomegaly appriciated, No rebound -guarding or rigidity. No Cyanosis, Clubbing or edema, No new Rash or bruise   PERTINENT RADIOLOGIC STUDIES: Dg Cholangiogram Operative  Result Date: 09/08/2016 CLINICAL DATA:  Intraoperative cholangiogram. EXAM: INTRAOPERATIVE CHOLANGIOGRAM TECHNIQUE: Cholangiographic images from the C-arm fluoroscopic device were submitted for interpretation post-operatively. Please see the procedural report for the amount of contrast and the fluoroscopy time utilized. COMPARISON:  09/03/2016 FINDINGS: Common hepatic  duct and common bile duct were opacified with contrast. Contrast drains into the duodenum. No obstructive filling defects or stones. IMPRESSION: Patent biliary system.  Normal intraoperative cholangiogram. Electronically Signed   By: Richarda OverlieAdam  Henn M.D.   On: 09/08/2016 12:14   Nm Myocar Multi W/spect W/wall Motion / Ef  Result Date: 09/05/2016 CLINICAL DATA:  Chest pain. Shortness of breath. Congestive heart failure. COPD. Previous myocardial infarct and stroke. Preop evaluation. EXAM: MYOCARDIAL IMAGING WITH SPECT (REST AND PHARMACOLOGIC-STRESS) GATED LEFT VENTRICULAR WALL MOTION STUDY LEFT VENTRICULAR EJECTION FRACTION TECHNIQUE: Standard myocardial SPECT imaging was performed after resting intravenous injection of 10 mCi Tc-5671m tetrofosmin. Subsequently, intravenous infusion of Lexiscan was performed under the supervision of the Cardiology staff. At peak effect of the drug, 30 mCi Tc-3771m tetrofosmin was injected intravenously and standard myocardial SPECT imaging was performed. Quantitative gated imaging was also performed to evaluate left ventricular wall motion, and estimate left ventricular ejection fraction. COMPARISON:  None. FINDINGS: Perfusion: Extensive decreased myocardial activity is seen on both the stress and resting images involving the anterior, anteroseptal, and apical walls of the left ventricle, consistent with myocardial infarct. No areas of reversibility are seen to suggest the presence of inducible myocardial ischemia. Wall Motion: Moderate left ventricular dilatation. Global left ventricular hypokinesis. Left Ventricular Ejection Fraction: 6 % End diastolic volume 232 ml End systolic volume 217 ml IMPRESSION: 1. Extensive myocardial infarction involving the anterior, anteroseptal, and apical walls. No evidence of inducible ischemia. 2. Global left ventricular hypokinesis and left ventricular dilatation. 3. Left ventricular ejection fraction 6% 4. Non invasive risk stratification*: High  *2012 Appropriate Use Criteria for Coronary Revascularization Focused Update: J Am Coll Cardiol. 2012;59(9):857-881. http://content.dementiazones.comonlinejacc.org/article.aspx?articleid=1201161 Electronically Signed   By: Myles RosenthalJohn  Stahl M.D.   On: 09/05/2016 14:01     PERTINENT LAB RESULTS: CBC:  Recent Labs  09/11/16 0450 09/12/16 0727  WBC 9.1 9.0  HGB 10.9* 11.6*  HCT 33.5* 35.4*  PLT 262 283   CMET CMP     Component Value Date/Time   NA 140 09/11/2016 0450   K 3.8 09/11/2016 0450   CL 105 09/11/2016 0450   CO2 27 09/11/2016 0450   GLUCOSE 96 09/11/2016 0450   BUN <5 (L) 09/11/2016 0450   CREATININE 0.86 09/11/2016 0450   CALCIUM 8.0 (L) 09/11/2016 0450   PROT 5.7 (L) 09/06/2016 0739   ALBUMIN 2.7 (L) 09/06/2016 0739   AST 19 09/06/2016 0739   ALT 12 (L) 09/06/2016 0739   ALKPHOS 57 09/06/2016 0739   BILITOT 1.0 09/06/2016 0739   GFRNONAA >60 09/11/2016 0450   GFRAA >60 09/11/2016 0450    GFR Estimated Creatinine Clearance: 73.1 mL/min (by C-G formula based on SCr of 0.86 mg/dL). No results for input(s): LIPASE, AMYLASE in the last 72 hours.  No results for input(s): CKTOTAL, CKMB, CKMBINDEX, TROPONINI in the last 72 hours. Invalid input(s): POCBNP No results for input(s): DDIMER in the last 72 hours. No results for input(s): HGBA1C in the last 72 hours. No results for input(s): CHOL, HDL, LDLCALC, TRIG, CHOLHDL, LDLDIRECT in the last 72 hours. No results for input(s): TSH, T4TOTAL, T3FREE, THYROIDAB in the last 72 hours.  Invalid input(s): FREET3 No results for input(s): VITAMINB12, FOLATE, FERRITIN, TIBC, IRON, RETICCTPCT in the last 72 hours. Coags:  Recent Labs  09/11/16 0450 09/12/16 0727  INR 2.28 3.68   Microbiology: Recent Results (from the past 240 hour(s))  Surgical pcr screen     Status: None   Collection Time: 09/03/16 10:58 PM  Result Value Ref Range Status   MRSA, PCR NEGATIVE NEGATIVE Final   Staphylococcus aureus NEGATIVE NEGATIVE Final    Comment:         The Xpert SA Assay (FDA approved for NASAL specimens in patients over 6 years of age), is one component of a comprehensive surveillance program.  Test performance has been validated by [facility] for patients greater than or equal to 39 year old. It is not intended to diagnose infection nor to guide or monitor treatment.   Culture, blood (routine x 2)     Status: None   Collection Time: 09/05/16  7:23 PM  Result Value Ref Range Status   Specimen Description BLOOD RIGHT ANTECUBITAL  Final   Special Requests BOTTLES DRAWN AEROBIC AND ANAEROBIC 10CC  Final   Culture NO GROWTH 5 DAYS  Final   Report Status 09/10/2016 FINAL  Final  Culture, blood (routine x 2)     Status: None   Collection Time: 09/05/16  7:30 PM  Result Value Ref Range Status   Specimen Description BLOOD RIGHT HAND  Final   Special Requests BOTTLES DRAWN AEROBIC ONLY 5CC  Final   Culture NO GROWTH 5 DAYS  Final   Report Status 09/10/2016 FINAL  Final    FURTHER DISCHARGE INSTRUCTIONS:  Get Medicines reviewed and adjusted: Please take all your medications with you for your next visit with your Primary MD  Laboratory/radiological data: Please request your Primary MD to go over all hospital tests and procedure/radiological results at the follow up, please ask your Primary MD to get all Hospital records sent to his/her office.  In some cases, they will be blood work, cultures and biopsy results pending at the time of your discharge. Please request that your primary care M.D. goes through all the records of your hospital data and follows up on these results.  Also Note the following: If you experience worsening of your admission symptoms, develop shortness of breath, life threatening emergency, suicidal or homicidal thoughts you must seek medical attention immediately by calling 911 or calling your MD immediately  if symptoms less severe.  You must read complete instructions/literature along with all the possible  adverse reactions/side effects for all the Medicines you take and that have been prescribed to you. Take any new Medicines after you have completely understood and accpet all the possible adverse reactions/side effects.   Do not drive when taking Pain medications or sleeping medications (Benzodaizepines)  Do not take more than prescribed Pain, Sleep and Anxiety Medications. It is not advisable to combine anxiety,sleep and pain medications without talking with your primary care practitioner  Special Instructions: If you have smoked or chewed Tobacco  in the last 2 yrs please stop smoking, stop any regular Alcohol  and or any  Recreational drug use.  Wear Seat belts while driving.  Please note: You were cared for by a hospitalist during your hospital stay. Once you are discharged, your primary care physician will handle any further medical issues. Please note that NO REFILLS for any discharge medications will be authorized once you are discharged, as it is imperative that you return to your primary care physician (or establish a relationship with a primary care physician if you do not have one) for your post hospital discharge needs so that they can reassess your need for medications and monitor your lab values.  Total Time spent coordinating discharge including counseling, education and face to face time equals  45 minutes.  SignedJeoffrey Massed 09/12/2016 9:38 AM

## 2016-09-12 NOTE — Progress Notes (Signed)
Patient discharged to home with instructions. 

## 2016-09-12 NOTE — Progress Notes (Signed)
Patient ID: Brooke [name], female   DOB: July 24, 1961, 55 y.o.   MRN: [id number]  [facility] [name] [name] Progress Note  4 Days Post-Op  Subjective: Reports nausea and diarrhea over night, thinks it's related to taking PO potassium. Denies increased abdominal pain.  Objective: Vital signs in last 24 hours: Temp:  [97.4 F (36.3 C)-98.4 F (36.9 C)] 97.4 F (36.3 C) (10/20 0531) Pulse Rate:  [51-60] 60 (10/20 0531) Resp:  [17-18] 17 (10/20 0531) BP: (151-157)/(61-68) 157/65 (10/20 0531) SpO2:  [96 %-98 %] 96 % (10/20 0531) Last BM Date: 09/11/16  Intake/Output from previous day: 10/19 0701 - 10/20 0700 In: 898 [P.O.:560; I.V.:288; IV Piggyback:50] Out: 850 [Urine:850] Intake/Output this shift: No intake/output data recorded.  PE: Gen: Alert, NAD, pleasant Pulm: respiratory rate nonlabored, CTAB Abd: Soft, mildly distended, appropriately tender, +BS, incisions C/D/I  Lab Results:   Recent Labs  09/10/16 0517 09/11/16 0450  WBC 9.9 9.1  HGB 11.0* 10.9*  HCT 33.4* 33.5*  PLT 275 262   BMET  Recent Labs  09/10/16 0517 09/11/16 0450  NA 136 140  K 3.2* 3.8  CL 99* 105  CO2 28 27  GLUCOSE 114* 96  BUN <5* <5*  CREATININE 0.88 0.86  CALCIUM 8.1* 8.0*   PT/INR  Recent Labs  09/10/16 0517 09/11/16 0450  LABPROT 16.1* 25.5*  INR 1.28 2.28   CMP     Component Value Date/Time   NA 140 09/11/2016 0450   K 3.8 09/11/2016 0450   CL 105 09/11/2016 0450   CO2 27 09/11/2016 0450   GLUCOSE 96 09/11/2016 0450   BUN <5 (L) 09/11/2016 0450   CREATININE 0.86 09/11/2016 0450   CALCIUM 8.0 (L) 09/11/2016 0450   PROT 5.7 (L) 09/06/2016 0739   ALBUMIN 2.7 (L) 09/06/2016 0739   AST 19 09/06/2016 0739   ALT 12 (L) 09/06/2016 0739   ALKPHOS 57 09/06/2016 0739   BILITOT 1.0 09/06/2016 0739   GFRNONAA >60 09/11/2016 0450   GFRAA >60 09/11/2016 0450   Lipase  No results found for: LIPASE     Studies/Results: No results  found.  Anti-infectives: Anti-infectives    Start     Dose/Rate Route Frequency Ordered Stop   09/05/16 1800  piperacillin-tazobactam (ZOSYN) IVPB 3.375 g  Status:  Discontinued     3.375 g 12.5 mL/hr over 240 Minutes Intravenous Every 8 hours 09/05/16 1742 09/12/16 0739       Assessment/Plan S/p LAPAROSCOPIC CHOLECYSTECTOMY WITH INTRAOPERATIVE CHOLANGIOGRAM10/16/17 Dr. Ezzard [name] - POD 4 - some nausea/diarrhea over night likely related to PO potassium. Otherwise doing well with no increased abdominal pain - INR 3.68 today.  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 K pending this morning.             Home once INR reaches 2.5-3.5 per medicine.              Otherwise ready for discharge from general [name] standpoint. Continue mobilization.              Will follow-up with Dr. Ezzard [name] in 3 weeks.   LOS: 9 days    Brooke [name] , [facility] [name] 09/12/2016, 8:32 AM Pager: [phone] Consults: [phone] Mon-Fri 7:00 am-4:30 pm Sat-Sun 7:00 am-11:30 am   Agree with above. Getting  better.  Brooke Kinavid Eloyse Causey, MD, [facility] Brooke [name] Pager: [phone] Office phone:  [phone]

## 2016-09-15 ENCOUNTER — Inpatient Hospital Stay (HOSPITAL_COMMUNITY)
Admission: EM | Admit: 2016-09-15 | Discharge: 2016-10-16 | DRG: 393 | Disposition: A | Discharge status: Home / Self Care | Payer: Medicaid Other | Attending: Surgery | Admitting: Surgery

## 2016-09-15 ENCOUNTER — Encounter (HOSPITAL_COMMUNITY): Payer: Self-pay | Admitting: Emergency Medicine

## 2016-09-15 DIAGNOSIS — K922 Gastrointestinal hemorrhage, unspecified: Secondary | ICD-10-CM | POA: Diagnosis present

## 2016-09-15 DIAGNOSIS — Z79899 Other long term (current) drug therapy: Secondary | ICD-10-CM

## 2016-09-15 DIAGNOSIS — I5022 Chronic systolic (congestive) heart failure: Secondary | ICD-10-CM | POA: Diagnosis present

## 2016-09-15 DIAGNOSIS — I774 Celiac artery compression syndrome: Secondary | ICD-10-CM | POA: Diagnosis present

## 2016-09-15 DIAGNOSIS — K529 Noninfective gastroenteritis and colitis, unspecified: Secondary | ICD-10-CM | POA: Diagnosis not present

## 2016-09-15 DIAGNOSIS — D6832 Hemorrhagic disorder due to extrinsic circulating anticoagulants: Secondary | ICD-10-CM | POA: Diagnosis present

## 2016-09-15 DIAGNOSIS — E782 Mixed hyperlipidemia: Secondary | ICD-10-CM | POA: Diagnosis present

## 2016-09-15 DIAGNOSIS — N179 Acute kidney failure, unspecified: Secondary | ICD-10-CM | POA: Diagnosis present

## 2016-09-15 DIAGNOSIS — I252 Old myocardial infarction: Secondary | ICD-10-CM

## 2016-09-15 DIAGNOSIS — F410 Panic disorder [episodic paroxysmal anxiety] without agoraphobia: Secondary | ICD-10-CM | POA: Diagnosis present

## 2016-09-15 DIAGNOSIS — Z952 Presence of prosthetic heart valve: Secondary | ICD-10-CM

## 2016-09-15 DIAGNOSIS — R197 Diarrhea, unspecified: Secondary | ICD-10-CM | POA: Diagnosis not present

## 2016-09-15 DIAGNOSIS — Z7901 Long term (current) use of anticoagulants: Secondary | ICD-10-CM | POA: Diagnosis not present

## 2016-09-15 DIAGNOSIS — I771 Stricture of artery: Secondary | ICD-10-CM | POA: Diagnosis present

## 2016-09-15 DIAGNOSIS — F17210 Nicotine dependence, cigarettes, uncomplicated: Secondary | ICD-10-CM | POA: Diagnosis present

## 2016-09-15 DIAGNOSIS — D72829 Elevated white blood cell count, unspecified: Secondary | ICD-10-CM | POA: Diagnosis not present

## 2016-09-15 DIAGNOSIS — R188 Other ascites: Secondary | ICD-10-CM | POA: Diagnosis present

## 2016-09-15 DIAGNOSIS — K810 Acute cholecystitis: Secondary | ICD-10-CM | POA: Diagnosis present

## 2016-09-15 DIAGNOSIS — Z955 Presence of coronary angioplasty implant and graft: Secondary | ICD-10-CM | POA: Diagnosis not present

## 2016-09-15 DIAGNOSIS — K55059 Acute (reversible) ischemia of intestine, part and extent unspecified: Secondary | ICD-10-CM | POA: Diagnosis not present

## 2016-09-15 DIAGNOSIS — I429 Cardiomyopathy, unspecified: Secondary | ICD-10-CM | POA: Diagnosis present

## 2016-09-15 DIAGNOSIS — R109 Unspecified abdominal pain: Secondary | ICD-10-CM | POA: Diagnosis present

## 2016-09-15 DIAGNOSIS — D649 Anemia, unspecified: Secondary | ICD-10-CM | POA: Diagnosis present

## 2016-09-15 DIAGNOSIS — T45515A Adverse effect of anticoagulants, initial encounter: Secondary | ICD-10-CM | POA: Diagnosis present

## 2016-09-15 DIAGNOSIS — K55069 Acute infarction of intestine, part and extent unspecified: Secondary | ICD-10-CM | POA: Diagnosis present

## 2016-09-15 DIAGNOSIS — G8929 Other chronic pain: Secondary | ICD-10-CM | POA: Diagnosis present

## 2016-09-15 DIAGNOSIS — B999 Unspecified infectious disease: Secondary | ICD-10-CM

## 2016-09-15 DIAGNOSIS — M797 Fibromyalgia: Secondary | ICD-10-CM | POA: Diagnosis present

## 2016-09-15 DIAGNOSIS — R110 Nausea: Secondary | ICD-10-CM

## 2016-09-15 DIAGNOSIS — I2510 Atherosclerotic heart disease of native coronary artery without angina pectoris: Secondary | ICD-10-CM | POA: Diagnosis present

## 2016-09-15 DIAGNOSIS — F411 Generalized anxiety disorder: Secondary | ICD-10-CM | POA: Diagnosis present

## 2016-09-15 DIAGNOSIS — M549 Dorsalgia, unspecified: Secondary | ICD-10-CM | POA: Diagnosis not present

## 2016-09-15 DIAGNOSIS — I110 Hypertensive heart disease with heart failure: Secondary | ICD-10-CM | POA: Diagnosis present

## 2016-09-15 DIAGNOSIS — R7303 Prediabetes: Secondary | ICD-10-CM | POA: Diagnosis present

## 2016-09-15 DIAGNOSIS — Z8249 Family history of ischemic heart disease and other diseases of the circulatory system: Secondary | ICD-10-CM

## 2016-09-15 DIAGNOSIS — R1011 Right upper quadrant pain: Secondary | ICD-10-CM

## 2016-09-15 DIAGNOSIS — J449 Chronic obstructive pulmonary disease, unspecified: Secondary | ICD-10-CM | POA: Diagnosis present

## 2016-09-15 DIAGNOSIS — B9681 Helicobacter pylori [H. pylori] as the cause of diseases classified elsewhere: Secondary | ICD-10-CM | POA: Diagnosis present

## 2016-09-15 DIAGNOSIS — R638 Other symptoms and signs concerning food and fluid intake: Secondary | ICD-10-CM | POA: Diagnosis not present

## 2016-09-15 DIAGNOSIS — E871 Hypo-osmolality and hyponatremia: Secondary | ICD-10-CM | POA: Diagnosis present

## 2016-09-15 DIAGNOSIS — Z8673 Personal history of transient ischemic attack (TIA), and cerebral infarction without residual deficits: Secondary | ICD-10-CM | POA: Diagnosis not present

## 2016-09-15 DIAGNOSIS — K2980 Duodenitis without bleeding: Secondary | ICD-10-CM | POA: Diagnosis present

## 2016-09-15 DIAGNOSIS — R1013 Epigastric pain: Secondary | ICD-10-CM | POA: Diagnosis not present

## 2016-09-15 DIAGNOSIS — K559 Vascular disorder of intestine, unspecified: Secondary | ICD-10-CM | POA: Diagnosis present

## 2016-09-15 DIAGNOSIS — F329 Major depressive disorder, single episode, unspecified: Secondary | ICD-10-CM | POA: Diagnosis present

## 2016-09-15 DIAGNOSIS — R112 Nausea with vomiting, unspecified: Secondary | ICD-10-CM | POA: Diagnosis present

## 2016-09-15 DIAGNOSIS — E876 Hypokalemia: Secondary | ICD-10-CM | POA: Diagnosis present

## 2016-09-15 DIAGNOSIS — Z9049 Acquired absence of other specified parts of digestive tract: Secondary | ICD-10-CM

## 2016-09-15 DIAGNOSIS — M1990 Unspecified osteoarthritis, unspecified site: Secondary | ICD-10-CM | POA: Diagnosis present

## 2016-09-15 DIAGNOSIS — R509 Fever, unspecified: Secondary | ICD-10-CM | POA: Diagnosis not present

## 2016-09-15 DIAGNOSIS — Z79891 Long term (current) use of opiate analgesic: Secondary | ICD-10-CM

## 2016-09-15 DIAGNOSIS — E860 Dehydration: Secondary | ICD-10-CM | POA: Diagnosis present

## 2016-09-15 DIAGNOSIS — Z9889 Other specified postprocedural states: Secondary | ICD-10-CM

## 2016-09-15 DIAGNOSIS — K210 Gastro-esophageal reflux disease with esophagitis: Secondary | ICD-10-CM | POA: Diagnosis present

## 2016-09-15 DIAGNOSIS — K5730 Diverticulosis of large intestine without perforation or abscess without bleeding: Secondary | ICD-10-CM | POA: Diagnosis present

## 2016-09-15 LAB — CBC
HCT: 44.2 % (ref 36.0–46.0)
Hemoglobin: 14.8 g/dL (ref 12.0–15.0)
MCH: 32.9 pg (ref 26.0–34.0)
MCHC: 33.5 g/dL (ref 30.0–36.0)
MCV: 98.2 fL (ref 78.0–100.0)
Platelets: 369 10*3/uL (ref 150–400)
RBC: 4.5 MIL/uL (ref 3.87–5.11)
RDW: 14.8 % (ref 11.5–15.5)
WBC: 11.9 10*3/uL — ABNORMAL HIGH (ref 4.0–10.5)

## 2016-09-15 LAB — COMPREHENSIVE METABOLIC PANEL
ALT: 20 U/L (ref 14–54)
AST: 22 U/L (ref 15–41)
Albumin: 3.1 g/dL — ABNORMAL LOW (ref 3.5–5.0)
Alkaline Phosphatase: 119 U/L (ref 38–126)
Anion gap: 9 (ref 5–15)
BILIRUBIN TOTAL: 0.6 mg/dL (ref 0.3–1.2)
BUN: 7 mg/dL (ref 6–20)
CO2: 25 mmol/L (ref 22–32)
CREATININE: 0.9 mg/dL (ref 0.44–1.00)
Calcium: 9 mg/dL (ref 8.9–10.3)
Chloride: 107 mmol/L (ref 101–111)
GFR calc Af Amer: >60 mL/min (ref >60)
Glucose, Bld: 102 mg/dL — ABNORMAL HIGH (ref 65–99)
POTASSIUM: 4.2 mmol/L (ref 3.5–5.1)
Sodium: 141 mmol/L (ref 135–145)
TOTAL PROTEIN: 7.1 g/dL (ref 6.5–8.1)

## 2016-09-15 LAB — PROTIME-INR
INR: 3.08
Prothrombin Time: 32.5 seconds — ABNORMAL HIGH (ref 11.4–15.2)

## 2016-09-15 LAB — APTT: APTT: 51 s — AB (ref 24–36)

## 2016-09-15 MED ORDER — ATORVASTATIN CALCIUM 20 MG PO TABS
20.0000 mg | ORAL_TABLET | Freq: Every day | ORAL | Status: DC
  Administered 2016-09-16 – 2016-09-22 (×7): 20 mg via ORAL
  Filled 2016-09-15 (×7): qty 1

## 2016-09-15 MED ORDER — CARVEDILOL 6.25 MG PO TABS
6.2500 mg | ORAL_TABLET | Freq: Two times a day (BID) | ORAL | Status: DC
  Administered 2016-09-16 – 2016-09-25 (×18): 6.25 mg via ORAL
  Filled 2016-09-15 (×19): qty 1

## 2016-09-15 MED ORDER — KCL IN DEXTROSE-NACL 20-5-0.9 MEQ/L-%-% IV SOLN
INTRAVENOUS | Status: DC
  Administered 2016-09-16 – 2016-09-21 (×7): via INTRAVENOUS
  Filled 2016-09-15 (×13): qty 1000

## 2016-09-15 MED ORDER — NITROGLYCERIN 0.4 MG SL SUBL: 0.4000 mg | SUBLINGUAL_TABLET | SUBLINGUAL | Status: DC | PRN

## 2016-09-15 MED ORDER — CIPROFLOXACIN IN D5W 400 MG/200ML IV SOLN
400.0000 mg | Freq: Two times a day (BID) | INTRAVENOUS | Status: DC
  Administered 2016-09-15 – 2016-09-17 (×4): 400 mg via INTRAVENOUS
  Filled 2016-09-15 (×5): qty 200

## 2016-09-15 MED ORDER — ONDANSETRON HCL 4 MG/2ML IJ SOLN
4.0000 mg | Freq: Four times a day (QID) | INTRAMUSCULAR | Status: DC | PRN
  Administered 2016-09-15 – 2016-09-24 (×23): 4 mg via INTRAVENOUS
  Filled 2016-09-15 (×24): qty 2

## 2016-09-15 MED ORDER — ZOLPIDEM TARTRATE 5 MG PO TABS
5.0000 mg | ORAL_TABLET | Freq: Every evening | ORAL | Status: DC | PRN
  Administered 2016-09-23 – 2016-10-15 (×10): 5 mg via ORAL
  Filled 2016-09-15 (×11): qty 1

## 2016-09-15 MED ORDER — HYDROMORPHONE HCL 2 MG/ML IJ SOLN
1.0000 mg | INTRAMUSCULAR | Status: DC | PRN
  Administered 2016-09-15 – 2016-09-22 (×51): 1 mg via INTRAVENOUS
  Filled 2016-09-15 (×52): qty 1

## 2016-09-15 MED ORDER — ONDANSETRON 4 MG PO TBDP
4.0000 mg | ORAL_TABLET | Freq: Four times a day (QID) | ORAL | Status: DC | PRN
  Administered 2016-09-23: 4 mg via ORAL
  Filled 2016-09-15 (×3): qty 1

## 2016-09-15 MED ORDER — ENALAPRIL MALEATE 5 MG PO TABS
5.0000 mg | ORAL_TABLET | Freq: Two times a day (BID) | ORAL | Status: DC
  Administered 2016-09-16 – 2016-10-16 (×51): 5 mg via ORAL
  Filled 2016-09-15 (×60): qty 1

## 2016-09-15 NOTE — ED Provider Notes (Signed)
[facility]-EM[facility]ENCY DEPT Provider Note   CSN: [id number] Arrival date & time: 09/15/16  2118     History   Chief Complaint Chief Complaint  Patient presents with  . Abdominal Pain    HPI [name] is a 55 y.o. female.  Patient is a 55 year old female with history of recent laparoscopic cholecystectomy performed by Dr. Ezzard Standing. She was sent here from [facility] for evaluation of possible surgical complication. She reports nausea and vomiting for the past 2 days and has been unable to keep anything down. She is also reporting upper abdominal pain. She denies any fevers or chills.  She was seen at [facility] and had a CT scan which shows possible bile leak versus infection. She was transferred here to be evaluated by Dr. Luisa Hart from general surgery.      Past Medical History:  Diagnosis Date  . Anxiety   . Arthritis    "qwhere" (09/03/2016)  . Asthma   . CHF (congestive heart failure) (HCC)   . Chronic back pain   . COPD (chronic obstructive pulmonary disease) (HCC)   . Coronary artery disease   . Coronary disease    Minimal nonobstructive by cardiac catheterization prior to her surgery in 2007, ejection fraction of 55% at that time.  . Daily headache    "daily but really bad @ least 2/wk" (09/03/2016)  . Fibromyalgia   . History of CHF (congestive heart failure)    Ejection fraction 45% not candidate for ICD.  Marland Kitchen History of high cholesterol   . Hypertension   . Migraine    "@ least 2/wk" (09/03/2016)  . Myocardial infarction, anterior wall (HCC)     late reperfusion occluded LAD treated with a bare-metal stent complicated by catheter-induced left mainstem dissection and also treated with a bare-metal stent., initial LV function 25-30% followup echocardiogram ejection fraction 45%   . Panic attacks   . Rheumatic heart disease    s/p St. Jude mitral valve in 2007  . Shortness of breath   . Spina bifida (HCC)   . Stroke [facility]) 09/24/2012   TIA; "issues  w/my memory since" (09/03/2016)    Patient Active Problem List   Diagnosis Date Noted  . CAD (coronary artery disease)   . Nausea with vomiting 09/04/2016  . Anxiety state 09/04/2016  . Tobacco abuse 09/04/2016  . Coronary artery disease involving native coronary artery of native heart without angina pectoris   . Mitral valve disease, rheumatic   . Acute cholecystitis 09/03/2016  . CVA (cerebral infarction) 09/27/2012  . 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 Leukocytosis 09/24/2012  . Blood glucose elevated 09/24/2012  . Back pain   . Myocardial infarction, anterior wall (HCC)   . HYPERLIPIDEMIA, MIXED 08/27/2010  . CAD 08/27/2010  . Chronic systolic heart failure (HCC) 08/27/2010  . MITRAL VALVE REPLACEMENT, HX OF 08/27/2010    Past Surgical History:  Procedure Laterality Date  . APPENDECTOMY    . CARDIAC CATHETERIZATION  06/2005; 11/2005   Hattie Perch 04/08/2011   . CARDIAC VALVE REPLACEMENT  02/2006   St. Jude MVR  . CHOLECYSTECTOMY N/A 09/08/2016   Procedure: LAPAROSCOPIC CHOLECYSTECTOMY WITH INTRAOPERATIVE CHOLANGIOGRAM;  Surgeon: Ovidio Kin, MD;  Location: [facility] OR;  Service: General;  Laterality: N/A;  . CORONARY ANGIOPLASTY WITH STENT PLACEMENT  06/2010   BMS to the mLAD (culprit vessel) and L main (which dissected during the procedure), non-obs dz CFX & RCA  . MEDIAN STERNOTOMY  02/2006   for mitral valve replacement (25  mm St. Jude mechanical prosthesis.)  . TUBAL LIGATION      OB History    No data available       Home Medications    Prior to Admission medications   Medication Sig Start Date End Date Taking? Authorizing Provider  atorvastatin (LIPITOR) 20 MG tablet Take 1 tablet (20 mg total) by mouth daily at 6 PM. Patient not taking: Reported on 09/04/2016 09/29/12   Meredeth Ide, MD  carvedilol (COREG) 6.25 MG tablet Take 1 tablet (6.25 mg total) by mouth 2 (two) times daily with a meal. 09/12/16   Maretta Bees, MD  enalapril (VASOTEC) 5 MG  tablet Take 1 tablet (5 mg total) by mouth 2 (two) times daily. Patient not taking: Reported on 09/04/2016 09/12/11   June Leap, MD  HYDROcodone-acetaminophen (NORCO/VICODIN) 5-325 MG tablet Take 1 tablet by mouth 3 (three) times daily as needed for moderate pain. 09/12/16   Shanker Levora Dredge, MD  Melatonin 3 MG CAPS Take 3 mg by mouth at bedtime as needed (for sleep).    Historical Provider, MD  NITROSTAT 0.4 MG SL tablet DISSOLVE ONE TABLET UNDER TONGUE EVERY 5 MINUTES UP TO 3 DOSES AS NEEDED FOR CHEST PAIN Patient taking differently: DISSOLVE 0.4 MG UNDER TONGUE EVERY 5 MINUTES UP TO 3 DOSES AS NEEDED FOR CHEST PAIN 03/22/15   Jonelle Sidle, MD  promethazine (PHENERGAN) 25 MG tablet Take 25 mg by mouth 2 (two) times daily as needed for nausea or vomiting.  02/26/12   Historical Provider, MD  warfarin (COUMADIN) 5 MG tablet Take 2.5 mg once on 10/21 Start taking your usual regimen on 10/22 (7.5 mg by mouth on Monday and Thursday. Take 10mg  by mouth on all other days) Go to Dr Bonnita Levan office on 10/23 for INR check-and ask Dr Sherril Croon for further dosing instructions 09/12/16   Maretta Bees, MD    Family History Family History  Problem Relation Age of Onset  . Heart attack Father 63  . Heart attack Mother 48  . Coronary artery disease Sister     Social History Social History  Substance Use Topics  . Smoking status: Current Every Day Smoker    Packs/day: 0.25    Years: 38.00    Types: Cigarettes  . Smokeless tobacco: Never Used  . Alcohol use Yes     Comment: 09/03/2016 "drank some; quit after my 1st child was born in 1987"     Allergies   Cyclobenzaprine hcl and Latex   Review of Systems Review of Systems  All other systems reviewed and are negative.    Physical Exam Updated Vital Signs BP 126/59   Pulse 83   Temp 98.5 F (36.9 C) (Oral)   Resp 20   SpO2 95%   Physical Exam  Constitutional: She is oriented to person, place, and time. She appears well-developed  and well-nourished. No distress.  HENT:  Head: Normocephalic and atraumatic.  Neck: Normal range of motion. Neck supple.  Cardiovascular: Normal rate and regular rhythm.  Exam reveals no gallop and no friction rub.   No murmur heard. Pulmonary/Chest: Effort normal and breath sounds normal. No respiratory distress. She has no wheezes.  Abdominal: Soft. Bowel sounds are normal. She exhibits no distension. There is tenderness.  There is tenderness to palpation across the upper abdomen. There is no rebound and no guarding.  Musculoskeletal: Normal range of motion.  Neurological: She is alert and oriented to person, place, and time.  Skin:  Skin is warm and dry. She is not diaphoretic.  Nursing note and vitals reviewed.    ED Treatments / Results  Labs (all labs ordered are listed, but only abnormal results are displayed) Labs Reviewed - No data to display  EKG  EKG Interpretation None       Radiology No results found.  Procedures Procedures (including critical care time)  Medications Ordered in ED Medications - No data to display   Initial Impression / Assessment and Plan / ED Course  I have reviewed the triage vital signs and the nursing notes.  Pertinent labs & imaging results that were available during my care of the patient were reviewed by me and considered in my medical decision making (see chart for details).  Clinical Course    Patient to be evaluated by general surgery who will determine the final disposition.  Final Clinical Impressions(s) / ED Diagnoses   Final diagnoses:  None    New Prescriptions New Prescriptions   No medications on file     Geoffery Lyonsouglas Wafa Martes, MD 09/15/16 2353

## 2016-09-15 NOTE — H&P (Signed)
[name] is an 56 y.o. female.   Chief Complaint: Abdominal pain  HPI: Patient is transferred from [facility] secondary to abdominal pain. She is one-week status post laparoscopic cholecystectomy by Dr. Ovidio Kin. She went home on Friday and felt well until about 2:30 this morning she developed epigastric and right upper quadrant abdominal pain. She also has nausea and vomiting. The pain is constant and made worse with movement. Location is right upper quadrant of abdomen. There is no radiation to her back. She was seen at [facility] where CT scan showed a possible biloma in the gallbladder bed. There is no significant free fluid. Her white count was 16,000 her liver function studies were within normal limits. Transfer was requested since she had her surgery done here. Of note she is on Coumadin for a cardiac valve. Denies any shortness of breath or chest pain. She is currently comfortable in the emergency room after transfer. Her vital signs are stable.  Past Medical History:  Diagnosis Date  . Anxiety   . Arthritis    "qwhere" (09/03/2016)  . Asthma   . CHF (congestive heart failure) (HCC)   . Chronic back pain   . COPD (chronic obstructive pulmonary disease) (HCC)   . Coronary artery disease   . Coronary disease    Minimal nonobstructive by cardiac catheterization prior to her surgery in 2007, ejection fraction of 55% at that time.  . Daily headache    "daily but really bad @ least 2/wk" (09/03/2016)  . Fibromyalgia   . History of CHF (congestive heart failure)    Ejection fraction 45% not candidate for ICD.  Marland Kitchen History of high cholesterol   . Hypertension   . Migraine    "@ least 2/wk" (09/03/2016)  . Myocardial infarction, anterior wall (HCC)     late reperfusion occluded LAD treated with a bare-metal stent complicated by catheter-induced left mainstem dissection and also treated with a bare-metal stent., initial LV function 25-30% followup echocardiogram ejection  fraction 45%   . Panic attacks   . Rheumatic heart disease    s/p St. Jude mitral valve in 2007  . Shortness of breath   . Spina bifida (HCC)   . Stroke [facility]) 09/24/2012   TIA; "issues w/my memory since" (09/03/2016)    Past Surgical History:  Procedure Laterality Date  . APPENDECTOMY    . CARDIAC CATHETERIZATION  06/2005; 11/2005   Hattie Perch 04/08/2011   . CARDIAC VALVE REPLACEMENT  02/2006   St. Jude MVR  . CHOLECYSTECTOMY N/A 09/08/2016   Procedure: LAPAROSCOPIC CHOLECYSTECTOMY WITH INTRAOPERATIVE CHOLANGIOGRAM;  Surgeon: Ovidio Kin, MD;  Location: [facility] OR;  Service: General;  Laterality: N/A;  . CORONARY ANGIOPLASTY WITH STENT PLACEMENT  06/2010   BMS to the mLAD (culprit vessel) and L main (which dissected during the procedure), non-obs dz CFX & RCA  . MEDIAN STERNOTOMY  02/2006   for mitral valve replacement (25 mm St. Jude mechanical prosthesis.)  . TUBAL LIGATION      Family History  Problem Relation Age of Onset  . Heart attack Father 11  . Heart attack Mother 29  . Coronary artery disease Sister    Social History:  reports that she has been smoking Cigarettes.  She has a 9.50 pack-year smoking history. She has never used smokeless tobacco. She reports that she drinks alcohol. She reports that she does not use drugs.  Allergies:  Allergies  Allergen Reactions  . Cyclobenzaprine Hcl Nausea And Vomiting  . Latex Other (See  Comments)    Unknown     (Not in a hospital admission)  No results found for this or any previous visit (from the past 48 hour(s)). No results found.  Review of Systems  Constitutional: Positive for malaise/fatigue.  Respiratory: Negative for shortness of breath.   Cardiovascular: Negative for chest pain.  Gastrointestinal: Positive for abdominal pain and nausea.  Skin: Negative for rash.  Neurological: Positive for headaches. Negative for dizziness.  Endo/Heme/Allergies: Bruises/bleeds easily.    Blood pressure 126/59, pulse 83,  temperature 98.5 F (36.9 C), temperature source Oral, resp. rate 20, SpO2 95 %. Physical Exam  Constitutional: She is oriented to person, place, and time. She appears well-developed and well-nourished. No distress.  HENT:  Head: Normocephalic and atraumatic.  Eyes: Pupils are equal, round, and reactive to light. No scleral icterus.  Neck: Normal range of motion.  Cardiovascular: Normal rate and regular rhythm.   Mechanical valve  Respiratory: Effort normal and breath sounds normal.  GI: Soft. She exhibits mass. She exhibits no distension. There is tenderness. There is guarding. There is no rebound.  Port sites clean dry and intact.  Musculoskeletal: Normal range of motion.  Neurological: She is alert and oriented to person, place, and time.  Skin: Skin is warm and dry.     Assessment/Plan Abdominal pain status post laparoscopic cholecystectomy one week ago  Admit for IV fluids, IV antibiotics and arrange for HIDA  study in a.m.  Placed on heparin drip for now indication needs percutaneous drainage  Clear liquids until midnight and nothing by mouth after midnight  Cipro 400 mg IV  Patient Active Problem List   Diagnosis Date Noted  . Abdominal pain 09/15/2016  . CAD (coronary artery disease)   . Nausea with vomiting 09/04/2016  . Anxiety state 09/04/2016  . Tobacco abuse 09/04/2016  . Coronary artery disease involving native coronary artery of native heart without angina pectoris   . Mitral valve disease, rheumatic   . Acute cholecystitis 09/03/2016  . CVA (cerebral infarction) 09/27/2012  . 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 Leukocytosis 09/24/2012  . Blood glucose elevated 09/24/2012  . Back pain   . Myocardial infarction, anterior wall (HCC)   . HYPERLIPIDEMIA, MIXED 08/27/2010  . CAD 08/27/2010  . Chronic systolic heart failure (HCC) 08/27/2010  . MITRAL VALVE REPLACEMENT, HX OF 08/27/2010    Modesty Rudy A., MD 09/15/2016, 10:15 PM

## 2016-09-15 NOTE — ED Triage Notes (Signed)
Per [facility], pt from [facility] with possible bowel perforation or surgical infection s/p gallbladder removal. Pt A7O x 4 upon arrival to this ED.

## 2016-09-16 ENCOUNTER — Inpatient Hospital Stay (HOSPITAL_COMMUNITY): Payer: Medicaid Other

## 2016-09-16 LAB — URINALYSIS, ROUTINE W REFLEX MICROSCOPIC
Bilirubin Urine: NEGATIVE
GLUCOSE, UA: NEGATIVE mg/dL
Ketones, ur: NEGATIVE mg/dL
Leukocytes, UA: NEGATIVE
Nitrite: NEGATIVE
PH: 5 (ref 5.0–8.0)
Protein, ur: NEGATIVE mg/dL
Specific Gravity, Urine: 1.008 (ref 1.005–1.030)

## 2016-09-16 LAB — URINE MICROSCOPIC-ADD ON

## 2016-09-16 LAB — COMPREHENSIVE METABOLIC PANEL
ALBUMIN: 3 g/dL — AB (ref 3.5–5.0)
ALK PHOS: 108 U/L (ref 38–126)
ALT: 17 U/L (ref 14–54)
AST: 30 U/L (ref 15–41)
Anion gap: 8 (ref 5–15)
BUN: 7 mg/dL (ref 6–20)
CALCIUM: 8.7 mg/dL — AB (ref 8.9–10.3)
CO2: 25 mmol/L (ref 22–32)
CREATININE: 0.88 mg/dL (ref 0.44–1.00)
Chloride: 104 mmol/L (ref 101–111)
GFR calc Af Amer: >60 mL/min (ref >60)
GFR calc non Af Amer: >60 mL/min (ref >60)
GLUCOSE: 138 mg/dL — AB (ref 65–99)
Potassium: 3.7 mmol/L (ref 3.5–5.1)
SODIUM: 137 mmol/L (ref 135–145)
Total Bilirubin: 0.6 mg/dL (ref 0.3–1.2)
Total Protein: 6.5 g/dL (ref 6.5–8.1)

## 2016-09-16 LAB — PROTIME-INR
INR: 3.31
Prothrombin Time: 34.4 seconds — ABNORMAL HIGH (ref 11.4–15.2)

## 2016-09-16 MED ORDER — TECHNETIUM TC 99M MEBROFENIN IV KIT
5.2000 | PACK | Freq: Once | INTRAVENOUS | Status: AC | PRN
  Administered 2016-09-16: 5.2 via INTRAVENOUS

## 2016-09-16 MED ORDER — ALPRAZOLAM 0.5 MG PO TABS
0.5000 mg | ORAL_TABLET | Freq: Every evening | ORAL | Status: DC | PRN
  Administered 2016-09-16 – 2016-10-08 (×23): 0.5 mg via ORAL
  Filled 2016-09-16 (×23): qty 1

## 2016-09-16 MED ORDER — PROMETHAZINE HCL 25 MG/ML IJ SOLN
6.2500 mg | Freq: Four times a day (QID) | INTRAMUSCULAR | Status: DC | PRN
  Administered 2016-09-16 – 2016-09-22 (×18): 6.25 mg via INTRAVENOUS
  Filled 2016-09-16 (×19): qty 1

## 2016-09-16 NOTE — Progress Notes (Signed)
ANTICOAGULATION CONSULT NOTE - Initial Consult  Pharmacy Consult for heparin Indication: MVR  Allergies  Allergen Reactions  . Cyclobenzaprine Hcl Nausea And Vomiting  . Latex Other (See Comments)    Unknown    Patient Measurements: Heparin Dosing Weight: 70kg  Vital Signs: Temp: 98.6 F (37 C) (10/23 2340) Temp Source: Oral (10/23 2340) BP: 131/70 (10/23 2340) Pulse Rate: 85 (10/23 2340)  Labs:  Recent Labs  09/15/16 2224  HGB 14.8  HCT 44.2  PLT 369  APTT 51*  LABPROT 32.5*  INR 3.08  CREATININE 0.90    Estimated Creatinine Clearance: 69.8 mL/min (by C-G formula based on SCr of 0.9 mg/dL).   Medical History: Past Medical History:  Diagnosis Date  . Anxiety   . Arthritis    "qwhere" (09/03/2016)  . Asthma   . CHF (congestive heart failure) (HCC)   . Chronic back pain   . COPD (chronic obstructive pulmonary disease) (HCC)   . Coronary artery disease   . Coronary disease    Minimal nonobstructive by cardiac catheterization prior to her surgery in 2007, ejection fraction of 55% at that time.  . Daily headache    "daily but really bad @ least 2/wk" (09/03/2016)  . Fibromyalgia   . History of CHF (congestive heart failure)    Ejection fraction 45% not candidate for ICD.  Marland Kitchen. History of high cholesterol   . Hypertension   . Migraine    "@ least 2/wk" (09/03/2016)  . Myocardial infarction, anterior wall (HCC)     late reperfusion occluded LAD treated with a bare-metal stent complicated by catheter-induced left mainstem dissection and also treated with a bare-metal stent., initial LV function 25-30% followup echocardiogram ejection fraction 45%   . Panic attacks   . Rheumatic heart disease    s/p St. Jude mitral valve in 2007  . Shortness of breath   . Spina bifida (HCC)   . Stroke (HCC) 09/24/2012   TIA; "issues w/my memory since" (09/03/2016)    Medications:  Prescriptions Prior to Admission  Medication Sig Dispense Refill Last Dose  . carvedilol  (COREG) 6.25 MG tablet Take 1 tablet (6.25 mg total) by mouth 2 (two) times daily with a meal. 60 tablet 0 09/13/2016 at 1700  . HYDROcodone-acetaminophen (NORCO/VICODIN) 5-325 MG tablet Take 1 tablet by mouth 3 (three) times daily as needed for moderate pain. 20 tablet 0 09/14/2016 at Unknown time  . Melatonin 3 MG CAPS Take 3 mg by mouth at bedtime as needed (for sleep).   Past Month at Unknown time  . promethazine (PHENERGAN) 25 MG tablet Take 25 mg by mouth 2 (two) times daily as needed for nausea or vomiting.    Past Month at Unknown time  . warfarin (COUMADIN) 5 MG tablet Take 2.5 mg once on 10/21 Start taking your usual regimen on 10/22 (7.5 mg by mouth on Monday and Thursday. Take 10mg  by mouth on all other days) Go to Dr Bonnita LevanVyas's office on 10/23 for INR check-and ask Dr Sherril CroonVyas for further dosing instructions 30 tablet 2 09/14/2016 at 1700  . NITROSTAT 0.4 MG SL tablet DISSOLVE ONE TABLET UNDER TONGUE EVERY 5 MINUTES UP TO 3 DOSES AS NEEDED FOR CHEST PAIN (Patient taking differently: DISSOLVE 0.4 MG UNDER TONGUE EVERY 5 MINUTES UP TO 3 DOSES AS NEEDED FOR CHEST PAIN) 25 tablet 3 Past Month at Unknown   Scheduled:  . atorvastatin  20 mg Oral q1800  . carvedilol  6.25 mg Oral BID WC  . ciprofloxacin  400 mg Intravenous Q12H  . enalapril  5 mg Oral BID   Infusions:  . dextrose 5 % and 0.9 % NaCl with KCl 20 mEq/L 75 mL/hr at 09/16/16 0007    Assessment: 55 yo F presents on 10/23 with possible bowel perforation or surgical infection s/p gallbladder removal. PMH includes rheumatic heart disease s/p St. Jude mitral valve replacement.  On Coumadin 10mg  daily except for 7.5mg  on Mon/Thurs PTA for mitral valve replacement. Last INR was on 10/20 and was slightly elevated at 3.68, now 3.08. Pharmacy consulted for heparin, was recently on heparin gtt a few days ago and was therapeutic at 1,400 units/hr.  Goal of Therapy:  Heparin level 0.3-0.7 units/ml Monitor platelets by anticoagulation protocol:  Yes   Plan:  Will start heparin when INR <2.5.  Vernard Gambles, PharmD, BCPS  09/16/2016,12:23 AM

## 2016-09-16 NOTE — Progress Notes (Signed)
[facility] Progress Note     Subjective: RUQ abdominal pain decreased compared to yesterday, rated 7/10. Persistent nausea. Denies vomiting. Denies BM.   HIDA negative for post-operative bile leak.  Objective: Vital signs in last 24 hours: Temp:  [98.4 F (36.9 C)-98.6 F (37 C)] 98.4 F (36.9 C) (10/24 0730) Pulse Rate:  [72-85] 72 (10/24 0730) Resp:  [18-20] 18 (10/24 0730) BP: (126-152)/(59-93) 143/93 (10/24 1133) SpO2:  [95 %-96 %] 95 % (10/24 0730) Last BM Date: 09/15/16  Intake/Output from previous day: No intake/output data recorded. Intake/Output this shift: No intake/output data recorded.  PE: Gen:  Alert, NAD, pleasant Abd: Soft, mild tenderness RUQ, +BS, incisions C/D/I, no guarding or peritonitis  Lab Results:   Recent Labs  09/15/16 2224  WBC 11.9*  HGB 14.8  HCT 44.2  PLT 369   BMET  Recent Labs  09/15/16 2224  NA 141  K 4.2  CL 107  CO2 25  GLUCOSE 102*  BUN 7  CREATININE 0.90  CALCIUM 9.0   PT/INR  Recent Labs  09/15/16 2224  LABPROT 32.5*  INR 3.08   CMP     Component Value Date/Time   NA 141 09/15/2016 2224   K 4.2 09/15/2016 2224   CL 107 09/15/2016 2224   CO2 25 09/15/2016 2224   GLUCOSE 102 (H) 09/15/2016 2224   BUN 7 09/15/2016 2224   CREATININE 0.90 09/15/2016 2224   CALCIUM 9.0 09/15/2016 2224   PROT 7.1 09/15/2016 2224   ALBUMIN 3.1 (L) 09/15/2016 2224   AST 22 09/15/2016 2224   ALT 20 09/15/2016 2224   ALKPHOS 119 09/15/2016 2224   BILITOT 0.6 09/15/2016 2224   GFRNONAA >60 09/15/2016 2224   GFRAA >60 09/15/2016 2224   Studies/Results: Nm Hepatobiliary Liver Func  Result Date: 09/16/2016 CLINICAL DATA:  Right upper quadrant abdominal pain, nausea/ vomiting, status post cholecystectomy on 09/08/2016, evaluate for bile leak EXAM: NUCLEAR MEDICINE HEPATOBILIARY IMAGING TECHNIQUE: Sequential images of the abdomen were obtained out to 60 minutes following intravenous administration of  radiopharmaceutical. RADIOPHARMACEUTICALS:  5.2 mCi Tc-189m  Choletec IV COMPARISON:  CT abdomen/pelvis dated 09/15/2016 FINDINGS: Normal hepatic excretion. Visualization of small bowel within 20 minutes. No radiotracer excretion within the gallbladder fossa or along the inferior margin of the right hepatic lobe suggestive of bile leak. IMPRESSION: No findings suspicious for bile leak. Electronically Signed   By: Charline BillsSriyesh  Krishnan M.D.   On: 09/16/2016 11:30   Anti-infectives: Anti-infectives    Start     Dose/Rate Route Frequency Ordered Stop   09/15/16 2230  ciprofloxacin (CIPRO) IVPB 400 mg     400 mg 200 mL/hr over 60 Minutes Intravenous Every 12 hours 09/15/16 2215       Assessment/Plan Abdominal pain  Nausea/vomiting S/p laparoscopic cholecystectomy 09/08/16, Dr. Ezzard StandingNewman - CT [facility] 10[facility] concerning for possible biloma of GB fossa and [facility]  - fluid collection ~3 x 5 cm - HIDA scan negative for postoperative bile leak - WBC 11.9 today from 16  - of note, patient on coumadin for cardiac valve. This has been held. Patient on heparin.  - Spoke with Dr. Deanne CofferHassell of IR regarding possible drainage of fluid collection in GB fossa. If the patient fails medical management then IR has a safe window for drainage.   FEN: start clear liquids  ID: ciprofloxacin 10/23 >> VTE: heparin per pharmacy, SCD's  Plan: IV abx, anti-emetics, clears    LOS: 1 day  Adam Phenix , [facility], [facility], [facility] 09/16/2016, 11:59 AM Pager: [phone] Consults: [phone] Mon-Fri 7:00 am-4:30 pm Sat-Sun 7:00 am-11:30 am

## 2016-09-16 NOTE — Progress Notes (Signed)
ANTICOAGULATION CONSULT NOTE Pharmacy Consult for heparin Indication: MVR  Allergies  Allergen Reactions  . Cyclobenzaprine Hcl Nausea And Vomiting  . Latex Other (See Comments)    Unknown    Patient Measurements: Heparin Dosing Weight: 70kg  Vital Signs: Temp: 98.4 F (36.9 C) (10/24 0730) Temp Source: Oral (10/24 0730) BP: 143/93 (10/24 1133) Pulse Rate: 72 (10/24 0730)  Labs:  Recent Labs  09/15/16 2224 09/16/16 1135  HGB 14.8  --   HCT 44.2  --   PLT 369  --   APTT 51*  --   LABPROT 32.5* 34.4*  INR 3.08 3.31  CREATININE 0.90 0.88    Estimated Creatinine Clearance: 71.4 mL/min (by C-G formula based on SCr of 0.88 mg/dL).   Assessment: 55 yo F presents on 10/23 with possible bowel perforation or surgical infection s/p gallbladder removal. PMH includes rheumatic heart disease s/p St. Jude mitral valve replacement.  On Coumadin 10mg  daily except for 7.5mg  on Mon/Thurs PTA for mitral valve replacement. Last INR was on 10/20 and was slightly elevated at 3.68. INR today is 3.31.   Pharmacy consulted for heparin, was recently on heparin gtt a few days ago and was therapeutic at 1,400 units/hr. HIDA neg for bile leak.   Goal of Therapy:  Heparin level 0.3-0.7 units/ml Monitor platelets by anticoagulation protocol: Yes   Plan:  Heparin drip has not been started since INR remains > 2.5 Will start heparin when INR <2.5. F/u for coumadin to be resumed if no invasive procedures needed  Herby AbrahamMichelle T. Teigen Bellin, Pharm.D. [phone] 09/16/2016 1:48 PM

## 2016-09-17 LAB — URINALYSIS, ROUTINE W REFLEX MICROSCOPIC
BILIRUBIN URINE: NEGATIVE
Glucose, UA: NEGATIVE mg/dL
KETONES UR: NEGATIVE mg/dL
LEUKOCYTES UA: NEGATIVE
NITRITE: NEGATIVE
PROTEIN: NEGATIVE mg/dL
Specific Gravity, Urine: 1.01 (ref 1.005–1.030)
pH: 5.5 (ref 5.0–8.0)

## 2016-09-17 LAB — CBC
HCT: 40.9 % (ref 36.0–46.0)
Hemoglobin: 13.8 g/dL (ref 12.0–15.0)
MCH: 32.9 pg (ref 26.0–34.0)
MCHC: 33.7 g/dL (ref 30.0–36.0)
MCV: 97.6 fL (ref 78.0–100.0)
Platelets: 359 10*3/uL (ref 150–400)
RBC: 4.19 MIL/uL (ref 3.87–5.11)
RDW: 14.4 % (ref 11.5–15.5)
WBC: 11.5 10*3/uL — AB (ref 4.0–10.5)

## 2016-09-17 LAB — C DIFFICILE QUICK SCREEN W PCR REFLEX
C DIFFICILE (CDIFF) INTERP: NOT DETECTED
C DIFFICILE (CDIFF) TOXIN: NEGATIVE
C DIFFICLE (CDIFF) ANTIGEN: NEGATIVE

## 2016-09-17 LAB — URINE MICROSCOPIC-ADD ON

## 2016-09-17 LAB — PROTIME-INR
INR: 3.19
PROTHROMBIN TIME: 33.4 s — AB (ref 11.4–15.2)

## 2016-09-17 MED ORDER — BISMUTH SUBSALICYLATE 262 MG/15ML PO SUSP: 30.0000 mL | ORAL | Status: DC | PRN

## 2016-09-17 MED ORDER — FAMOTIDINE 20 MG PO TABS
20.0000 mg | ORAL_TABLET | Freq: Two times a day (BID) | ORAL | Status: DC
  Administered 2016-09-17 – 2016-09-23 (×13): 20 mg via ORAL
  Filled 2016-09-17 (×14): qty 1

## 2016-09-17 NOTE — Progress Notes (Signed)
Urine and stool sample sent to lab for testing [name] Brooke Safari Cinque, RN

## 2016-09-17 NOTE — Progress Notes (Signed)
ANTICOAGULATION CONSULT NOTE Pharmacy Consult for heparin Indication: MVR  Allergies  Allergen Reactions  . Cyclobenzaprine Hcl Nausea And Vomiting  . Latex Other (See Comments)    Unknown    Patient Measurements: Heparin Dosing Weight: 70kg  Vital Signs: Temp: 98.6 F (37 C) (10/25 0500) Temp Source: Oral (10/25 0500) BP: 150/51 (10/25 0500) Pulse Rate: 71 (10/25 0500)  Labs:  Recent Labs  09/15/16 2224 09/16/16 1135 09/17/16 0747  HGB 14.8  --  13.8  HCT 44.2  --  40.9  PLT 369  --  359  APTT 51*  --   --   LABPROT 32.5* 34.4* 33.4*  INR 3.08 3.31 3.19  CREATININE 0.90 0.88  --     Estimated Creatinine Clearance: 71.4 mL/min (by C-G formula based on SCr of 0.88 mg/dL).   Assessment: 55 yo F presents on 10/23 with possible bowel perforation or surgical infection s/p gallbladder removal. PMH includes rheumatic heart disease s/p St. Jude mitral valve replacement.  On Coumadin 10mg  daily except for 7.5mg  on Mon/Thurs PTA for mitral valve replacement. Last coumadin dose taken 10/22 at 1700. Coumadin held x 2 days. INR today is 3.19.   Pharmacy consulted for heparin, was recently on heparin gtt a few days ago and was therapeutic at 1,400 units/hr. HIDA neg for bile leak.   Goal of Therapy:  Heparin level 0.3-0.7 units/ml Monitor platelets by anticoagulation protocol: Yes   Plan:  Heparin drip has not been started since INR remains > 2.5 Will start heparin when INR <2.5. Consider resuing coumadin if no invasive procedures needed  Herby AbrahamMichelle T. Justeen Hehr, Pharm.D. [phone] 09/17/2016 10:03 AM

## 2016-09-17 NOTE — Care Management Note (Signed)
Case Management Note  Patient Details  Name: [name] MRN: [id number] Date of Birth: [birth year]  Subjective/Objective:  S/p laparoscopic cholecystectomy 09/08/16                  Action/Plan: Discharge Planning: Chart reviewed. Will continue to follow for dc needs.   PCP- Doreen BeamVYAS, DHRUV B MD  Expected Discharge Date:                  Expected Discharge Plan:  Home/Self Care  In-House Referral:  NA  Discharge planning Services  CM Consult  Post Acute Care Choice:  NA Choice offered to:  NA  DME Arranged:  N/A DME Agency:  NA  HH Arranged:  NA HH Agency:  NA  Status of Service:  In process, will continue to follow  If discussed at Long Length of Stay Meetings, dates discussed:    Additional Comments:  Elliot CousinShavis, Lakesia Dahle Ellen, RN 09/17/2016, 3:22 PM

## 2016-09-17 NOTE — Progress Notes (Signed)
Subjective: Complains of nausea and says she vomits after eating, nothing recorded.  Had 3 loose stools yesterday, still complains of some pain mostly mid upper abdomen below the sternum.    Objective: Vital signs in last 24 hours: Temp:  [98.2 F (36.8 C)-98.6 F (37 C)] 98.6 F (37 C) (10/25 0500) Pulse Rate:  [71-75] 71 (10/25 0500) Resp:  [18] 18 (10/25 0500) BP: (150-164)/(51-72) 150/51 (10/25 0500) SpO2:  [96 %-97 %] 97 % (10/25 0500) Last BM Date: 09/15/16 360 PO Voided x 5 BM x 1 Afebrile, VSS WBC still up some 11.5 INR 3.19 Intake/Output from previous day: 10/24 0701 - 10/25 0700 In: 360 [P.O.:360] Out: -  Intake/Output this shift: No intake/output data recorded.  General appearance: alert, cooperative and no distress Resp: clear to auscultation bilaterally GI: soft, sore around sites, no distension, port sites all look fine.  Positive bowel sounds some diarrhea  Lab Results:   Recent Labs  09/15/16 2224 09/17/16 0747  WBC 11.9* 11.5*  HGB 14.8 13.8  HCT 44.2 40.9  PLT 369 359    BMET  Recent Labs  09/15/16 2224 09/16/16 1135  NA 141 137  K 4.2 3.7  CL 107 104  CO2 25 25  GLUCOSE 102* 138*  BUN 7 7  CREATININE 0.90 0.88  CALCIUM 9.0 8.7*   PT/INR  Recent Labs  09/16/16 1135 09/17/16 0747  LABPROT 34.4* 33.4*  INR 3.31 3.19     Recent Labs Lab 09/15/16 2224 09/16/16 1135  AST 22 30  ALT 20 17  ALKPHOS 119 108  BILITOT 0.6 0.6  PROT 7.1 6.5  ALBUMIN 3.1* 3.0*     Lipase  No results found for: LIPASE   Studies/Results: Nm Hepatobiliary Liver Func  Result Date: 09/16/2016 CLINICAL DATA:  Right upper quadrant abdominal pain, nausea/ vomiting, status post cholecystectomy on 09/08/2016, evaluate for bile leak EXAM: NUCLEAR MEDICINE HEPATOBILIARY IMAGING TECHNIQUE: Sequential images of the abdomen were obtained out to 60 minutes following intravenous administration of radiopharmaceutical. RADIOPHARMACEUTICALS:  5.2 mCi  Tc-1m  Choletec IV COMPARISON:  CT abdomen/pelvis dated 09/15/2016 FINDINGS: Normal hepatic excretion. Visualization of small bowel within 20 minutes. No radiotracer excretion within the gallbladder fossa or along the inferior margin of the right hepatic lobe suggestive of bile leak. IMPRESSION: No findings suspicious for bile leak. Electronically Signed   By: Charline Bills M.D.   On: 09/16/2016 11:30    Medications: . atorvastatin  20 mg Oral q1800  . carvedilol  6.25 mg Oral BID WC  . ciprofloxacin  400 mg Intravenous Q12H  . enalapril  5 mg Oral BID   . dextrose 5 % and 0.9 % NaCl with KCl 20 mEq/L 75 mL/hr at 09/17/16 1610   COPD/ongoing tobacco use  Anxiety HLD HTN Chronic back pain  Assessment/Plan Abdominal pain  Nausea/vomiting/loose stools S/p laparoscopic cholecystectomy 09/08/16, Dr. Ezzard Standing - CT scan 10/23 at [facility] concerning for possible biloma of GB fossa and patient was transferred to [facility]  - fluid collection ~3 x 5 cm - HIDA scan negative for postoperative bile leak - WBC 11.9 yesterday, 11.5 today,  from 16  - [name] and left main stent on coumadin - INR 3.19  - Heparin protocol.   - Spoke with Dr. Deanne Coffer of IR regarding possible drainage of fluid collection in GB fossa. If the patient fails medical management then IR has a safe window for drainage.   FEN: start clear liquids/IV fluids  - allow her fulls  if she feels better. ID: ciprofloxacin day 3 today, she had 7 days of Zosyn 10/13-10/19/17 - Holding Cipro VTE: heparin per pharmacy, INR 3.19 -  SCD's     Plan:   Stop Cipro, check urine, check C diff, add Pepcid and If  C diff is negative will add Pepto Bismol.  I will increase her to full liquids and let her try more if she likes.  Recheck labs is AM.     C diff is negative, I have added Pepto Bismol to help with indigestion and loose stools.  LOS: 2 days    [name] 09/17/2016 [phone]

## 2016-09-18 LAB — BASIC METABOLIC PANEL
Anion gap: 7 (ref 5–15)
CHLORIDE: 105 mmol/L (ref 101–111)
CO2: 25 mmol/L (ref 22–32)
CREATININE: 0.91 mg/dL (ref 0.44–1.00)
Calcium: 8.5 mg/dL — ABNORMAL LOW (ref 8.9–10.3)
GFR calc Af Amer: >60 mL/min (ref >60)
GFR calc non Af Amer: >60 mL/min (ref >60)
GLUCOSE: 114 mg/dL — AB (ref 65–99)
POTASSIUM: 4.6 mmol/L (ref 3.5–5.1)
Sodium: 137 mmol/L (ref 135–145)

## 2016-09-18 LAB — CBC
HEMATOCRIT: 38.7 % (ref 36.0–46.0)
HEMOGLOBIN: 12.9 g/dL (ref 12.0–15.0)
MCH: 32.8 pg (ref 26.0–34.0)
MCHC: 33.3 g/dL (ref 30.0–36.0)
MCV: 98.5 fL (ref 78.0–100.0)
Platelets: 309 10*3/uL (ref 150–400)
RBC: 3.93 MIL/uL (ref 3.87–5.11)
RDW: 14.2 % (ref 11.5–15.5)
WBC: 9.7 10*3/uL (ref 4.0–10.5)

## 2016-09-18 LAB — PROTIME-INR
INR: 2.99
PROTHROMBIN TIME: 31.7 s — AB (ref 11.4–15.2)

## 2016-09-18 MED ORDER — LOPERAMIDE HCL 2 MG PO CAPS: 2.0000 mg | ORAL_CAPSULE | ORAL | Status: DC | PRN

## 2016-09-18 NOTE — Progress Notes (Signed)
ANTICOAGULATION CONSULT NOTE Pharmacy Consult for heparin Indication: MVR  Allergies  Allergen Reactions  . Cyclobenzaprine Hcl Nausea And Vomiting  . Latex Other (See Comments)    Unknown    Patient Measurements: Heparin Dosing Weight: 70kg  Vital Signs: Temp: 97.1 F (36.2 C) (10/26 0529) Temp Source: Oral (10/26 0529) BP: 158/60 (10/26 0832) Pulse Rate: 68 (10/26 0832)  Labs:  Recent Labs  09/15/16 2224 09/16/16 1135 09/17/16 0747 09/18/16 0552  HGB 14.8  --  13.8 12.9  HCT 44.2  --  40.9 38.7  PLT 369  --  359 309  APTT 51*  --   --   --   LABPROT 32.5* 34.4* 33.4* 31.7*  INR 3.08 3.31 3.19 2.99  CREATININE 0.90 0.88  --  0.91    CrCl cannot be calculated (Unknown ideal weight.).   Assessment: 55 yo F presents on 10/23 with possible bowel perforation or surgical infection s/p gallbladder removal. PMH includes rheumatic heart disease s/p St. Jude mitral valve replacement.  On Coumadin 10mg  daily except for 7.5mg  on Mon/Thurs PTA for mitral valve replacement. Last coumadin dose taken 10/22 at 1700. Coumadin held x 3 days. INR today is 2.99.   Pharmacy consulted for heparin, was recently on heparin gtt a few days ago and was therapeutic at 1,400 units/hr. HIDA neg for bile leak.   Goal of Therapy:  Heparin level 0.3-0.7 units/ml Monitor platelets by anticoagulation protocol: Yes   Plan:  Heparin drip has not been started since INR remains > 2.5 CCS planning to restart coumadin per pharmacy tomorrow if pt continues to improve Daily INR Herby AbrahamMichelle T. Niambi Smoak, Pharm.D. [phone] 09/18/2016 10:53 AM

## 2016-09-18 NOTE — Progress Notes (Signed)
[facility] Progress Note     Subjective: Reports feeling much better today - some nausea after meals but it is controlled with medication. Abdominal pain decreasing. Urinating without hesitancy. Having 3 BMs after each meal (~9 daily, c.dif negative). Ambulating in room.  Objective: Vital signs in last 24 hours: Temp:  [97.1 F (36.2 C)-97.8 F (36.6 C)] 97.1 F (36.2 C) (10/26 0529) Pulse Rate:  [62-68] 68 (10/26 0832) Resp:  [16] 16 (10/26 0529) BP: (135-158)/(57-68) 158/60 (10/26 0832) SpO2:  [97 %-98 %] 97 % (10/26 0832) Last BM Date: 09/17/16  Intake/Output from previous day: 10/25 0701 - 10/26 0700 In: 701.3 [I.V.:701.3] Out: 300 [Urine:300] Intake/Output this shift: No intake/output data recorded.  PE: Gen:  Alert, NAD, pleasant Card:  RRR, no M/G/R appreciated Abd: Soft, NT/ND, +BS, incisions C/D/I  Lab Results:   Recent Labs  09/17/16 0747 09/18/16 0552  WBC 11.5* 9.7  HGB 13.8 12.9  HCT 40.9 38.7  PLT 359 309   BMET  Recent Labs  09/16/16 1135 09/18/16 0552  NA 137 137  K 3.7 4.6  CL 104 105  CO2 25 25  GLUCOSE 138* 114*  BUN 7 <5*  CREATININE 0.88 0.91  CALCIUM 8.7* 8.5*   PT/INR  Recent Labs  09/17/16 0747 09/18/16 0552  LABPROT 33.4* 31.7*  INR 3.19 2.99   CMP     Component Value Date/Time   NA 137 09/18/2016 0552   K 4.6 09/18/2016 0552   CL 105 09/18/2016 0552   CO2 25 09/18/2016 0552   GLUCOSE 114 (H) 09/18/2016 0552   BUN <5 (L) 09/18/2016 0552   CREATININE 0.91 09/18/2016 0552   CALCIUM 8.5 (L) 09/18/2016 0552   PROT 6.5 09/16/2016 1135   ALBUMIN 3.0 (L) 09/16/2016 1135   AST 30 09/16/2016 1135   ALT 17 09/16/2016 1135   ALKPHOS 108 09/16/2016 1135   BILITOT 0.6 09/16/2016 1135   GFRNONAA >60 09/18/2016 0552   GFRAA >60 09/18/2016 0552   Studies/Results: Nm Hepatobiliary Liver Func  Result Date: 09/16/2016 CLINICAL DATA:  Right upper quadrant abdominal pain, nausea/ vomiting, status post  cholecystectomy on 09/08/2016, evaluate for bile leak EXAM: NUCLEAR MEDICINE HEPATOBILIARY IMAGING TECHNIQUE: Sequential images of the abdomen were obtained out to 60 minutes following intravenous administration of radiopharmaceutical. RADIOPHARMACEUTICALS:  5.2 mCi Tc-54m  Choletec IV COMPARISON:  CT abdomen/pelvis dated 09/15/2016 FINDINGS: Normal hepatic excretion. Visualization of small bowel within 20 minutes. No radiotracer excretion within the gallbladder fossa or along the inferior margin of the right hepatic lobe suggestive of bile leak. IMPRESSION: No findings suspicious for bile leak. Electronically Signed   By: Charline Bills M.D.   On: 09/16/2016 11:30    Anti-infectives: Anti-infectives    Start     Dose/Rate Route Frequency Ordered Stop   09/15/16 2230  ciprofloxacin (CIPRO) IVPB 400 mg  Status:  Discontinued     400 mg 200 mL/hr over 60 Minutes Intravenous Every 12 hours 09/15/16 2215 09/17/16 1249     Assessment/Plan Abdominal pain  Nausea/vomiting/loose stools S/p laparoscopic cholecystectomy 09/08/16, Dr. Ezzard Standing - CT scan 10/23 at [facility] concerning for possible biloma of GB fossa and patient was transferred to [facility]  - fluid collection ~3 x 5 cm  - HIDA scan negative for postoperative bile leak - leukocytosis resolved 10/26 - 10/25 pepto bismol and pepcid added to improve nausea. - [name] and left main stent on coumadin - INR 3.19  - Heparin protocol.   - Spoke  with Dr. Deanne CofferHassell of IR regarding possible drainage of fluid collection in GB fossa. If the patient fails medical management then IR has a safe window for drainage.   FEN: soft diet ID: ciprofloxacin day 10/23-10/25, she had 7 days of Zosyn 10/13-10/19/17  VTE: heparin per pharmacy, INR 3.19 -  SCD's     Plan: low dose imodium for diarrhea, advance to soft diet  Ambulate in hall!  Plan to re-start coumadin tomorrow per pharmacy if patient still improving. CBC in AM   LOS: 3 days     Adam PhenixElizabeth S Floella Ensz , [facility] 09/18/2016, 8:56 AM Pager: [phone] Consults: [phone] Mon-Fri 7:00 am-4:30 pm Sat-Sun 7:00 am-11:30 am

## 2016-09-18 NOTE — Discharge Instructions (Addendum)
Information on my medicine - Coumadin   (Warfarin)  This medication education was reviewed with me or my healthcare representative as part of my discharge preparation.  The pharmacist that spoke with me during my hospital stay was:  Herby AbrahamBell, Michelle T, [facility]PH  Why was Coumadin prescribed for you? Coumadin was prescribed for you because you have a blood clot or a medical condition that can cause an increased risk of forming blood clots. Blood clots can cause serious health problems by blocking the flow of blood to the heart, lung, or brain. Coumadin can prevent harmful blood clots from forming. As a reminder your indication for Coumadin is:   Blood Clot Prevention After Heart Valve Surgery  What test will check on my response to Coumadin? While on Coumadin (warfarin) you will need to have an INR test regularly to ensure that your dose is keeping you in the desired range. The INR (international normalized ratio) number is calculated from the result of the laboratory test called prothrombin time (PT).  If an INR APPOINTMENT HAS NOT ALREADY BEEN MADE FOR YOU please schedule an appointment to have this lab work done by your health care provider within 7 days. Your INR goal is usually a number between:  2 to 3 or your provider may give you a more narrow range like 2-2.5.  Ask your health care provider during an office visit what your goal INR is.  What  do you need to  know  About  COUMADIN? Take Coumadin (warfarin) exactly as prescribed by your healthcare provider about the same time each day.  DO NOT stop taking without talking to the doctor who prescribed the medication.  Stopping without other blood clot prevention medication to take the place of Coumadin may increase your risk of developing a new clot or stroke.  Get refills before you run out.  What do you do if you miss a dose? If you miss a dose, take it as soon as you remember on the same day then continue your regularly scheduled regimen the next  day.  Do not take two doses of Coumadin at the same time.  Important Safety Information A possible side effect of Coumadin (Warfarin) is an increased risk of bleeding. You should call your healthcare provider right away if you experience any of the following: ? Bleeding from an injury or your nose that does not stop. ? Unusual colored urine (red or dark brown) or unusual colored stools (red or black). ? Unusual bruising for unknown reasons. ? A serious fall or if you hit your head (even if there is no bleeding).  Some foods or medicines interact with Coumadin (warfarin) and might alter your response to warfarin. To help avoid this: ? Eat a balanced diet, maintaining a consistent amount of Vitamin K. ? Notify your provider about major diet changes you plan to make. ? Avoid alcohol or limit your intake to 1 drink for women and 2 drinks for men per day. (1 drink is 5 oz. wine, 12 oz. beer, or 1.5 oz. liquor.)  Make sure that ANY health care provider who prescribes medication for you knows that you are taking Coumadin (warfarin).  Also make sure the healthcare provider who is monitoring your Coumadin knows when you have started a new medication including herbals and non-prescription products.  Coumadin (Warfarin)  Major Drug Interactions  Increased Warfarin Effect Decreased Warfarin Effect  Alcohol (large quantities) Antibiotics (esp. Septra/Bactrim, Flagyl, Cipro) Amiodarone (Cordarone) Aspirin (ASA) Cimetidine (Tagamet) Megestrol (Megace) NSAIDs (  ibuprofen, naproxen, etc.) Piroxicam (Feldene) Propafenone (Rythmol SR) Propranolol (Inderal) Isoniazid (INH) Posaconazole (Noxafil) Barbiturates (Phenobarbital) Carbamazepine (Tegretol) Chlordiazepoxide (Librium) Cholestyramine (Questran) Griseofulvin Oral Contraceptives Rifampin Sucralfate (Carafate) Vitamin K   Coumadin (Warfarin) Major Herbal Interactions  Increased Warfarin Effect Decreased Warfarin Effect   Garlic Ginseng Ginkgo biloba Coenzyme Q10 Green tea St. Johns wort    Coumadin (Warfarin) FOOD Interactions  Eat a consistent number of servings per week of foods HIGH in Vitamin K (1 serving =  cup)  Collards (cooked, or boiled & drained) Kale (cooked, or boiled & drained) Mustard greens (cooked, or boiled & drained) Parsley *serving size only =  cup Spinach (cooked, or boiled & drained) Swiss chard (cooked, or boiled & drained) Turnip greens (cooked, or boiled & drained)  Eat a consistent number of servings per week of foods MEDIUM-HIGH in Vitamin K (1 serving = 1 cup)  Asparagus (cooked, or boiled & drained) Broccoli (cooked, boiled & drained, or raw & chopped) Brussel sprouts (cooked, or boiled & drained) *serving size only =  cup Lettuce, raw (green leaf, endive, romaine) Spinach, raw Turnip greens, raw & chopped   These websites have more information on Coumadin (warfarin):  http://www.king-russell.com/; https://www.hines.net/;   Esophagitis  You have H. Pylori bacteria that is being treated.  Complete your antibiotics as instructed.  Esophagitis is inflammation of the esophagus. The esophagus is the tube that carries food and liquids from your mouth to your stomach. Esophagitis can cause soreness or pain in the esophagus. This condition can make it difficult and painful to swallow.  CAUSES Most causes of esophagitis are not serious. Common causes of this condition include:  Gastroesophageal reflux disease (GERD). This is when stomach contents move back up into the esophagus (reflux).  Repeated vomiting.  An allergic-type reaction, especially caused by food allergies (eosinophilic esophagitis).  Injury to the esophagus by swallowing large pills with or without water, or swallowing certain types of medicines.  Swallowing (ingesting) harmful chemicals, such as household cleaning products.  Heavy alcohol use.  An infection of the esophagus.This most often  occurs in people who have a weakened immune system.  Radiation or chemotherapy treatment for cancer.  Certain diseases such as sarcoidosis, Crohn disease, and scleroderma. SYMPTOMS Symptoms of this condition include:  Difficult or painful swallowing.  Pain with swallowing acidic liquids, such as citrus juices.  Pain with burping.  Chest pain.  Difficulty breathing.  Nausea.  Vomiting.  Pain in the abdomen.  Weight loss.  Ulcers in the mouth.  Patches of white material in the mouth (candidiasis).  Fever.  Coughing up blood or vomiting blood.  Stool that is black, tarry, or bright red. DIAGNOSIS Your health care provider will take a medical history and perform a physical exam. You may also have other tests, including:  An endoscopy to examine your stomach and esophagus with a small camera.  A test that measures the acidity level in your esophagus.  A test that measures how much pressure is on your esophagus.  A barium swallow or modified barium swallow to show the shape, size, and functioning of your esophagus.  Allergy tests. TREATMENT Treatment for this condition depends on the cause of your esophagitis. In some cases, steroids or other medicines may be given to help relieve your symptoms or to treat the underlying cause of your condition. You may have to make some lifestyle changes, such as:  Avoiding alcohol.  Quitting smoking.  Changing your diet.  Exercising.  Changing your sleep habits and your  sleep environment. HOME CARE INSTRUCTIONS Take these actions to decrease your discomfort and to help avoid complications. Diet  Follow a diet as recommended by your health care provider. This may involve avoiding foods and drinks such as:  Coffee and tea (with or without caffeine).  Drinks that contain alcohol.  Energy drinks and sports drinks.  Carbonated drinks or sodas.  Chocolate and cocoa.  Peppermint and mint flavorings.  Garlic and  onions.  Horseradish.  Spicy and acidic foods, including peppers, chili powder, curry powder, vinegar, hot sauces, and barbecue sauce.  Citrus fruit juices and citrus fruits, such as oranges, lemons, and limes.  Tomato-based foods, such as red sauce, chili, salsa, and pizza with red sauce.  Fried and fatty foods, such as donuts, french fries, potato chips, and high-fat dressings.  High-fat meats, such as hot dogs and fatty cuts of red and white meats, such as rib eye steak, sausage, ham, and bacon.  High-fat dairy items, such as whole milk, butter, and cream cheese.  Eat small, frequent meals instead of large meals.  Avoid drinking large amounts of liquid with your meals.  Avoid eating meals during the 2-3 hours before bedtime.  Avoid lying down right after you eat.  Do not exercise right after you eat.  Avoid foods and drinks that seem to make your symptoms worse. General Instructions  Pay attention to any changes in your symptoms.  Take over-the-counter and prescription medicines only as told by your health care provider. Do not take aspirin, ibuprofen, or other NSAIDs unless your health care provider told you to do so.  If you have trouble taking pills, use a pill splitter to decrease the size of the pill. This will decrease the chance of the pill getting stuck or injuring your esophagus on the way down. Also, drink water after you take a pill.  Do not use any tobacco products, including cigarettes, chewing tobacco, and e-cigarettes. If you need help quitting, ask your health care provider.  Wear loose-fitting clothing. Do not wear anything tight around your waist that causes pressure on your abdomen.  Raise (elevate) the head of your bed about 6 inches (15 cm).  Try to reduce your stress, such as with yoga or meditation. If you need help reducing stress, ask your health care provider.  If you are overweight, reduce your weight to an amount that is healthy for you. Ask  your health care provider for guidance about a safe weight loss goal.  Keep all follow-up visits as told by your health care provider. This is important. SEEK MEDICAL CARE IF:  You have new symptoms.  You have unexplained weight loss.  You have difficulty swallowing, or it hurts to swallow.  You have wheezing or a persistent cough.  Your symptoms do not improve with treatment.  You have frequent heartburn for more than two weeks. SEEK IMMEDIATE MEDICAL CARE IF:  You have severe pain in your arms, neck, jaw, teeth, or back.  You feel sweaty, dizzy, or light-headed.  You have chest pain or shortness of breath.  You vomit and your vomit looks like blood or coffee grounds.  Your stool is bloody or black.  You have a fever.  You cannot swallow, drink, or eat.   This information is not intended to replace advice given to you by your health care provider. Make sure you discuss any questions you have with your health care provider.   Document Released: 12/18/2004 Document Revised: 08/01/2015 Document Reviewed: 03/07/2015 Elsevier Interactive Patient  Education 2016 [employer].

## 2016-09-19 ENCOUNTER — Inpatient Hospital Stay (HOSPITAL_COMMUNITY): Payer: Medicaid Other

## 2016-09-19 LAB — CBC
HCT: 43 % (ref 36.0–46.0)
Hemoglobin: 14 g/dL (ref 12.0–15.0)
MCH: 32 pg (ref 26.0–34.0)
MCHC: 32.6 g/dL (ref 30.0–36.0)
MCV: 98.2 fL (ref 78.0–100.0)
PLATELETS: 346 10*3/uL (ref 150–400)
RBC: 4.38 MIL/uL (ref 3.87–5.11)
RDW: 14.2 % (ref 11.5–15.5)
WBC: 10.6 10*3/uL — AB (ref 4.0–10.5)

## 2016-09-19 LAB — PROTIME-INR
INR: 2.29
Prothrombin Time: 25.6 seconds — ABNORMAL HIGH (ref 11.4–15.2)

## 2016-09-19 LAB — HEPARIN LEVEL (UNFRACTIONATED)

## 2016-09-19 MED ORDER — HEPARIN (PORCINE) IN NACL 100-0.45 UNIT/ML-% IJ SOLN
1050.0000 [IU]/h | INTRAMUSCULAR | Status: DC
  Administered 2016-09-19: 1200 [IU]/h via INTRAVENOUS
  Administered 2016-09-20 – 2016-09-23 (×4): 1050 [IU]/h via INTRAVENOUS
  Filled 2016-09-19 (×6): qty 250

## 2016-09-19 MED ORDER — IOPAMIDOL (ISOVUE-300) INJECTION 61%
INTRAVENOUS | Status: AC
  Administered 2016-09-19: 100 mL
  Filled 2016-09-19: qty 100

## 2016-09-19 MED ORDER — LOPERAMIDE HCL 2 MG PO CAPS
2.0000 mg | ORAL_CAPSULE | ORAL | Status: DC | PRN
  Administered 2016-09-20 – 2016-09-24 (×6): 2 mg via ORAL
  Filled 2016-09-19 (×5): qty 1

## 2016-09-19 NOTE — Plan of Care (Signed)
Problem: Education: Goal: Knowledge of [facility] Education information/materials will improve Outcome: Progressing POC reviewed with pt.   

## 2016-09-19 NOTE — Progress Notes (Signed)
[facility] Progress Note     Subjective: Patients reports feeling worse compared to yesterday. Persistent nausea, diarrhea, and intermittent sharp epigastric pain that is worse when she eats. Denies vomiting.   Objective: Vital signs in last 24 hours: Temp:  [98.2 F (36.8 C)-99.1 F (37.3 C)] 99.1 F (37.3 C) (10/27 0534) Pulse Rate:  [64-69] 64 (10/27 0534) Resp:  [16] 16 (10/27 0534) BP: (134-162)/(56-72) 134/56 (10/27 0534) SpO2:  [95 %-97 %] 95 % (10/27 0534) Last BM Date: 09/17/16  Intake/Output from previous day: 10/26 0701 - 10/27 0700 In: 360 [P.O.:360] Out: -  Intake/Output this shift: No intake/output data recorded.  PE: Gen:  Alert, NAD, pleasant Abd: Soft, NT/ND, +BS, incisions C/D/I  Lab Results:   Recent Labs  09/18/16 0552 09/19/16 0703  WBC 9.7 10.6*  HGB 12.9 14.0  HCT 38.7 43.0  PLT 309 346   BMET  Recent Labs  09/16/16 1135 09/18/16 0552  NA 137 137  K 3.7 4.6  CL 104 105  CO2 25 25  GLUCOSE 138* 114*  BUN 7 <5*  CREATININE 0.88 0.91  CALCIUM 8.7* 8.5*   PT/INR  Recent Labs  09/18/16 0552 09/19/16 0703  LABPROT 31.7* 25.6*  INR 2.99 2.29   CMP     Component Value Date/Time   NA 137 09/18/2016 0552   K 4.6 09/18/2016 0552   CL 105 09/18/2016 0552   CO2 25 09/18/2016 0552   GLUCOSE 114 (H) 09/18/2016 0552   BUN <5 (L) 09/18/2016 0552   CREATININE 0.91 09/18/2016 0552   CALCIUM 8.5 (L) 09/18/2016 0552   PROT 6.5 09/16/2016 1135   ALBUMIN 3.0 (L) 09/16/2016 1135   AST 30 09/16/2016 1135   ALT 17 09/16/2016 1135   ALKPHOS 108 09/16/2016 1135   BILITOT 0.6 09/16/2016 1135   GFRNONAA >60 09/18/2016 0552   GFRAA >60 09/18/2016 0552   Anti-infectives: Anti-infectives    Start     Dose/Rate Route Frequency Ordered Stop   09/15/16 2230  ciprofloxacin (CIPRO) IVPB 400 mg  Status:  Discontinued     400 mg 200 mL/hr over 60 Minutes Intravenous Every 12 hours 09/15/16 2215 09/17/16 1249      Assessment/Plan Abdominal pain  Nausea/vomiting/loose stools S/p laparoscopic cholecystectomy 09/08/16, Dr. Ezzard StandingNewman - CT [facility] for possible biloma of GB fossa and [name]  - fluid collection ~3 x 5 cm  - HIDA scan negative for postoperative bile leak - leukocytosis resolved 10/26, then recurred this AM  - WBC 10.6  - 10/25 pepto bismol and pepcid added to improve nausea. - [name] and left main stent on coumadin - INR 2.29 - Heparin protocol.  - Spoke with Dr. Deanne CofferHassell of IR regarding possible drainage of fluid collection in GB fossa. If the patient fails medical management then IR has a safe window for drainage.   FEN: soft diet ID: ciprofloxacin day 10/23-10/25, she had 7 days of Zosyn 10/13-10/19/17  VTE: heparin per pharmacy, INR 3.19 - SCD's   Plan: repeat CT Abd/Pelv W/ contrast, continue to hold coumadin  Imodium for diarrhea    LOS: 4 days    [name] , [name] 09/19/2016, 10:55 AM Pager: [phone] Consults: [phone] Mon-Fri 7:00 am-4:30 pm Sat-Sun 7:00 am-11:30 am

## 2016-09-19 NOTE — Progress Notes (Addendum)
ANTICOAGULATION CONSULT NOTE Pharmacy Consult for heparin Indication: MVR  Allergies  Allergen Reactions  . Cyclobenzaprine Hcl Nausea And Vomiting  . Latex Other (See Comments)    Unknown    Patient Measurements: Heparin Dosing Weight: 70kg  Vital Signs: Temp: 99.1 F (37.3 C) (10/27 0534) Temp Source: Oral (10/27 0534) BP: 134/56 (10/27 0534) Pulse Rate: 64 (10/27 0534)  Labs:  Recent Labs  09/17/16 0747 09/18/16 0552 09/19/16 0703  HGB 13.8 12.9 14.0  HCT 40.9 38.7 43.0  PLT 359 309 346  LABPROT 33.4* 31.7* 25.6*  INR 3.19 2.99 2.29  CREATININE  --  0.91  --     CrCl cannot be calculated (Unknown ideal weight.).   Assessment: 55 yo F presents on 10/23 with possible bowel perforation or surgical infection s/p gallbladder removal. PMH includes rheumatic heart disease s/p St. Jude mitral valve replacement.  On Coumadin 10mg  daily except for 7.5mg  on Mon/Thurs PTA for mitral valve replacement. Last coumadin dose taken 10/22 at 1700. Coumadin held x 3 days. INR today is 2.29, below goal of <2.5 so ok to start IV heparin.   Pharmacy consulted for heparin, was recently on heparin gtt a few days ago and was therapeutic at 1,400 units/hr. HIDA neg for bile leak.   Goal of Therapy:  Heparin level 0.3-0.7 units/ml Monitor platelets by anticoagulation protocol: Yes   Plan:  INR < 2.5 today, ok to start IV heparin.  Start IV heparin (no bolus) at 1200 units/hr.  Heparin level in 6 hours.  Follow-up restart of Coumadin.    Link SnufferJessica Millen, PharmD, BCPS Clinical Pharmacist [phone] 09/19/2016 1:02 PM    =================================   Addendum: - heparin infusion was never started, thus, heparin level is undetectable - night shift RN unaware and will start heparin   Pilot Prindle D. Laney Potashang, PharmD, BCPS Pager:  [phone] 09/19/2016, 8:55 PM

## 2016-09-20 LAB — PROTIME-INR
INR: 1.82
Prothrombin Time: 21.3 seconds — ABNORMAL HIGH (ref 11.4–15.2)

## 2016-09-20 LAB — CBC
HEMATOCRIT: 42 % (ref 36.0–46.0)
HEMOGLOBIN: 14 g/dL (ref 12.0–15.0)
MCH: 32.5 pg (ref 26.0–34.0)
MCHC: 33.3 g/dL (ref 30.0–36.0)
MCV: 97.4 fL (ref 78.0–100.0)
Platelets: 366 10*3/uL (ref 150–400)
RBC: 4.31 MIL/uL (ref 3.87–5.11)
RDW: 14 % (ref 11.5–15.5)
WBC: 10.9 10*3/uL — AB (ref 4.0–10.5)

## 2016-09-20 LAB — HEPARIN LEVEL (UNFRACTIONATED)
Heparin Unfractionated: 0.38 IU/mL (ref 0.30–0.70)
Heparin Unfractionated: 0.92 IU/mL — ABNORMAL HIGH (ref 0.30–0.70)
Heparin Unfractionated: 0.38 IU/mL (ref 0.30–0.70)

## 2016-09-20 NOTE — Consult Note (Signed)
Chief Complaint: Patient was seen in consultation today for CT guided drainage of GB fossa fluid collection.  Referring Physician(s): Claud Kelp  Supervising Physician: Jolaine Click  Patient Status: [facility] - In-pt  History of Present Illness: [name] is a 55 y.o. 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 Ezzard Standing, discharged on 10/20.  Readmitted from ED on 10/23 for abnormal CT completed at [facility], indicating possible post-op complications of bile leak vs infection.  CT abd on 10/27 showed fluid/gas collection in GB fossa indicating possible infection, amount was noted to be smaller than previous study at [facility]. HIDA scan 10/24 negative for post-op bile leak.   This service consulted for CT guided drainage of GB fossa fluid collection.  Patient admits to generalized abdominal pain, n/d, and 1 episode of vomiting this am.  Denies chest pain, SOB, dyspnea, cough, abnormal bleeding, and fever/chills.  Past Medical History:  Diagnosis Date  . Anxiety   . Arthritis    "qwhere" (09/03/2016)  . Asthma   . CHF (congestive heart failure) (HCC)   . Chronic back pain   . COPD (chronic obstructive pulmonary disease) (HCC)   . Coronary artery disease   . Coronary disease    Minimal nonobstructive by cardiac catheterization prior to her surgery in 2007, ejection fraction of 55% at that time.  . Daily headache    "daily but really bad @ least 2/wk" (09/03/2016)  . Fibromyalgia   . History of CHF (congestive heart failure)    Ejection fraction 45% not candidate for ICD.  Marland Kitchen History of high cholesterol   . Hypertension   . Migraine    "@ least 2/wk" (09/03/2016)  . Myocardial infarction, anterior wall (HCC)     late reperfusion occluded LAD treated with a  bare-metal stent complicated by catheter-induced left mainstem dissection and also treated with a bare-metal stent., initial LV function 25-30% followup echocardiogram ejection fraction 45%   . Panic attacks   . Rheumatic heart disease    s/p St. Jude mitral valve in 2007  . Shortness of breath   . Spina bifida (HCC)   . Stroke [facility]) 09/24/2012   TIA; "issues w/my memory since" (09/03/2016)    Past Surgical History:  Procedure Laterality Date  . APPENDECTOMY    . CARDIAC CATHETERIZATION  06/2005; 11/2005   Hattie Perch 04/08/2011   . CARDIAC VALVE REPLACEMENT  02/2006   St. Jude MVR  . CHOLECYSTECTOMY N/A 09/08/2016   Procedure: LAPAROSCOPIC CHOLECYSTECTOMY WITH INTRAOPERATIVE CHOLANGIOGRAM;  Surgeon: Ovidio Kin, MD;  Location: [facility] OR;  Service: General;  Laterality: N/A;  . CORONARY ANGIOPLASTY WITH STENT PLACEMENT  06/2010   BMS to the mLAD (culprit vessel) and L main (which dissected during the procedure), non-obs dz CFX & RCA  . MEDIAN STERNOTOMY  02/2006   for mitral valve replacement (25 mm St. Jude mechanical prosthesis.)  . TUBAL LIGATION      Allergies: Cyclobenzaprine hcl and Latex  Medications: Prior to Admission medications   Medication Sig Start Date End Date Taking? Authorizing Provider  carvedilol (COREG) 6.25 MG tablet Take 1 tablet (6.25 mg total) by mouth 2 (two) times daily with a meal. 09/12/16  Yes Shanker Levora Dredge, MD  HYDROcodone-acetaminophen (NORCO/VICODIN) 5-325 MG tablet Take 1 tablet by mouth 3 (three) times daily as needed for moderate pain. 09/12/16  Yes Maretta BeesShanker M Ghimire, MD  Melatonin 3 MG CAPS Take 3 mg by mouth at bedtime as needed (for sleep).   Yes Historical Provider, MD  promethazine (PHENERGAN) 25 MG tablet Take 25 mg by mouth 2 (two) times daily as needed for nausea or vomiting.  02/26/12  Yes Historical Provider, MD  warfarin (COUMADIN) 5 MG tablet Take 2.5 mg once on 10/21 Start taking your usual regimen on 10/22 (7.5 mg by mouth on Monday and  Thursday. Take 10mg  by mouth on all other days) Go to Dr Bonnita LevanVyas's office on 10/23 for INR check-and ask Dr Sherril CroonVyas for further dosing instructions 09/12/16  Yes Shanker Levora DredgeM Ghimire, MD  NITROSTAT 0.4 MG SL tablet DISSOLVE ONE TABLET UNDER TONGUE EVERY 5 MINUTES UP TO 3 DOSES AS NEEDED FOR CHEST PAIN Patient taking differently: DISSOLVE 0.4 MG UNDER TONGUE EVERY 5 MINUTES UP TO 3 DOSES AS NEEDED FOR CHEST PAIN 03/22/15   Jonelle SidleSamuel G McDowell, MD     Family History  Problem Relation Age of Onset  . Heart attack Father [id number]  . Heart attack Mother [id number]  . Coronary artery disease Sister     Social History   Social History  . Marital status: Divorced    Spouse name: N/A  . Number of children: N/A  . Years of education: N/A   Occupational History  . Disabled    Social History Main Topics  . Smoking status: Current Every Day Smoker    Packs/day: 0.25    Years: 38.00    Types: Cigarettes  . Smokeless tobacco: Never Used  . Alcohol use Yes     Comment: 09/03/2016 "drank some; quit after my 1st child was born in [id number]"  . Drug use: No  . Sexual activity: Not Currently    Birth control/ protection: Post-menopausal   Other Topics Concern  . None   Social History Narrative   [place]. She is currently disabled.     Review of Systems - see above  Vital Signs: BP (!) 132/52 (BP Location: Left Arm)   Pulse 65   Temp 98.1 F (36.7 C) (Oral)   Resp 16   SpO2 96%   Physical Exam 55 yo female, laying in bed, no acute distress, AAOx3 resp - clear to auscultation bilaterally CV-RRR, no murmur appreciated. GI - soft, minimal guarding, tender to palpation throughout LE- no edema Mallampati Score:     Imaging: Dg Cholangiogram Operative  Result Date: 09/08/2016 CLINICAL DATA:  Intraoperative cholangiogram. EXAM: INTRAOPERATIVE CHOLANGIOGRAM TECHNIQUE: Cholangiographic images from the C-arm fluoroscopic device were submitted for interpretation post-operatively. Please  see the procedural report for the amount of contrast and the fluoroscopy time utilized. COMPARISON:  09/03/2016 FINDINGS: Common hepatic duct and common bile duct were opacified with contrast. Contrast drains into the duodenum. No obstructive filling defects or stones. IMPRESSION: Patent biliary system.  Normal intraoperative cholangiogram. Electronically Signed   By: Richarda OverlieAdam  Henn M.D.   On: 09/08/2016 12:14   Nm Hepatobiliary Liver Func  Result Date: 09/16/2016 CLINICAL DATA:  Right upper quadrant abdominal pain, nausea/ vomiting, status post cholecystectomy on 09/08/2016, evaluate for bile leak EXAM: NUCLEAR MEDICINE HEPATOBILIARY IMAGING TECHNIQUE: Sequential images of the abdomen were obtained out to 60 minutes following intravenous administration of radiopharmaceutical. RADIOPHARMACEUTICALS:  5.2 mCi Tc-3368m  Choletec IV COMPARISON:  CT abdomen/pelvis dated 09/15/2016 FINDINGS: Normal hepatic excretion. Visualization of small bowel within 20 minutes. No radiotracer excretion within the gallbladder fossa or along the inferior margin of  the right hepatic lobe suggestive of bile leak. IMPRESSION: No findings suspicious for bile leak. Electronically Signed   By: Charline Bills M.D.   On: 09/16/2016 11:30   Ct Abdomen Pelvis W Contrast  Result Date: 09/19/2016 CLINICAL DATA:  Severe nausea, vomiting. EXAM: CT ABDOMEN AND PELVIS WITH CONTRAST TECHNIQUE: Multidetector CT imaging of the abdomen and pelvis was performed using the standard protocol following bolus administration of intravenous contrast. CONTRAST:  ISOVUE-300 IOPAMIDOL (ISOVUE-300) INJECTION 61% COMPARISON:  None. FINDINGS: Lower chest: Lung bases are clear. No effusions. Heart is normal size. Hepatobiliary: Diffuse fatty infiltration of the liver. Area of more pronounced focal fatty infiltration near the falciform ligament. Prior cholecystectomy. Again noted is air and fluid collection within the gallbladder fossa, slightly smaller than  prior study, but concerning for infected postoperative fluid collection. This currently measures 4.6 x 2.1 cm compared with 5.4 x 2.9 cm previously. Pancreas: No focal abnormality or ductal dilatation. Spleen: No focal abnormality.  Normal size. Adrenals/Urinary Tract: No adrenal abnormality. No focal renal abnormality. No stones or hydronephrosis. Urinary bladder is unremarkable. Stomach/Bowel: Stomach, large and small bowel grossly unremarkable. Vascular/Lymphatic: Diffuse aortic and iliac calcifications, advanced for patient's age. No adenopathy. Reproductive: Uterus and adnexa unremarkable.  No mass. Other: No free fluid or free air. Musculoskeletal: No acute bony abnormality or focal bone lesion. IMPRESSION: Continued fluid and gas collection within the gallbladder fossa concerning for infected postoperative fluid collection. This is slightly smaller when compared to prior study. Diffuse fatty infiltration of the liver. Electronically Signed   By: Charlett Nose M.D.   On: 09/19/2016 13:19   Nm Myocar Multi W/spect W/wall Motion / Ef  Result Date: 09/05/2016 CLINICAL DATA:  Chest pain. Shortness of breath. Congestive heart failure. COPD. Previous myocardial infarct and stroke. Preop evaluation. EXAM: MYOCARDIAL IMAGING WITH SPECT (REST AND PHARMACOLOGIC-STRESS) GATED LEFT VENTRICULAR WALL MOTION STUDY LEFT VENTRICULAR EJECTION FRACTION TECHNIQUE: Standard myocardial SPECT imaging was performed after resting intravenous injection of 10 mCi Tc-56m tetrofosmin. Subsequently, intravenous infusion of Lexiscan was performed under the supervision of the Cardiology staff. At peak effect of the drug, 30 mCi Tc-41m tetrofosmin was injected intravenously and standard myocardial SPECT imaging was performed. Quantitative gated imaging was also performed to evaluate left ventricular wall motion, and estimate left ventricular ejection fraction. COMPARISON:  None. FINDINGS: Perfusion: Extensive decreased myocardial activity  is seen on both the stress and resting images involving the anterior, anteroseptal, and apical walls of the left ventricle, consistent with myocardial infarct. No areas of reversibility are seen to suggest the presence of inducible myocardial ischemia. Wall Motion: Moderate left ventricular dilatation. Global left ventricular hypokinesis. Left Ventricular Ejection Fraction: 6 % End diastolic volume 232 ml End systolic volume 217 ml IMPRESSION: 1. Extensive myocardial infarction involving the anterior, anteroseptal, and apical walls. No evidence of inducible ischemia. 2. Global left ventricular hypokinesis and left ventricular dilatation. 3. Left ventricular ejection fraction 6% 4. Non invasive risk stratification*: High *2012 Appropriate Use Criteria for Coronary Revascularization Focused Update: J Am Coll Cardiol. 2012;59(9):857-881. http://content.dementiazones.com.aspx?articleid=[email or web address] Electronically Signed   By: Myles Rosenthal M.D.   On: 09/05/2016 14:01    Labs:  CBC:  Recent Labs  09/17/16 0747 09/18/16 0552 09/19/16 0703 09/20/16 0310  WBC 11.5* 9.7 10.6* 10.9*  HGB 13.8 12.9 14.0 14.0  HCT 40.9 38.7 43.0 42.0  PLT 359 309 346 366    COAGS:  Recent Labs  09/04/16 0515  09/15/16 2224  09/17/16 0747 09/18/16 0552 09/19/16 0703 09/20/16 0310  INR 1.83  < > 3.08  < > 3.19 2.99 2.29 1.82  APTT 92*  --  51*  --   --   --   --   --   < > = values in this interval not displayed.  BMP:  Recent Labs  09/11/16 0450 09/15/16 2224 09/16/16 1135 09/18/16 0552  NA 140 141 137 137  K 3.8 4.2 3.7 4.6  CL 105 107 104 105  CO2 27 25 25 25   GLUCOSE 96 102* 138* 114*  BUN <5* 7 7 <5*  CALCIUM 8.0* 9.0 8.7* 8.5*  CREATININE 0.86 0.90 0.88 0.91  GFRNONAA >60 >60 >60 >60  GFRAA >60 >60 >60 >60    LIVER FUNCTION TESTS:  Recent Labs  09/05/16 0509 09/06/16 0739 09/15/16 2224 09/16/16 1135  BILITOT 0.9 1.0 0.6 0.6  AST 17 19 22 30   ALT 11* 12* 20 17  ALKPHOS 56 57 119  108  PROT 5.7* 5.7* 7.1 6.5  ALBUMIN 2.7* 2.7* 3.1* 3.0*    TUMOR MARKERS: No results for input(s): AFPTM, CEA, CA199, CHROMGRNA in the last 8760 hours.  Assessment and Plan:  [name] is a 55 y.o. 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 Ezzard StandingNewman, discharged on 10/20.  Readmitted from ED on 10/23 for [facility], indicating possible post-op complications of bile leak vs infection.  CT abd on 10/27 showed fluid/gas collection in GB fossa indicating possible infection, amount was noted to be smaller [facility]. HIDA scan 10/24 negative for post-op bile leak.   This service consulted for CT guided drainage of GB fossa fluid collection. Labs reviewed.  Imaging studies reviewed by Dr. Bonnielee HaffHoss.  Heparin drip d/c in am 2hr prior to procedure.Case planned for 10/29 am. Risks and benefits discussed with the patient including bleeding, infection, damage to adjacent structures, bowel perforation/fistula connection, and sepsis. All of the patient's questions were answered, patient is agreeable to proceed. Consent signed and in chart.    Thank you for this interesting consult.  I greatly enjoyed meeting [name] and look forward to participating in their care.  A copy of this report was sent to the requesting provider on this date.  Electronically Signed: D. Jeananne RamaKevin Merly Hinkson 09/20/2016, 1:01 PM   I spent a total of 25 min in face to face in clinical consultation, greater than 50% of which was counseling/coordinating care for CT guided drainage of GB fossa fluid collection.

## 2016-09-20 NOTE — Progress Notes (Signed)
ANTICOAGULATION CONSULT NOTE - Follow Up Consult  Pharmacy Consult for Heparin (while warfarin on hold) Indication: MVR  Allergies  Allergen Reactions  . Cyclobenzaprine Hcl Nausea And Vomiting  . Latex Other (See Comments)    Unknown   Patient Measurements: Heparin Dosing Weight: 70kg  Vital Signs: Temp: 98.1 F (36.7 C) (10/28 0432) Temp Source: Oral (10/28 0432) BP: 132/52 (10/28 0432) Pulse Rate: 65 (10/28 0432)  Labs:  Recent Labs  09/18/16 0552 09/19/16 0703 09/19/16 1902 09/20/16 0310 09/20/16 1210  HGB 12.9 14.0  --  14.0  --   HCT 38.7 43.0  --  42.0  --   PLT 309 346  --  366  --   LABPROT 31.7* 25.6*  --  21.3*  --   INR 2.99 2.29  --  1.82  --   HEPARINUNFRC  --   --  <0.10* 0.38 0.92*  CREATININE 0.91  --   --   --   --     CrCl cannot be calculated (Unknown ideal weight.).   Assessment: [age] y/o F with MVR on warfarin PTA, on heparin while warfarin is on hold. INR < 2.5.   Heparin level on recheck is elevated at 0.92 (goal 0.3 to 0.7). Drawn correctly by peripheral stick. CBC remains stable. No bleeding noted.   Goal of Therapy:  Heparin level 0.3-0.7 units/ml Monitor platelets by anticoagulation protocol: Yes   Plan:  Decrease heparin to 1050 units/hr. Discussed with RN.  Repeat heparin level in 6 hours.  Heparin level and CBC daily while on therapy. Plan for IR guided drainage subhepatic fluid collection in AM.  Follow-up restart anticoagulation post-IR drainage.   Link SnufferJessica Bobbe Quilter, PharmD, BCPS Clinical Pharmacist [phone] today until 3:30PM [phone] after hours 09/20/2016,1:05 PM

## 2016-09-20 NOTE — Progress Notes (Signed)
ANTICOAGULATION CONSULT NOTE - Follow Up Consult  Pharmacy Consult for Heparin (while warfarin on hold) Indication: MVR  Allergies  Allergen Reactions  . Cyclobenzaprine Hcl Nausea And Vomiting  . Latex Other (See Comments)    Unknown   Patient Measurements: Heparin Dosing Weight: 70kg  Vital Signs: Temp: 98.2 F (36.8 C) (10/28 1506) Temp Source: Oral (10/28 1506) BP: 128/54 (10/28 1506) Pulse Rate: 72 (10/28 1506)  Labs:  Recent Labs  09/18/16 0552 09/19/16 0703  09/20/16 0310 09/20/16 1210 09/20/16 1830  HGB 12.9 14.0  --  14.0  --   --   HCT 38.7 43.0  --  42.0  --   --   PLT 309 346  --  366  --   --   LABPROT 31.7* 25.6*  --  21.3*  --   --   INR 2.99 2.29  --  1.82  --   --   HEPARINUNFRC  --   --   < > 0.38 0.92* 0.38  CREATININE 0.91  --   --   --   --   --   < > = values in this interval not displayed.  CrCl cannot be calculated (Unknown ideal weight.).   Assessment: [age] y/o F with MVR on warfarin PTA, on heparin while warfarin is on hold. INR < 2.5.   Heparin level is now therapeutic at 0.38. No bleeding noted. Heparin to be stopped 2 hours prior to IR procedure. No time noted in chart.   Goal of Therapy:  Heparin level 0.3-0.7 units/ml Monitor platelets by anticoagulation protocol: Yes   Plan:  Continue heparin at 1050 units/hr F/u AM heparin level and CBC Plan for IR guided drainage subhepatic fluid collection in AM.  Follow-up restart anticoagulation post-IR drainage.   Lysle Pearlachel Manar Smalling, PharmD, BCPS Pager # [phone] 09/20/2016 7:28 PM

## 2016-09-20 NOTE — Progress Notes (Signed)
Subjective: Continues to have abdominal pain and nausea.  One episode of vomiting.  Not getting better. Afebrile.  Heart rate 65.  Normotensive. WBC 10,900.  Hemoglobin 14.  Prothrombin time 21.3.  INR 1.82. 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  On Coumadin as outpatient.  Discussed with Dr. Lenoria FarrierArt Hoss this morning.  We'll plan IR guided drainage subhepatic fluid collection tomorrow morning.  Dr. Bonnielee HaffHoss aware of the Coumadin and heparin issues as well as the current INR. Objective: Vital signs in last 24 hours: Temp:  [98.1 F (36.7 C)-99.5 F (37.5 C)] 98.1 F (36.7 C) (10/28 0432) Pulse Rate:  [65-73] 65 (10/28 0432) Resp:  [16] 16 (10/28 0432) BP: (119-154)/(52-64) 132/52 (10/28 0432) SpO2:  [94 %-97 %] 96 % (10/28 0432) Last BM Date: 09/17/16  Intake/Output from previous day: 10/27 0701 - 10/28 0700 In: 960 [P.O.:960] Out: -  Intake/Output this shift: No intake/output data recorded.  General appearance: Alert and cooperative.  No acute distress but does not feel well.  Not toxic Resp: clear to auscultation bilaterally GI: Soft.  Subjectively tender right upper quadrant and periumbilical but no mass.  Incisions look good.  Not obviously distended.  Lab Results:   Recent Labs  09/19/16 0703 09/20/16 0310  WBC 10.6* 10.9*  HGB 14.0 14.0  HCT 43.0 42.0  PLT 346 366   BMET  Recent Labs  09/18/16 0552  NA 137  K 4.6  CL 105  CO2 25  GLUCOSE 114*  BUN <5*  CREATININE 0.91  CALCIUM 8.5*   PT/INR  Recent Labs  09/19/16 0703 09/20/16 0310  LABPROT 25.6* 21.3*  INR 2.29 1.82   ABG No results for input(s): PHART, HCO3 in the last 72 hours.  Invalid input(s): PCO2, PO2  Studies/Results: Ct Abdomen Pelvis W Contrast  Result Date: 09/19/2016 CLINICAL DATA:  Severe nausea, vomiting. EXAM: CT ABDOMEN AND PELVIS WITH CONTRAST TECHNIQUE: Multidetector CT imaging  of the abdomen and pelvis was performed using the standard protocol following bolus administration of intravenous contrast. CONTRAST:  100mL ISOVUE-300 IOPAMIDOL (ISOVUE-300) INJECTION 61% COMPARISON:  None. FINDINGS: Lower chest: Lung bases are clear. No effusions. Heart is normal size. Hepatobiliary: Diffuse fatty infiltration of the liver. Area of more pronounced focal fatty infiltration near the falciform ligament. Prior cholecystectomy. Again noted is air and fluid collection within the gallbladder fossa, slightly smaller than prior study, but concerning for infected postoperative fluid collection. This currently measures 4.6 x 2.1 cm compared with 5.4 x 2.9 cm previously. Pancreas: No focal abnormality or ductal dilatation. Spleen: No focal abnormality.  Normal size. Adrenals/Urinary Tract: No adrenal abnormality. No focal renal abnormality. No stones or hydronephrosis. Urinary bladder is unremarkable. Stomach/Bowel: Stomach, large and small bowel grossly unremarkable. Vascular/Lymphatic: Diffuse aortic and iliac calcifications, advanced for patient's age. No adenopathy. Reproductive: Uterus and adnexa unremarkable.  No mass. Other: No free fluid or free air. Musculoskeletal: No acute bony abnormality or focal bone lesion. IMPRESSION: Continued fluid and gas collection within the gallbladder fossa concerning for infected postoperative fluid collection. This is slightly smaller when compared to prior study. Diffuse fatty infiltration of the liver. Electronically Signed   By: Charlett NoseKevin  Dover Sweeney.D.   On: 09/19/2016 13:19    Anti-infectives: Anti-infectives    Start     Dose/Rate Route Frequency Ordered Stop   09/15/16 2230  ciprofloxacin (CIPRO) IVPB 400 mg  Status:  Discontinued     400  mg 200 mL/hr over 60 Minutes Intravenous Every 12 hours 09/15/16 2215 09/17/16 1249      Assessment/Plan:   Abdominal pain  Nausea/vomiting/loose stools S/p laparoscopic cholecystectomy 09/08/16, Dr. Ezzard StandingNewman - CT  [facility] for possible biloma of GB fossa and [facility]  - fluid collection ~3 x 5 cm  -Slightly smaller on CT scan yesterday - HIDA scan negative for postoperative bile leak - leukocytosis resolved 10/26, then recurred this AM  - WBC 10.9  - 10/25 pepto bismol and pepcid added to improve nausea. -Clinically not improving. - [name] and left main stent on coumadin - INR 2.29 - Heparin protocol.  -Discussed care with Dr. Bonnielee HaffHoss in interventional radiology at this hour.  He is aware of the heparin drip and the cardiac issues.  Will continue heparin drip and allow INR to drift down a little more and plan drainage procedure tomorrow.  FEN: NPO ID: ciprofloxacin day 10/23-10/25, she had 7 days of Zosyn 10/13-10/19/17  VTE: heparin per pharmacy, INR 3.19 - SCD's    LOS: 5 days    [name],[name] 09/20/2016

## 2016-09-20 NOTE — Progress Notes (Signed)
ANTICOAGULATION CONSULT NOTE - Follow Up Consult  Pharmacy Consult for Heparin (while warfarin on hold) Indication: MVR  Allergies  Allergen Reactions  . Cyclobenzaprine Hcl Nausea And Vomiting  . Latex Other (See Comments)    Unknown     Vital Signs: Temp: 98.1 F (36.7 C) (10/28 0432) Temp Source: Oral (10/28 0432) BP: 132/52 (10/28 0432) Pulse Rate: 65 (10/28 0432)  Labs:  Recent Labs  09/17/16 0747 09/18/16 0552 09/19/16 0703 09/19/16 1902 09/20/16 0310  HGB 13.8 12.9 14.0  --  14.0  HCT 40.9 38.7 43.0  --  42.0  PLT 359 309 346  --  366  LABPROT 33.4* 31.7* 25.6*  --   --   INR 3.19 2.99 2.29  --   --   HEPARINUNFRC  --   --   --  <0.10* 0.38  CREATININE  --  0.91  --   --   --     CrCl cannot be calculated (Unknown ideal weight.).   Assessment: 55 y/o F with MVR on warfarin PTA, on heparin while warfarin is on hold, heparin level this AM is therapeutic  Goal of Therapy:  Heparin level 0.3-0.7 units/ml Monitor platelets by anticoagulation protocol: Yes   Plan:  -Cont heparin at 1200 units/hr -1200 HL  [name] 09/20/2016,4:58 AM

## 2016-09-21 ENCOUNTER — Inpatient Hospital Stay (HOSPITAL_COMMUNITY): Payer: Medicaid Other

## 2016-09-21 LAB — BASIC METABOLIC PANEL
ANION GAP: 10 (ref 5–15)
CO2: 24 mmol/L (ref 22–32)
Calcium: 8.8 mg/dL — ABNORMAL LOW (ref 8.9–10.3)
Chloride: 101 mmol/L (ref 101–111)
Creatinine, Ser: 0.76 mg/dL (ref 0.44–1.00)
Glucose, Bld: 121 mg/dL — ABNORMAL HIGH (ref 65–99)
POTASSIUM: 4 mmol/L (ref 3.5–5.1)
SODIUM: 135 mmol/L (ref 135–145)

## 2016-09-21 LAB — CBC
HCT: 41.4 % (ref 36.0–46.0)
Hemoglobin: 14 g/dL (ref 12.0–15.0)
MCH: 32.6 pg (ref 26.0–34.0)
MCHC: 33.8 g/dL (ref 30.0–36.0)
MCV: 96.5 fL (ref 78.0–100.0)
PLATELETS: 303 10*3/uL (ref 150–400)
RBC: 4.29 MIL/uL (ref 3.87–5.11)
RDW: 13.7 % (ref 11.5–15.5)
WBC: 12 10*3/uL — AB (ref 4.0–10.5)

## 2016-09-21 LAB — PROTIME-INR
INR: 1.61
PROTHROMBIN TIME: 19.3 s — AB (ref 11.4–15.2)

## 2016-09-21 LAB — HEPARIN LEVEL (UNFRACTIONATED)
HEPARIN UNFRACTIONATED: 0.6 [IU]/mL (ref 0.30–0.70)
HEPARIN UNFRACTIONATED: 0.46 [IU]/mL (ref 0.30–0.70)

## 2016-09-21 MED ORDER — LIDOCAINE HCL 1 % IJ SOLN
INTRAMUSCULAR | Status: AC
  Filled 2016-09-21: qty 20

## 2016-09-21 MED ORDER — MIDAZOLAM HCL 2 MG/2ML IJ SOLN
INTRAMUSCULAR | Status: AC
  Filled 2016-09-21: qty 4

## 2016-09-21 MED ORDER — MIDAZOLAM HCL 2 MG/2ML IJ SOLN
INTRAMUSCULAR | Status: AC | PRN
  Administered 2016-09-21 (×2): 1 mg via INTRAVENOUS

## 2016-09-21 MED ORDER — FENTANYL CITRATE (PF) 100 MCG/2ML IJ SOLN
INTRAMUSCULAR | Status: AC | PRN
  Administered 2016-09-21 (×2): 50 ug via INTRAVENOUS

## 2016-09-21 MED ORDER — FENTANYL CITRATE (PF) 100 MCG/2ML IJ SOLN
INTRAMUSCULAR | Status: AC
  Filled 2016-09-21: qty 4

## 2016-09-21 NOTE — Sedation Documentation (Signed)
Patient denies pain and is resting comfortably.  

## 2016-09-21 NOTE — Progress Notes (Addendum)
ANTICOAGULATION CONSULT NOTE - Follow Up Consult  Pharmacy Consult for Heparin (while warfarin on hold) Indication: MVR  Allergies  Allergen Reactions  . Cyclobenzaprine Hcl Nausea And Vomiting  . Latex Other (See Comments)    Unknown   Patient Measurements: Heparin Dosing Weight: 70kg  Vital Signs: Temp: 98.4 F (36.9 C) (10/29 1404) Temp Source: Oral (10/29 1404) BP: 126/68 (10/29 1404) Pulse Rate: 72 (10/29 1404)  Labs:  Recent Labs  09/19/16 0703  09/20/16 0310  09/20/16 1830 09/21/16 0153 09/21/16 1930  HGB 14.0  --  14.0  --   --  14.0  --   HCT 43.0  --  42.0  --   --  41.4  --   PLT 346  --  366  --   --  303  --   LABPROT 25.6*  --  21.3*  --   --  19.3*  --   INR 2.29  --  1.82  --   --  1.61  --   HEPARINUNFRC  --   < > 0.38  < > 0.38 0.60 0.46  CREATININE  --   --   --   --   --  0.76  --   < > = values in this interval not displayed.  CrCl cannot be calculated (Unknown ideal weight.).   Assessment: 55 y/o F with MVR on warfarin PTA, on heparin while warfarin is on hold. INR 1.61  Pharmacy ok'd to resumed heparin post-IR this afternoon per discussion with Dr. Bonnielee HaffHoss (IR). Heparin level remains therapeutic (0.46) on previous rate of 1050 units/h. CBC, no bleed documented.  Goal of Therapy:  Heparin level 0.3-0.7 units/ml Monitor platelets by anticoagulation protocol: Yes   Plan:  Continue heparin at 1050 units/h Repeat heparin level in 6 hours to confirm Heparin level and CBC daily while on therapy Monitor for s/sx bleeding Follow-up ability to restart Coumadin therapy   Babs BertinHaley Amritha Yorke, PharmD, BCPS Clinical Pharmacist 09/21/2016 8:05 PM

## 2016-09-21 NOTE — Progress Notes (Signed)
Subjective: Percutaneous drain placed in subhepatic gallbladder fossa fluid collection this morning.  Serosanguineous fluid returned.  She has less pain and feels better. WBC this morning 12,000.  Hemoglobin 14.  Creatinine 0.76.  Potassium 4.0.  INR 1.61.  Prothrombin time 19.3. 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  On Coumadin as outpatient.   Objective: Vital signs in last 24 hours: Temp:  [98.2 F (36.8 C)-99.2 F (37.3 C)] 98.4 F (36.9 C) (10/29 0522) Pulse Rate:  [66-79] 70 (10/29 1146) Resp:  [14-16] 16 (10/29 1146) BP: (116-155)/(54-74) 119/74 (10/29 1146) SpO2:  [91 %-97 %] 95 % (10/29 1146) Last BM Date: 09/20/16  Intake/Output from previous day: 10/28 0701 - 10/29 0700 In: 940.5 [I.V.:940.5] Out: -  Intake/Output this shift: Total I/O In: -  Out: 2 [Drains:2]  General appearance: Alert.  Mental status to all.  Looks like she feels better. Resp: clear to auscultation bilaterally GI: Drain right upper quadrant with serosanguineous, slightly rust-colored fluid.  Abdomen softer.  Still a little tender in mid and upper abdomen but less so  Lab Results:   Recent Labs  09/20/16 0310 09/21/16 0153  WBC 10.9* 12.0*  HGB 14.0 14.0  HCT 42.0 41.4  PLT 366 303   BMET  Recent Labs  09/21/16 0153  NA 135  K 4.0  CL 101  CO2 24  GLUCOSE 121*  BUN <5*  CREATININE 0.76  CALCIUM 8.8*   PT/INR  Recent Labs  09/20/16 0310 09/21/16 0153  LABPROT 21.3* 19.3*  INR 1.82 1.61   ABG No results for input(s): PHART, HCO3 in the last 72 hours.  Invalid input(s): PCO2, PO2  Studies/Results: Ct Image Guided Drainage By Percutaneous Catheter  Result Date: 09/21/2016 INDICATION: Gallbladder fossa abscess EXAM: CT GUIDED DRAINAGE OF GALLBLADDER FOSSA ABSCESS MEDICATIONS: The patient is currently admitted to the hospital and receiving intravenous antibiotics. The antibiotics were  administered within an appropriate time frame prior to the initiation of the procedure. ANESTHESIA/SEDATION: Two mg IV Versed 100 mcg IV Fentanyl Moderate Sedation Time:  10 The patient was continuously monitored during the procedure by the interventional radiology nurse under my direct supervision. COMPLICATIONS: None immediate. TECHNIQUE: Informed written consent was obtained from the patient after a thorough discussion of the procedural risks, benefits and alternatives. All questions were addressed. Maximal Sterile Barrier Technique was utilized including caps, mask, sterile gowns, sterile gloves, sterile drape, hand hygiene and skin antiseptic. A timeout was performed prior to the initiation of the procedure. Latex free gloves were utilized. PROCEDURE: The right flank was prepped with ChloraPrep in a sterile fashion, and a sterile drape was applied covering the operative field. A sterile gown and sterile gloves were used for the procedure. Local anesthesia was provided with 1% Lidocaine. Under CT guidance, an 18 gauge needle was advanced into the gallbladder fossa via transhepatic inter costal approach. It was removed over an Amplatz. A 10 French dilator followed by a 10 [place] drain were inserted. It was looped and string fixed then sewn to the skin. Serosanguineous fluid was aspirated. FINDINGS: Imaging confirms 8410 French drain placement in the gallbladder fossa abscess. IMPRESSION: Successful 10 French gallbladder false abscess drain yielding serosanguineous fluid. Electronically Signed   By: Jolaine ClickArthur  Hoss M.D.   On: 09/21/2016 11:58    Anti-infectives: Anti-infectives    Start     Dose/Rate Route Frequency Ordered Stop   09/15/16 2230  ciprofloxacin (CIPRO) IVPB  400 mg  Status:  Discontinued     400 mg 200 mL/hr over 60 Minutes Intravenous Every 12 hours 09/15/16 2215 09/17/16 1249      Assessment/Plan:   Abdominal pain, probably secondary to some subhepatic fluid collection Improved this  afternoon following percutaneous drainage this morning Check cultures Advance diet  S/p laparoscopic cholecystectomy 09/08/16, Dr. Ezzard StandingNewman - HIDA scan negative for postoperative bile leak - leukocytosis 12,000 - 10/25 pepto bismol and pepcid added to improve nausea.   [name] and left main stent on coumadin - INR 2.29- Heparin protocol.   Restart heparin protocol this afternoon  If stable and improving tomorrow, restart Coumadin  FEN: NPO ID: ciprofloxacin day 10/23-10/25, she had 7 days of Zosyn 10/13-10/19/17  VTE: heparin per pharmacy,- SCD's    LOS: 6 days    Calil Amor M 09/21/2016

## 2016-09-21 NOTE — Procedures (Signed)
GB fossa 10 Fr drain Serosanguinous fluid No comp/EBL

## 2016-09-21 NOTE — Progress Notes (Signed)
ANTICOAGULATION CONSULT NOTE - Follow Up Consult  Pharmacy Consult for Heparin (while warfarin on hold) Indication: MVR  Allergies  Allergen Reactions  . Cyclobenzaprine Hcl Nausea And Vomiting  . Latex Other (See Comments)    Unknown   Patient Measurements: Heparin Dosing Weight: 70kg  Vital Signs: Temp: 98.4 F (36.9 C) (10/29 0522) Temp Source: Oral (10/29 0522) BP: 119/74 (10/29 1146) Pulse Rate: 70 (10/29 1146)  Labs:  Recent Labs  09/19/16 0703  09/20/16 0310 09/20/16 1210 09/20/16 1830 09/21/16 0153  HGB 14.0  --  14.0  --   --  14.0  HCT 43.0  --  42.0  --   --  41.4  PLT 346  --  366  --   --  303  LABPROT 25.6*  --  21.3*  --   --  19.3*  INR 2.29  --  1.82  --   --  1.61  HEPARINUNFRC  --   < > 0.38 0.92* 0.38 0.60  CREATININE  --   --   --   --   --  0.76  < > = values in this interval not displayed.  CrCl cannot be calculated (Unknown ideal weight.).   Assessment: 55 y/o F with MVR on warfarin PTA, on heparin while warfarin is on hold. INR < 2.5.   Post-IR and discussed with Dr. Bonnielee HaffHoss (IR), ok to resume heparin now.   Goal of Therapy:  Heparin level 0.3-0.7 units/ml Monitor platelets by anticoagulation protocol: Yes   Plan:  Resume heparin at 1050 units/hr now.  Repeat heparin level in 6 hours.  Heparin level and CBC daily while on therapy. Follow-up ability to restart Coumadin therapy.   Link SnufferJessica Torri Michalski, PharmD, BCPS Clinical Pharmacist [phone] today until 3:30PM [phone] after hours 09/21/2016,1:56 PM

## 2016-09-22 LAB — CBC
HCT: 41.3 % (ref 36.0–46.0)
Hemoglobin: 13.9 g/dL (ref 12.0–15.0)
MCH: 32.4 pg (ref 26.0–34.0)
MCHC: 33.7 g/dL (ref 30.0–36.0)
MCV: 96.3 fL (ref 78.0–100.0)
PLATELETS: 309 10*3/uL (ref 150–400)
RBC: 4.29 MIL/uL (ref 3.87–5.11)
RDW: 13.5 % (ref 11.5–15.5)
WBC: 14.1 10*3/uL — AB (ref 4.0–10.5)

## 2016-09-22 LAB — PROTIME-INR
INR: 1.57
PROTHROMBIN TIME: 18.9 s — AB (ref 11.4–15.2)

## 2016-09-22 LAB — HEPARIN LEVEL (UNFRACTIONATED): Heparin Unfractionated: 0.42 IU/mL (ref 0.30–0.70)

## 2016-09-22 MED ORDER — WARFARIN SODIUM 5 MG PO TABS
10.0000 mg | ORAL_TABLET | Freq: Once | ORAL | Status: AC
  Administered 2016-09-22: 10 mg via ORAL
  Filled 2016-09-22: qty 2

## 2016-09-22 MED ORDER — ACETAMINOPHEN 325 MG PO TABS: 650.0000 mg | ORAL_TABLET | Freq: Four times a day (QID) | ORAL | Status: DC | PRN

## 2016-09-22 MED ORDER — DOCUSATE SODIUM 100 MG PO CAPS
100.0000 mg | ORAL_CAPSULE | Freq: Two times a day (BID) | ORAL | Status: DC
  Administered 2016-09-22: 100 mg via ORAL
  Filled 2016-09-22: qty 1

## 2016-09-22 MED ORDER — LOPERAMIDE HCL 2 MG PO CAPS
4.0000 mg | ORAL_CAPSULE | ORAL | Status: DC | PRN
  Filled 2016-09-22: qty 2

## 2016-09-22 MED ORDER — WARFARIN SODIUM 7.5 MG PO TABS: 7.5000 mg | ORAL_TABLET | ORAL | Status: DC

## 2016-09-22 MED ORDER — SENNA 8.6 MG PO TABS
1.0000 | ORAL_TABLET | Freq: Two times a day (BID) | ORAL | Status: DC
  Administered 2016-09-22: 8.6 mg via ORAL
  Filled 2016-09-22: qty 1

## 2016-09-22 MED ORDER — HYDROMORPHONE HCL 2 MG/ML IJ SOLN
1.0000 mg | INTRAMUSCULAR | Status: DC | PRN
  Administered 2016-09-22 – 2016-09-23 (×5): 1 mg via INTRAVENOUS
  Filled 2016-09-22 (×5): qty 1

## 2016-09-22 MED ORDER — WARFARIN - PHARMACIST DOSING INPATIENT
Freq: Every day | Status: DC
  Administered 2016-10-03: 17:00:00

## 2016-09-22 MED ORDER — OXYCODONE HCL 5 MG PO TABS
5.0000 mg | ORAL_TABLET | ORAL | Status: DC | PRN
  Administered 2016-09-22 – 2016-09-23 (×4): 5 mg via ORAL
  Filled 2016-09-22 (×4): qty 1

## 2016-09-22 NOTE — Progress Notes (Signed)
Chief Complaint: Patient was seen today for follow up of GB fossa drain  Referring Physician(s): Dr. Claud KelpHaywood Ingram  Supervising Physician: Richarda OverlieHenn, Adam  [facility]  Subjective: S/p perc drain to GB fossa yesterday Feeling better today C/o some diarrhea  Objective: Physical Exam: BP (!) 165/65   Pulse 69   Temp 98.2 F (36.8 C) (Oral)   Resp 16   SpO2 97%  RUQ drain intact, thin serosanguinous fluid output   Current Facility-Administered Medications:  .  acetaminophen (TYLENOL) tablet 650 mg, 650 mg, Oral, Q6H PRN, Berna Buehelsea A Connor, MD .  ALPRAZolam Prudy Feeler(XANAX) tablet 0.5 mg, 0.5 mg, Oral, QHS PRN, Harriette Bouillonhomas Cornett, MD, 0.5 mg at 09/21/16 2331 .  atorvastatin (LIPITOR) tablet 20 mg, 20 mg, Oral, q1800, Harriette Bouillonhomas Cornett, MD, 20 mg at 09/21/16 1743 .  bismuth subsalicylate (PEPTO BISMOL) 262 MG/15ML suspension 30 mL, 30 mL, Oral, Q4H PRN, Sherrie GeorgeWillard Jennings, PA-C .  carvedilol (COREG) tablet 6.25 mg, 6.25 mg, Oral, BID WC, Harriette Bouillonhomas Cornett, MD, 6.25 mg at 09/22/16 0911 .  docusate sodium (COLACE) capsule 100 mg, 100 mg, Oral, BID, Berna Buehelsea A Connor, MD, 100 mg at 09/22/16 0910 .  enalapril (VASOTEC) tablet 5 mg, 5 mg, Oral, BID, Harriette Bouillonhomas Cornett, MD, 5 mg at 09/22/16 0910 .  famotidine (PEPCID) tablet 20 mg, 20 mg, Oral, BID, Sherrie GeorgeWillard Jennings, PA-C, 20 mg at 09/22/16 0912 .  heparin ADULT infusion 100 units/mL (25000 units/22450mL sodium chloride 0.45%), 1,050 Units/hr, Intravenous, Continuous, Quenton FetterJessica B Millen, RPH, Last Rate: 10.5 mL/hr at 09/22/16 0638, 1,050 Units/hr at 09/22/16 0638 .  HYDROmorphone (DILAUDID) injection 1 mg, 1 mg, Intravenous, Q4H PRN, Berna Buehelsea A Connor, MD, 1 mg at 09/22/16 0944 .  loperamide (IMODIUM) capsule 2 mg, 2 mg, Oral, PRN, Francine GravenElizabeth S Simaan, PA-C, 2 mg at 09/21/16 0836 .  nitroGLYCERIN (NITROSTAT) SL tablet 0.4 mg, 0.4 mg, Sublingual, Q5 min PRN, Harriette Bouillonhomas Cornett, MD .  ondansetron (ZOFRAN-ODT) disintegrating tablet 4 mg, 4 mg, Oral, Q6H PRN  **OR** ondansetron (ZOFRAN) injection 4 mg, 4 mg, Intravenous, Q6H PRN, Harriette Bouillonhomas Cornett, MD, 4 mg at 09/22/16 0944 .  oxyCODONE (Oxy IR/ROXICODONE) immediate release tablet 5 mg, 5 mg, Oral, Q4H PRN, Berna Buehelsea A Connor, MD .  promethazine (PHENERGAN) injection 6.25 mg, 6.25 mg, Intravenous, Q6H PRN, Adam PhenixElizabeth S Simaan, PA-C, 6.25 mg at 09/22/16 [id number] .  senna (SENOKOT) tablet 8.6 mg, 1 tablet, Oral, BID, Berna Buehelsea A Connor, MD, 8.6 mg at 09/22/16 0912 .  warfarin (COUMADIN) tablet 10 mg, 10 mg, Oral, ONCE-1800, Gerilyn Nestlehuy D Dang, RPH .  Warfarin - Pharmacist Dosing Inpatient, , Does not apply, q1800, Thuy D Dang, RPH .  zolpidem (AMBIEN) tablet 5 mg, 5 mg, Oral, QHS PRN, Harriette Bouillonhomas Cornett, MD  Labs: CBC  Recent Labs  09/21/16 0153 09/22/16 0100  WBC 12.0* 14.1*  HGB 14.0 13.9  HCT 41.4 41.3  PLT 303 309   BMET  Recent Labs  09/21/16 0153  NA 135  K 4.0  CL 101  CO2 24  GLUCOSE 121*  BUN <5*  CREATININE 0.76  CALCIUM 8.8*   LFT No results for input(s): PROT, ALBUMIN, AST, ALT, ALKPHOS, BILITOT, BILIDIR, IBILI, LIPASE in the last 72 hours. PT/INR  Recent Labs  09/21/16 0153 09/22/16 0100  LABPROT 19.3* 18.9*  INR 1.61 1.57     Studies/Results: Ct Image Guided Drainage By Percutaneous Catheter  Result Date: 09/21/2016 INDICATION: Gallbladder fossa abscess EXAM: CT GUIDED DRAINAGE OF GALLBLADDER FOSSA ABSCESS MEDICATIONS: The patient is  currently admitted to the hospital and receiving intravenous antibiotics. The antibiotics were administered within an appropriate time frame prior to the initiation of the procedure. ANESTHESIA/SEDATION: Two mg IV Versed 100 mcg IV Fentanyl Moderate Sedation Time:  10 The patient was continuously monitored during the procedure by the interventional radiology nurse under my direct supervision. COMPLICATIONS: None immediate. TECHNIQUE: Informed written consent was obtained from the patient after a thorough discussion of the procedural risks, benefits and  alternatives. All questions were addressed. Maximal Sterile Barrier Technique was utilized including caps, mask, sterile gowns, sterile gloves, sterile drape, hand hygiene and skin antiseptic. A timeout was performed prior to the initiation of the procedure. Latex free gloves were utilized. PROCEDURE: The right flank was prepped with ChloraPrep in a sterile fashion, and a sterile drape was applied covering the operative field. A sterile gown and sterile gloves were used for the procedure. Local anesthesia was provided with 1% Lidocaine. Under CT guidance, an 18 gauge needle was advanced into the gallbladder fossa via transhepatic inter costal approach. It was removed over an Amplatz. A 10 French dilator followed by a 10 [place]French drain were inserted. It was looped and string fixed then sewn to the skin. Serosanguineous fluid was aspirated. FINDINGS: Imaging confirms 4110 French drain placement in the gallbladder fossa abscess. IMPRESSION: Successful 10 French gallbladder false abscess drain yielding serosanguineous fluid. Electronically Signed   By: Jolaine ClickArthur  Hoss M.D.   On: 09/21/2016 11:58    Assessment/Plan: S/p perc drain to GB fossa fluid collection Cx pending Plans per [facility] If discharged soon, can get CT and eval in IR clinic.    LOS: 7 days   I spent a total of 15 minutes in face to face in clinical consultation, greater than 50% of which was counseling/coordinating care for GB fossa fluid collection perc drain  Aarit Kashuba PA-C 09/22/2016 11:30 AM

## 2016-09-22 NOTE — Progress Notes (Signed)
ANTICOAGULATION CONSULT NOTE - Follow Up Consult  Pharmacy Consult:  Heparin / Coumadin Indication: MVR  Allergies  Allergen Reactions  . Cyclobenzaprine Hcl Nausea And Vomiting  . Latex Other (See Comments)    Unknown   Patient Measurements: Heparin Dosing Weight: 70kg  Vital Signs: Temp: 98.2 F (36.8 C) (10/30 0407) Temp Source: Oral (10/30 0407) BP: 167/65 (10/30 0408) Pulse Rate: 69 (10/30 0407)  Labs:  Recent Labs  09/20/16 0310  09/21/16 0153 09/21/16 1930 09/22/16 0100  HGB 14.0  --  14.0  --  13.9  HCT 42.0  --  41.4  --  41.3  PLT 366  --  303  --  309  LABPROT 21.3*  --  19.3*  --  18.9*  INR 1.82  --  1.61  --  1.57  HEPARINUNFRC 0.38  < > 0.60 0.46 0.42  CREATININE  --   --  0.76  --   --   < > = values in this interval not displayed.  CrCl cannot be calculated (Unknown ideal weight.).   Assessment: 55 YOF on Coumadin PTA for history of MVR.  Patient was transitioned to IV heparin for percutaneous rain placement.  Now to resume Coumadin.  Her heparin level is therapeutic and stable.  INR is sub-therapeutic given that Coumadin has been on hold.  Home Coumadin dose: 10mg  daily except 7.5mg  on Mon/Thurs   Goal of Therapy:  Heparin level 0.3-0.7 units/ml  INR 2.5 - 3.5 Monitor platelets by anticoagulation protocol: Yes    Plan:  - Continue heparin at 1050 units/hr - Coumadin 10mg  PO today - Daily PT / INR / heparin level / CBC - Consider increasing enalapril for better BP control   Deforest Maiden D. Laney Potashang, PharmD, BCPS Pager:  [phone] 09/22/2016, 8:38 AM

## 2016-09-22 NOTE — Progress Notes (Signed)
ANTICOAGULATION CONSULT NOTE - Follow Up Consult  Pharmacy Consult for Heparin (while warfarin on hold) Indication: MVR  Allergies  Allergen Reactions  . Cyclobenzaprine Hcl Nausea And Vomiting  . Latex Other (See Comments)    Unknown     Vital Signs: Temp: 98.2 F (36.8 C) (10/29 2005) Temp Source: Oral (10/29 2005) BP: 163/79 (10/29 2005) Pulse Rate: 74 (10/29 2005)  Labs:  Recent Labs  09/20/16 0310  09/21/16 0153 09/21/16 1930 09/22/16 0100  HGB 14.0  --  14.0  --  13.9  HCT 42.0  --  41.4  --  41.3  PLT 366  --  303  --  309  LABPROT 21.3*  --  19.3*  --  18.9*  INR 1.82  --  1.61  --  1.57  HEPARINUNFRC 0.38  < > 0.60 0.46 0.42  CREATININE  --   --  0.76  --   --   < > = values in this interval not displayed.  CrCl cannot be calculated (Unknown ideal weight.).   Assessment: 55 y/o F with MVR on warfarin PTA, on heparin while warfarin is on hold, heparin level this AM is therapeutic x 2  Goal of Therapy:  Heparin level 0.3-0.7 units/ml Monitor platelets by anticoagulation protocol: Yes   Plan:  -Cont heparin at 1050 units/hr -Daily CBC/HL -Monitor for bleeding  Abran DukeLedford, Rayelynn Loyal 09/22/2016,3:27 AM

## 2016-09-22 NOTE — Progress Notes (Signed)
Subjective: Percutaneous drain placed in subhepatic gallbladder fossa fluid collection yesterday.  Serosanguineous fluid returned. Gram stain with PMNs but no organisms. She has less pain and feels better, about to eat breakfast. WBC this morning 14.1 (slightly increased).  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  On Coumadin as outpatient.   Objective: Vital signs in last 24 hours: Temp:  [98.2 F (36.8 C)-98.4 F (36.9 C)] 98.2 F (36.8 C) (10/30 0407) Pulse Rate:  [68-74] 69 (10/30 0407) Resp:  [14-16] 16 (10/29 1404) BP: (119-175)/(65-79) 167/65 (10/30 0408) SpO2:  [91 %-97 %] 97 % (10/30 0407) Last BM Date: 09/20/16  Intake/Output from previous day: 10/29 0701 - 10/30 0700 In: 205 [P.O.:200] Out: 12 [Drains:12] Intake/Output this shift: No intake/output data recorded.  General appearance: Alert.  Mental status to all.  Looks well. Resp: clear to auscultation bilaterally GI: Drain right upper quadrant with serosanguinous, slightly rust-colored fluid.  Abdomen soft.  Tender at drain site only. Lab Results:   Recent Labs  09/21/16 0153 09/22/16 0100  WBC 12.0* 14.1*  HGB 14.0 13.9  HCT 41.4 41.3  PLT 303 309   BMET  Recent Labs  09/21/16 0153  NA 135  K 4.0  CL 101  CO2 24  GLUCOSE 121*  BUN <5*  CREATININE 0.76  CALCIUM 8.8*   PT/INR  Recent Labs  09/21/16 0153 09/22/16 0100  LABPROT 19.3* 18.9*  INR 1.61 1.57   ABG No results for input(s): PHART, HCO3 in the last 72 hours.  Invalid input(s): PCO2, PO2  Studies/Results: Ct Image Guided Drainage By Percutaneous Catheter  Result Date: 09/21/2016 INDICATION: Gallbladder fossa abscess EXAM: CT GUIDED DRAINAGE OF GALLBLADDER FOSSA ABSCESS MEDICATIONS: The patient is currently admitted to the hospital and receiving intravenous antibiotics. The antibiotics were administered within an appropriate time frame prior to  the initiation of the procedure. ANESTHESIA/SEDATION: Two mg IV Versed 100 mcg IV Fentanyl Moderate Sedation Time:  10 The patient was continuously monitored during the procedure by the interventional radiology nurse under my direct supervision. COMPLICATIONS: None immediate. TECHNIQUE: Informed written consent was obtained from the patient after a thorough discussion of the procedural risks, benefits and alternatives. All questions were addressed. Maximal Sterile Barrier Technique was utilized including caps, mask, sterile gowns, sterile gloves, sterile drape, hand hygiene and skin antiseptic. A timeout was performed prior to the initiation of the procedure. Latex free gloves were utilized. PROCEDURE: The right flank was prepped with ChloraPrep in a sterile fashion, and a sterile drape was applied covering the operative field. A sterile gown and sterile gloves were used for the procedure. Local anesthesia was provided with 1% Lidocaine. Under CT guidance, an 18 gauge needle was advanced into the gallbladder fossa via transhepatic inter costal approach. It was removed over an Amplatz. A 10 French dilator followed by a 10 [place] drain were inserted. It was looped and string fixed then sewn to the skin. Serosanguineous fluid was aspirated. FINDINGS: Imaging confirms 4310 French drain placement in the gallbladder fossa abscess. IMPRESSION: Successful 10 French gallbladder false abscess drain yielding serosanguineous fluid. Electronically Signed   By: Jolaine ClickArthur  Hoss M.D.   On: 09/21/2016 11:58    Anti-infectives: Anti-infectives    Start     Dose/Rate Route Frequency Ordered Stop   09/15/16 2230  ciprofloxacin (CIPRO) IVPB 400 mg  Status:  Discontinued     400 mg 200 mL/hr over 60 Minutes Intravenous Every 12 hours  09/15/16 2215 09/17/16 1249      Assessment/Plan:   Abdominal pain, probably secondary to some subhepatic fluid collection Improved following percutaneous drainage Check cultures- NGTD Reg  diet Bowel regimen Wean IV narcotics  S/p laparoscopic cholecystectomy 09/08/16, Dr. Ezzard StandingNewman - HIDA scan negative for postoperative bile leak - leukocytosis 14,000- not currently on abx. Afebrile. Continue to monitor. - 10/25 pepto bismol and pepcid added to improve nausea.   [name] and left main stent on coumadin - INR 2.29- Heparin protocol.  Continue heparin Restart Coumadin today  ID: ciprofloxacin day 10/23-10/25, she had 7 days of Zosyn 10/13-10/19/17  VTE: heparin and coumadin per pharmacy,- SCD's    LOS: 7 days    Berna Buehelsea A Liandro Thelin 09/22/2016

## 2016-09-23 LAB — CBC
HCT: 44.7 % (ref 36.0–46.0)
Hemoglobin: 14.7 g/dL (ref 12.0–15.0)
MCH: 32.6 pg (ref 26.0–34.0)
MCHC: 32.9 g/dL (ref 30.0–36.0)
MCV: 99.1 fL (ref 78.0–100.0)
PLATELETS: 276 10*3/uL (ref 150–400)
RBC: 4.51 MIL/uL (ref 3.87–5.11)
RDW: 14 % (ref 11.5–15.5)
WBC: 13.3 10*3/uL — AB (ref 4.0–10.5)

## 2016-09-23 LAB — HEPARIN LEVEL (UNFRACTIONATED): Heparin Unfractionated: 0.64 IU/mL (ref 0.30–0.70)

## 2016-09-23 LAB — PROTIME-INR
INR: 2.12
PROTHROMBIN TIME: 24.1 s — AB (ref 11.4–15.2)

## 2016-09-23 LAB — AEROBIC CULTURE  (SUPERFICIAL SPECIMEN)

## 2016-09-23 LAB — AEROBIC CULTURE W GRAM STAIN (SUPERFICIAL SPECIMEN): Culture: NO GROWTH

## 2016-09-23 MED ORDER — BOOST / RESOURCE BREEZE PO LIQD
1.0000 | Freq: Two times a day (BID) | ORAL | Status: DC
  Administered 2016-09-23: 1 via ORAL

## 2016-09-23 MED ORDER — OXYCODONE HCL 5 MG PO TABS
5.0000 mg | ORAL_TABLET | ORAL | Status: DC | PRN
  Administered 2016-09-23 – 2016-09-25 (×6): 10 mg via ORAL
  Filled 2016-09-23 (×7): qty 2

## 2016-09-23 MED ORDER — WARFARIN SODIUM 7.5 MG PO TABS
7.5000 mg | ORAL_TABLET | Freq: Once | ORAL | Status: AC
  Administered 2016-09-23: 7.5 mg via ORAL
  Filled 2016-09-23: qty 1

## 2016-09-23 MED ORDER — CHOLESTYRAMINE 4 G PO PACK
4.0000 g | PACK | Freq: Four times a day (QID) | ORAL | Status: DC
  Administered 2016-09-23 – 2016-09-25 (×6): 4 g via ORAL
  Filled 2016-09-23 (×9): qty 1

## 2016-09-23 MED ORDER — ENSURE ENLIVE PO LIQD
237.0000 mL | Freq: Two times a day (BID) | ORAL | Status: DC
  Administered 2016-09-23 – 2016-09-25 (×3): 237 mL via ORAL

## 2016-09-23 MED ORDER — PANTOPRAZOLE SODIUM 40 MG PO TBEC
40.0000 mg | DELAYED_RELEASE_TABLET | Freq: Two times a day (BID) | ORAL | Status: DC
  Administered 2016-09-23 – 2016-09-25 (×4): 40 mg via ORAL
  Filled 2016-09-23 (×4): qty 1

## 2016-09-23 MED ORDER — HEPARIN (PORCINE) IN NACL 100-0.45 UNIT/ML-% IJ SOLN
1000.0000 [IU]/h | INTRAMUSCULAR | Status: DC
  Administered 2016-09-23: 1000 [IU]/h via INTRAVENOUS
  Filled 2016-09-23 (×2): qty 250

## 2016-09-23 MED ORDER — HYDROMORPHONE HCL 2 MG/ML IJ SOLN
1.0000 mg | INTRAMUSCULAR | Status: DC | PRN
  Administered 2016-09-23 – 2016-09-24 (×3): 1 mg via INTRAVENOUS
  Filled 2016-09-23 (×3): qty 1

## 2016-09-23 NOTE — Progress Notes (Signed)
Subjective: Ongoing dilaudid x 6 yesterday and phenergan x 3 yesterday Zofran 3 doses yesterday.  Taking some PO, having loose stools.  Still complaining of nausea, but it is better. Drainage from the IR drain is clear serous.  She has been taking allot of dilaudid and that has also  Decreased, down to 3 mg yesterday.  Objective: Vital signs in last 24 hours: Temp:  [98.2 F (36.8 C)-98.9 F (37.2 C)] 98.9 F (37.2 C) (10/31 0841) Pulse Rate:  [73-83] 83 (10/31 0841) Resp:  [16] 16 (10/31 0645) BP: (120-162)/(60-78) 151/69 (10/31 0841) SpO2:  [95 %-97 %] 95 % (10/31 0841) Last BM Date: 09/22/16 560 PO Voided x 4 Drain 10 ml yesterday Stool x 7  - one dose of Senakot yesterday Afebrile, VSS WBC 13.3 INR 2.12 Specimen Description ABSCESS ABDOMEN   Special Requests INTRO ABDOMINAL ABSCESS   Gram Stain RARE WBC PRESENT, PREDOMINANTLY PMN NO ORGANISMS SEEN   Culture NO GROWTH 2 DAYS   Report Status 09/23/2016 FINAL     Intake/Output from previous day: 10/30 0701 - 10/31 0700 In: 925 [P.O.:560; I.V.:360] Out: 10 [Drains:10] Intake/Output this shift: Total I/O In: 125 [P.O.:120; Other:5] Out: 5 [Drains:5]  General appearance: alert, cooperative, no distress and appears down. Resp: clear to auscultation bilaterally GI: soft, non-tender; bowel sounds normal; no masses,  no organomegaly and drainage from JP is clear serous fluid.  Lab Results:   Recent Labs  09/22/16 0100 09/23/16 0601  WBC 14.1* 13.3*  HGB 13.9 14.7  HCT 41.3 44.7  PLT 309 276    BMET  Recent Labs  09/21/16 0153  NA 135  K 4.0  CL 101  CO2 24  GLUCOSE 121*  BUN <5*  CREATININE 0.76  CALCIUM 8.8*   PT/INR  Recent Labs  09/22/16 0100 09/23/16 0601  LABPROT 18.9* 24.1*  INR 1.57 2.12     Recent Labs Lab 09/16/16 1135  AST 30  ALT 17  ALKPHOS 108  BILITOT 0.6  PROT 6.5  ALBUMIN 3.0*     Lipase  No results found for: LIPASE   Studies/Results: Ct Image Guided  Drainage By Percutaneous Catheter  Result Date: 09/21/2016 INDICATION: Gallbladder fossa abscess EXAM: CT GUIDED DRAINAGE OF GALLBLADDER FOSSA ABSCESS MEDICATIONS: The patient is currently admitted to the hospital and receiving intravenous antibiotics. The antibiotics were administered within an appropriate time frame prior to the initiation of the procedure. ANESTHESIA/SEDATION: Two mg IV Versed 100 mcg IV Fentanyl Moderate Sedation Time:  10 The patient was continuously monitored during the procedure by the interventional radiology nurse under my direct supervision. COMPLICATIONS: None immediate. TECHNIQUE: Informed written consent was obtained from the patient after a thorough discussion of the procedural risks, benefits and alternatives. All questions were addressed. Maximal Sterile Barrier Technique was utilized including caps, mask, sterile gowns, sterile gloves, sterile drape, hand hygiene and skin antiseptic. A timeout was performed prior to the initiation of the procedure. Latex free gloves were utilized. PROCEDURE: The right flank was prepped with ChloraPrep in a sterile fashion, and a sterile drape was applied covering the operative field. A sterile gown and sterile gloves were used for the procedure. Local anesthesia was provided with 1% Lidocaine. Under CT guidance, an 18 gauge needle was advanced into the gallbladder fossa via transhepatic inter costal approach. It was removed over an Amplatz. A 10 French dilator followed by a 10 [place] drain were inserted. It was looped and string fixed then sewn to the skin. Serosanguineous fluid was aspirated. FINDINGS:  Imaging confirms 10 French drain placement in the gallbladder fossa abscess. IMPRESSION: Successful 10 French gallbladder false abscess drain yielding serosanguineous fluid. Electronically Signed   By: Jolaine ClickArthur  Hoss M.D.   On: 09/21/2016 11:58    Medications: . atorvastatin  20 mg Oral q1800  . carvedilol  6.25 mg Oral BID WC  . docusate  sodium  100 mg Oral BID  . enalapril  5 mg Oral BID  . famotidine  20 mg Oral BID  . feeding supplement (ENSURE ENLIVE)  237 mL Oral BID BM  . senna  1 tablet Oral BID  . warfarin  7.5 mg Oral ONCE-1800  . Warfarin - Pharmacist Dosing Inpatient   Does not apply q1800   . heparin 1,000 Units/hr (09/23/16 0841)    Assessment/Plan Abdominal pain, Nausea, probably secondary to some subhepatic fluid collection S/p laparoscopic cholecystectomy 09/08/16, Dr. 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 ID:  she had 7 days of Zosyn 10/13-10/19/17  ciprofloxacin day 3  09/17/16, then discontinued. C diff negative 09/17/16 VTE: heparin per pharmacy, INR  2.12 -  SCD's     Plan:  I have ask her to try PO pain meds more frequently, and will increase dose.  She has been on Pepcid x 7 days, will switch to PPI.   She is on imodium and got 1 dose of senokot & colace yesterday, I will stop both. I have increased her Oxycodone and ask her to use it before the Dilaudid.  I am stopping the phenergan, holding the Lipitor.  Nothing growing from the drain fluid, no antibiotics for 6 days.  Will discuss with Dr. Corliss Skainssuei, ? GI consult.  Ask PT to evaluate and see how she is ambulating, she is unsure who will be home with her and appears anxious about that also.       LOS: 8 days    Aasir Daigler 09/23/2016 [phone]

## 2016-09-23 NOTE — Progress Notes (Signed)
ANTICOAGULATION CONSULT NOTE - Follow Up Consult  Pharmacy Consult:  Heparin / Coumadin Indication: MVR  Allergies  Allergen Reactions  . Cyclobenzaprine Hcl Nausea And Vomiting  . Latex Other (See Comments)    Unknown   Patient Measurements: Heparin Dosing Weight: 70kg  Vital Signs: Temp: 98.2 F (36.8 C) (10/31 0645) Temp Source: Oral (10/31 0645) BP: 120/78 (10/31 0645) Pulse Rate: 80 (10/31 0645)  Labs:  Recent Labs  09/21/16 0153 09/21/16 1930 09/22/16 0100 09/23/16 0601  HGB 14.0  --  13.9 14.7  HCT 41.4  --  41.3 44.7  PLT 303  --  309 276  LABPROT 19.3*  --  18.9* 24.1*  INR 1.61  --  1.57 2.12  HEPARINUNFRC 0.60 0.46 0.42 0.64  CREATININE 0.76  --   --   --     CrCl cannot be calculated (Unknown ideal weight.).   Assessment: 55 YOF on Coumadin PTA for history of MVR.  Patient was transitioned to IV heparin for percutaneous drain placement and Coumadin was resumed on 09/22/16.   Her heparin level is therapeutic and now trending up.  INR remains sub-therapeutic but has increased after one dose of Coumadin 10mg .  She is eating </= 25% of her meals.  Home Coumadin dose: 10mg  daily except 7.5mg  on Mon/Thurs   Goal of Therapy:  Heparin level 0.3-0.7 units/ml  INR 2.5 - 3.5 Monitor platelets by anticoagulation protocol: Yes    Plan:  - Reduce heparin gtt slightly to 1000 units/hr - Coumadin 7.5mg  PO today - Daily PT / INR / heparin level / CBC   Yorley Buch D. Laney Potashang, PharmD, BCPS Pager:  [phone] 09/23/2016, 8:12 AM

## 2016-09-23 NOTE — Progress Notes (Signed)
Initial Nutrition Assessment  DOCUMENTATION CODES:   Not applicable  INTERVENTION:  Continue Ensure Enlive po BID, each supplement provides 350 kcal and 20 grams of protein.  Provide Boost Breeze po BID, each supplement provides 250 kcal and 9 grams of protein.  Recommend obtaining new weight to fully assess weight trends.   Encourage adequate PO intake.   NUTRITION DIAGNOSIS:   Inadequate oral intake related to poor appetite as evidenced by meal completion < 50%.  GOAL:   Patient will meet greater than or equal to 90% of their needs  MONITOR:   PO intake, Supplement acceptance, Labs, Weight trends, Skin, I & O's  REASON FOR ASSESSMENT:   Malnutrition Screening Tool    ASSESSMENT:   55 y.o. 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 Ezzard StandingNewman, discharged on 10/20.  Readmitted from ED on 10/23 for [facility], indicating possible post-op complications of bile leak vs infection.  CT abd on 10/27 showed fluid/gas collection in GB fossa indicating possible infection. Percutaneous drain placed in subhepatic gallbladder fossa fluid collection 10/29.   Meal completion has been 25%. Pt reports having a decreased appetite due to pain. Pt reports eating well PTA with usual intake of at least 2-3 meals a day. Usual body weight unknown to patient. Noted no recent weight recorded. Recommend obtaining new weight to fully assess weight trends. Pt currently has Ensure ordered and would like to continue with them. RD to additionally order Boost Breeze as pt will likely tolerate a low fat nutritional supplement.   Nutrition-Focused physical exam completed. Findings are no fat depletion, mild muscle depletion, and no edema.   Labs and medications reviewed.    Diet Order:  Diet regular Room service appropriate? Yes; Fluid consistency: Thin  Skin:   (Incision on abdomen)  Last BM:  10/30  Height:   Ht Readings from Last 1 Encounters:  09/03/16 5\' 4"  (1.626 m)    Weight:   Wt Readings from Last 1 Encounters:  09/03/16 160 lb 3.2 oz (72.7 kg)    Ideal Body Weight:  54.5 kg  BMI:  There is no height or weight on file to calculate BMI.  Estimated Nutritional Needs:   Kcal:  1800-1900  Protein:  75-85 grams  Fluid:  1.8 - 1.9 L/day  EDUCATION NEEDS:   No education needs identified at this time  Roslyn SmilingStephanie Fusako Tanabe, MS, RD, LDN Pager # [phone] After hours/ weekend pager # [phone]

## 2016-09-23 NOTE — Progress Notes (Signed)
Referring Physician(s): Patient seen today for follow up GB fossa drain  Supervising Physician: Irish LackYamagata, Glenn  [facility]  [facility]  Subjective:  S/p perc drain to GB fossa 10/29 by Dr. Bonnielee HaffHoss Complains of pain around drain site, diarrhea, 1 episode vomiting  Allergies: Cyclobenzaprine hcl and Latex  Medications: Prior to Admission medications   Medication Sig Start Date End Date Taking? Authorizing Provider  carvedilol (COREG) 6.25 MG tablet Take 1 tablet (6.25 mg total) by mouth 2 (two) times daily with a meal. 09/12/16  Yes Shanker Levora DredgeM Ghimire, MD  HYDROcodone-acetaminophen (NORCO/VICODIN) 5-325 MG tablet Take 1 tablet by mouth 3 (three) times daily as needed for moderate pain. 09/12/16  Yes Shanker Levora DredgeM Ghimire, MD  Melatonin 3 MG CAPS Take 3 mg by mouth at bedtime as needed (for sleep).   Yes Historical Provider, MD  promethazine (PHENERGAN) 25 MG tablet Take 25 mg by mouth 2 (two) times daily as needed for nausea or vomiting.  02/26/12  Yes Historical Provider, MD  warfarin (COUMADIN) 5 MG tablet Take 2.5 mg once on 10/21 Start taking your usual regimen on 10/22 (7.5 mg by mouth on Monday and Thursday. Take 10mg  by mouth on all other days) Go to Dr Bonnita LevanVyas's office on 10/23 for INR check-and ask Dr Sherril CroonVyas for further dosing instructions 09/12/16  Yes Shanker Levora DredgeM Ghimire, MD  NITROSTAT 0.4 MG SL tablet DISSOLVE ONE TABLET UNDER TONGUE EVERY 5 MINUTES UP TO 3 DOSES AS NEEDED FOR CHEST PAIN Patient taking differently: DISSOLVE 0.4 MG UNDER TONGUE EVERY 5 MINUTES UP TO 3 DOSES AS NEEDED FOR CHEST PAIN 03/22/15   Jonelle SidleSamuel G McDowell, MD     Vital Signs: BP (!) 151/69 (BP Location: Right Arm)   Pulse 83   Temp 98.9 F (37.2 C) (Oral)   Resp 16   SpO2 95%   Physical Exam  Constitutional: She appears well-developed and well-nourished.  HENT:  Mouth/Throat: No oropharyngeal exudate.  Cardiovascular: Normal rate, regular rhythm and normal heart sounds.   Pulmonary/Chest: Effort  normal and breath sounds normal.  Abdominal: Soft. Bowel sounds are normal. She exhibits no distension. There is tenderness (around drain site).  Drain Site c/d/i  Skin: Skin is warm and dry.   WBC 13.3, afebrile, 10cc serosanguinous fluid 10/30, 5cc today, flushing daily No growth on culture   Imaging: Ct Abdomen Pelvis W Contrast  Result Date: 09/19/2016 CLINICAL DATA:  Severe nausea, vomiting. EXAM: CT ABDOMEN AND PELVIS WITH CONTRAST TECHNIQUE: Multidetector CT imaging of the abdomen and pelvis was performed using the standard protocol following bolus administration of intravenous contrast. CONTRAST:  100mL ISOVUE-300 IOPAMIDOL (ISOVUE-300) INJECTION 61% COMPARISON:  None. FINDINGS: Lower chest: Lung bases are clear. No effusions. Heart is normal size. Hepatobiliary: Diffuse fatty infiltration of the liver. Area of more pronounced focal fatty infiltration near the falciform ligament. Prior cholecystectomy. Again noted is air and fluid collection within the gallbladder fossa, slightly smaller than prior study, but concerning for infected postoperative fluid collection. This currently measures 4.6 x 2.1 cm compared with 5.4 x 2.9 cm previously. Pancreas: No focal abnormality or ductal dilatation. Spleen: No focal abnormality.  Normal size. Adrenals/Urinary Tract: No adrenal abnormality. No focal renal abnormality. No stones or hydronephrosis. Urinary bladder is unremarkable. Stomach/Bowel: Stomach, large and small bowel grossly unremarkable. Vascular/Lymphatic: Diffuse aortic and iliac calcifications, advanced for patient's age. No adenopathy. Reproductive: Uterus and adnexa unremarkable.  No mass. Other: No free fluid or free air. Musculoskeletal: No acute bony abnormality or  focal bone lesion. IMPRESSION: Continued fluid and gas collection within the gallbladder fossa concerning for infected postoperative fluid collection. This is slightly smaller when compared to prior study. Diffuse fatty  infiltration of the liver. Electronically Signed   By: Charlett Nose M.D.   On: 09/19/2016 13:19   Ct Image Guided Drainage By Percutaneous Catheter  Result Date: 09/21/2016 INDICATION: Gallbladder fossa abscess EXAM: CT GUIDED DRAINAGE OF GALLBLADDER FOSSA ABSCESS MEDICATIONS: The patient is currently admitted to the hospital and receiving intravenous antibiotics. The antibiotics were administered within an appropriate time frame prior to the initiation of the procedure. ANESTHESIA/SEDATION: Two mg IV Versed 100 mcg IV Fentanyl Moderate Sedation Time:  10 The patient was continuously monitored during the procedure by the interventional radiology nurse under my direct supervision. COMPLICATIONS: None immediate. TECHNIQUE: Informed written consent was obtained from the patient after a thorough discussion of the procedural risks, benefits and alternatives. All questions were addressed. Maximal Sterile Barrier Technique was utilized including caps, mask, sterile gowns, sterile gloves, sterile drape, hand hygiene and skin antiseptic. A timeout was performed prior to the initiation of the procedure. Latex free gloves were utilized. PROCEDURE: The right flank was prepped with ChloraPrep in a sterile fashion, and a sterile drape was applied covering the operative field. A sterile gown and sterile gloves were used for the procedure. Local anesthesia was provided with 1% Lidocaine. Under CT guidance, an 18 gauge needle was advanced into the gallbladder fossa via transhepatic inter costal approach. It was removed over an Amplatz. A 10 French dilator followed by a 10 [place] drain were inserted. It was looped and string fixed then sewn to the skin. Serosanguineous fluid was aspirated. FINDINGS: Imaging confirms 88 French drain placement in the gallbladder fossa abscess. IMPRESSION: Successful 10 French gallbladder false abscess drain yielding serosanguineous fluid. Electronically Signed   By: Jolaine Click M.D.   On:  09/21/2016 11:58    Labs:  CBC:  Recent Labs  09/20/16 0310 09/21/16 0153 09/22/16 0100 09/23/16 0601  WBC 10.9* 12.0* 14.1* 13.3*  HGB 14.0 14.0 13.9 14.7  HCT 42.0 41.4 41.3 44.7  PLT 366 303 309 276    COAGS:  Recent Labs  09/04/16 0515  09/15/16 2224  09/20/16 0310 09/21/16 0153 09/22/16 0100 09/23/16 0601  INR 1.83  < > 3.08  < > 1.82 1.61 1.57 2.12  APTT 92*  --  51*  --   --   --   --   --   < > = values in this interval not displayed.  BMP:  Recent Labs  09/15/16 2224 09/16/16 1135 09/18/16 0552 09/21/16 0153  NA 141 137 137 135  K 4.2 3.7 4.6 4.0  CL 107 104 105 101  CO2 25 25 25 24   GLUCOSE 102* 138* 114* 121*  BUN 7 7 <5* <5*  CALCIUM 9.0 8.7* 8.5* 8.8*  CREATININE 0.90 0.88 0.91 0.76  GFRNONAA >60 >60 >60 >60  GFRAA >60 >60 >60 >60    LIVER FUNCTION TESTS:  Recent Labs  09/05/16 0509 09/06/16 0739 09/15/16 2224 09/16/16 1135  BILITOT 0.9 1.0 0.6 0.6  AST 17 19 22 30   ALT 11* 12* 20 17  ALKPHOS 56 57 119 108  PROT 5.7* 5.7* 7.1 6.5  ALBUMIN 2.7* 2.7* 3.1* 3.0*    Assessment and Plan:  S/p perc GB fossa drain 10/29 -follow daily  -plans per [facility] If home with drain follow up in drain clinic 7-10 days  Electronically Signed: Ralene Muskrat A 09/23/2016,  11:17 AM   I spent a total of 15 Minutes at the the patient's bedside AND on the patient's hospital floor or unit, greater than 50% of which was counseling/coordinating care for perc GB fossa drain.

## 2016-09-24 ENCOUNTER — Inpatient Hospital Stay (HOSPITAL_COMMUNITY): Payer: Medicaid Other

## 2016-09-24 LAB — CBC
HCT: 49.1 % — ABNORMAL HIGH (ref 36.0–46.0)
HEMOGLOBIN: 16.9 g/dL — AB (ref 12.0–15.0)
MCH: 32.8 pg (ref 26.0–34.0)
MCHC: 34.4 g/dL (ref 30.0–36.0)
MCV: 95.2 fL (ref 78.0–100.0)
PLATELETS: 335 10*3/uL (ref 150–400)
RBC: 5.16 MIL/uL — AB (ref 3.87–5.11)
RDW: 13.6 % (ref 11.5–15.5)
WBC: 15.8 10*3/uL — ABNORMAL HIGH (ref 4.0–10.5)

## 2016-09-24 LAB — PROTIME-INR
INR: 3.98
Prothrombin Time: 39.9 seconds — ABNORMAL HIGH (ref 11.4–15.2)

## 2016-09-24 LAB — URINALYSIS, ROUTINE W REFLEX MICROSCOPIC
GLUCOSE, UA: NEGATIVE mg/dL
KETONES UR: 15 mg/dL — AB
LEUKOCYTES UA: NEGATIVE
Nitrite: NEGATIVE
PH: 5.5 (ref 5.0–8.0)
Specific Gravity, Urine: 1.031 — ABNORMAL HIGH (ref 1.005–1.030)

## 2016-09-24 LAB — URINE MICROSCOPIC-ADD ON

## 2016-09-24 LAB — COMPREHENSIVE METABOLIC PANEL
ALBUMIN: 3.9 g/dL (ref 3.5–5.0)
ALK PHOS: 96 U/L (ref 38–126)
ALT: 27 U/L (ref 14–54)
ANION GAP: 15 (ref 5–15)
AST: 38 U/L (ref 15–41)
BILIRUBIN TOTAL: 0.9 mg/dL (ref 0.3–1.2)
BUN: 12 mg/dL (ref 6–20)
CALCIUM: 10.1 mg/dL (ref 8.9–10.3)
CO2: 21 mmol/L — AB (ref 22–32)
CREATININE: 1.17 mg/dL — AB (ref 0.44–1.00)
Chloride: 98 mmol/L — ABNORMAL LOW (ref 101–111)
GFR calc Af Amer: >60 mL/min (ref >60)
GFR calc non Af Amer: 52 mL/min — ABNORMAL LOW (ref >60)
GLUCOSE: 164 mg/dL — AB (ref 65–99)
Potassium: 4 mmol/L (ref 3.5–5.1)
SODIUM: 134 mmol/L — AB (ref 135–145)
TOTAL PROTEIN: 8.3 g/dL — AB (ref 6.5–8.1)

## 2016-09-24 LAB — HEPARIN LEVEL (UNFRACTIONATED): HEPARIN UNFRACTIONATED: 0.91 [IU]/mL — AB (ref 0.30–0.70)

## 2016-09-24 MED ORDER — ONDANSETRON 4 MG PO TBDP
4.0000 mg | ORAL_TABLET | ORAL | Status: DC | PRN
  Administered 2016-09-24: 4 mg via ORAL
  Filled 2016-09-24 (×2): qty 1

## 2016-09-24 MED ORDER — ONDANSETRON HCL 4 MG/2ML IJ SOLN
4.0000 mg | Freq: Four times a day (QID) | INTRAMUSCULAR | Status: DC | PRN
  Administered 2016-09-24 – 2016-09-25 (×2): 4 mg via INTRAVENOUS
  Filled 2016-09-24 (×2): qty 2

## 2016-09-24 MED ORDER — HYDROMORPHONE HCL 2 MG/ML IJ SOLN
1.0000 mg | INTRAMUSCULAR | Status: DC | PRN
  Administered 2016-09-24 – 2016-09-25 (×4): 1 mg via INTRAVENOUS
  Filled 2016-09-24 (×4): qty 1

## 2016-09-24 MED ORDER — ALUM & MAG HYDROXIDE-SIMETH 200-200-20 MG/5ML PO SUSP
30.0000 mL | ORAL | Status: DC | PRN
  Administered 2016-09-25: 30 mL via ORAL
  Filled 2016-09-24: qty 30

## 2016-09-24 MED ORDER — BISMUTH SUBSALICYLATE 262 MG/15ML PO SUSP
30.0000 mL | ORAL | Status: DC | PRN
  Administered 2016-09-24: 30 mL via ORAL
  Filled 2016-09-24: qty 118

## 2016-09-24 NOTE — Progress Notes (Signed)
ANTICOAGULATION CONSULT NOTE - Follow Up Consult  Pharmacy Consult:  Heparin / Coumadin Indication: MVR  Allergies  Allergen Reactions  . Cyclobenzaprine Hcl Nausea And Vomiting  . Latex Other (See Comments)    Unknown   Patient Measurements: Heparin Dosing Weight: 70kg  Vital Signs: Temp: 98.4 F (36.9 C) (11/01 0511) Temp Source: Oral (11/01 0511) BP: 148/81 (11/01 0511) Pulse Rate: 99 (11/01 0511)  Labs:  Recent Labs  09/22/16 0100 09/23/16 0601 09/24/16 0637  HGB 13.9 14.7 16.9*  HCT 41.3 44.7 49.1*  PLT 309 276 335  LABPROT 18.9* 24.1* 39.9*  INR 1.57 2.12 3.98  HEPARINUNFRC 0.42 0.64 0.91*    CrCl cannot be calculated (Unknown ideal weight.).   Assessment: [name] on Coumadin PTA for history of MVR.  Patient was transitioned to IV heparin for percutaneous drain placement and Coumadin was resumed on 09/22/16.  Heparin stopped 11/2 with INR 3.98.     INR 1.57 > 2.12>3.98 after 2 doses of coumadin. Ongoing nausea, unable to eat.  CBC stable, no bleeding reported.  Home Coumadin dose: 10mg  daily except 7.5mg  on Mon/Thurs   Goal of Therapy:  INR 2.5 - 3.5 Monitor platelets by anticoagulation protocol: Yes   Plan:  - heparin dc'd  - hold coumadin today - Daily INR  Herby AbrahamMichelle T. Milos Milligan, Pharm.D. [phone] 09/24/2016 10:38 AM

## 2016-09-24 NOTE — Progress Notes (Signed)
PT Cancellation Note  Patient Details Name: [name] MRN: [id number] DOB: Aug 17, 1961   Cancelled Treatment:    Reason Eval/Treat Not Completed: Medical issues which prohibited therapy (pt nauseous and actively vomiting. RN notified. Will follow. )   Tamala SerUhlenberg, Dajion Bickford Kistler 09/24/2016, 11:34 AM [phone]

## 2016-09-24 NOTE — Progress Notes (Signed)
Subjective: Complains of ongoing nausea, unable to eat, says the PO medicines do not work, IV is out.  Diarrhea is better, drainage from IR drain is clear and minimal.  Abdomen is soft.  She is in dark room lying in bed. Does not want any light in the room.  I don't see a note from PT so far.  Meds yesterday:  Xanax HS Coreg BID Questran - 2 doses yesterday, none today so far.. Vasotec -2 doses  Dilaudid 3 mg IV yesterday Zofran 1 PO dose, 3 IV doses Oxycodone 30 mg PO  Pepcid 20 PO/Protonix 40 mg BID Coumadin 7.5 x 1 Ambien 5 mg HS  Objective: Vital signs in last 24 hours: Temp:  [97.8 F (36.6 C)-98.7 F (37.1 C)] 98.4 F (36.9 C) (11/01 0511) Pulse Rate:  [82-99] 99 (11/01 0511) Resp:  [16] 16 (11/01 0511) BP: (148-164)/(62-88) 148/81 (11/01 0511) SpO2:  [95 %-99 %] 95 % (11/01 0511) Last BM Date: 09/23/16 480 PO Voided x 3 Drain 7.5 BM x 1 recorded  - 7 recorded 09/22/16 Afebrile, VSS WBC up to 15.8 H/H is up also INR up to 3.98  - Heparin drip is off  Intake/Output from previous day: 10/31 0701 - 11/01 0700 In: 490 [P.O.:480] Out: 7.5 [Drains:7.5] Intake/Output this shift: No intake/output data recorded.  General appearance: alert, cooperative, no distress and lights off, dark room says she has ongoing nausea and cannot eat. Resp: clear to auscultation bilaterally GI: soft, non-tender; bowel sounds normal; no masses,  no organomegaly and drain site and drainage is clear.  she did not seem tender at all on exam.  Lab Results:   Recent Labs  09/23/16 0601 09/24/16 0637  WBC 13.3* 15.8*  HGB 14.7 16.9*  HCT 44.7 49.1*  PLT 276 335    BMET No results for input(s): NA, K, CL, CO2, GLUCOSE, BUN, CREATININE, CALCIUM in the last 72 hours. PT/INR  Recent Labs  09/23/16 0601 09/24/16 0637  LABPROT 24.1* 39.9*  INR 2.12 3.98    No results for input(s): AST, ALT, ALKPHOS, BILITOT, PROT, ALBUMIN in the last 168 hours.   Lipase  No results found for:  LIPASE   Studies/Results: No results found.  Medications: . carvedilol  6.25 mg Oral BID WC  . cholestyramine  4 g Oral QID  . enalapril  5 mg Oral BID  . feeding supplement  1 Container Oral BID BM  . feeding supplement (ENSURE ENLIVE)  237 mL Oral BID BM  . pantoprazole  40 mg Oral BID  . Warfarin - Pharmacist Dosing Inpatient   Does not apply q1800   Prior to Admission medications   Medication Sig Start Date End Date Taking? Authorizing Provider  carvedilol (COREG) 6.25 MG tablet Take 1 tablet (6.25 mg total) by mouth 2 (two) times daily with a meal. 09/12/16  Yes Brooke Levora DredgeM Ghimire, MD  HYDROcodone-acetaminophen (NORCO/VICODIN) 5-325 MG tablet Take 1 tablet by mouth 3 (three) times daily as needed for moderate pain. 09/12/16  Yes Brooke Levora DredgeM Ghimire, MD  Melatonin 3 MG CAPS Take 3 mg by mouth at bedtime as needed (for sleep).   Yes Historical Provider, MD  promethazine (PHENERGAN) 25 MG tablet Take 25 mg by mouth 2 (two) times daily as needed for nausea or vomiting.  02/26/12  Yes Historical Provider, MD  warfarin (COUMADIN) 5 MG tablet Take 2.5 mg once on 10/21 Start taking your usual regimen on 10/22 (7.5 mg by mouth on Monday and Thursday. Take 10mg  by  mouth on all other days) Go to [name]'s office on 10/23 for INR check-and ask Brooke [name] for further dosing instructions 09/12/16  Yes Brooke Levora DredgeM Ghimire, MD  NITROSTAT 0.4 MG SL tablet DISSOLVE ONE TABLET UNDER TONGUE EVERY 5 MINUTES UP TO 3 DOSES AS NEEDED FOR CHEST PAIN Patient taking differently: DISSOLVE 0.4 MG UNDER TONGUE EVERY 5 MINUTES UP TO 3 DOSES AS NEEDED FOR CHEST PAIN 03/22/15   Brooke SidleSamuel G McDowell, MD      Assessment/Plan Abdominal pain, Nausea, probably secondary to some subhepatic fluid collection S/p laparoscopic cholecystectomy 09/08/16, Brooke. 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 ID:  she had 7 days of Zosyn 10/13-10/19/17  ciprofloxacin day 3  09/17/16, then discontinued. C diff negative 09/17/16 - No growth on Blood or fluid cultures VTE: heparin per pharmacy, drip discontinued INR  3.98 this AM - SCD's   Plan:  WBC is up, INR up to 3.98, diarrhea is better, ongoing complaints of nausea.  I will recheck urine and CXR.  Repeat CBC and BMP in AM, not sure why her H/H and WBC are both up. Will recheck in AM.  Discuss with [name].  Defer to coumadin to pharmacy.  She was on 7.5 Mon & Thursday.  10 mg [name], [name], [name], [name],[name] per pharmacy info.   LOS: 9 days    [name] 09/24/2016 [phone]

## 2016-09-25 ENCOUNTER — Inpatient Hospital Stay (HOSPITAL_COMMUNITY): Payer: Medicaid Other

## 2016-09-25 ENCOUNTER — Encounter (HOSPITAL_COMMUNITY): Payer: Self-pay | Admitting: Nurse Practitioner

## 2016-09-25 DIAGNOSIS — R188 Other ascites: Secondary | ICD-10-CM | POA: Diagnosis present

## 2016-09-25 DIAGNOSIS — T45515A Adverse effect of anticoagulants, initial encounter: Secondary | ICD-10-CM

## 2016-09-25 DIAGNOSIS — D6832 Hemorrhagic disorder due to extrinsic circulating anticoagulants: Secondary | ICD-10-CM | POA: Diagnosis present

## 2016-09-25 DIAGNOSIS — N179 Acute kidney failure, unspecified: Secondary | ICD-10-CM | POA: Diagnosis present

## 2016-09-25 DIAGNOSIS — D649 Anemia, unspecified: Secondary | ICD-10-CM | POA: Diagnosis present

## 2016-09-25 DIAGNOSIS — B999 Unspecified infectious disease: Secondary | ICD-10-CM

## 2016-09-25 LAB — GLUCOSE, CAPILLARY
Glucose-Capillary: 128 mg/dL — ABNORMAL HIGH (ref 65–99)
Glucose-Capillary: 136 mg/dL — ABNORMAL HIGH (ref 65–99)

## 2016-09-25 LAB — CBC
HEMATOCRIT: 52.4 % — AB (ref 36.0–46.0)
Hemoglobin: 18.4 g/dL — ABNORMAL HIGH (ref 12.0–15.0)
MCH: 33.2 pg (ref 26.0–34.0)
MCHC: 35.1 g/dL (ref 30.0–36.0)
MCV: 94.4 fL (ref 78.0–100.0)
Platelets: 304 10*3/uL (ref 150–400)
RBC: 5.55 MIL/uL — ABNORMAL HIGH (ref 3.87–5.11)
RDW: 13.7 % (ref 11.5–15.5)
WBC: 25.3 10*3/uL — AB (ref 4.0–10.5)

## 2016-09-25 LAB — COMPREHENSIVE METABOLIC PANEL
ALBUMIN: 3.7 g/dL (ref 3.5–5.0)
ALT: 25 U/L (ref 14–54)
AST: 53 U/L — AB (ref 15–41)
Alkaline Phosphatase: 107 U/L (ref 38–126)
Anion gap: 21 — ABNORMAL HIGH (ref 5–15)
BILIRUBIN TOTAL: 1.2 mg/dL (ref 0.3–1.2)
BUN: 34 mg/dL — AB (ref 6–20)
CHLORIDE: 90 mmol/L — AB (ref 101–111)
CO2: 22 mmol/L (ref 22–32)
Calcium: 10.2 mg/dL (ref 8.9–10.3)
Creatinine, Ser: 1.81 mg/dL — ABNORMAL HIGH (ref 0.44–1.00)
GFR calc Af Amer: 35 mL/min — ABNORMAL LOW (ref >60)
GFR calc non Af Amer: 31 mL/min — ABNORMAL LOW (ref >60)
GLUCOSE: 254 mg/dL — AB (ref 65–99)
POTASSIUM: 3.7 mmol/L (ref 3.5–5.1)
Sodium: 133 mmol/L — ABNORMAL LOW (ref 135–145)
TOTAL PROTEIN: 7.9 g/dL (ref 6.5–8.1)

## 2016-09-25 LAB — PROTIME-INR
INR: 6.29 — AB
PROTHROMBIN TIME: 57.5 s — AB (ref 11.4–15.2)

## 2016-09-25 LAB — C-REACTIVE PROTEIN: CRP: 9 mg/dL — ABNORMAL HIGH (ref <1.0)

## 2016-09-25 LAB — SEDIMENTATION RATE: Sed Rate: 39 mm/hr — ABNORMAL HIGH (ref 0–22)

## 2016-09-25 MED ORDER — INSULIN ASPART 100 UNIT/ML ~~LOC~~ SOLN
0.0000 [IU] | SUBCUTANEOUS | Status: DC
  Administered 2016-09-25 (×2): 1 [IU] via SUBCUTANEOUS
  Administered 2016-09-26 (×2): 2 [IU] via SUBCUTANEOUS
  Administered 2016-09-26 – 2016-09-27 (×2): 1 [IU] via SUBCUTANEOUS
  Administered 2016-09-27 (×2): 2 [IU] via SUBCUTANEOUS
  Administered 2016-09-28 (×4): 1 [IU] via SUBCUTANEOUS
  Administered 2016-09-29: 2 [IU] via SUBCUTANEOUS
  Administered 2016-09-29 – 2016-10-01 (×5): 1 [IU] via SUBCUTANEOUS
  Administered 2016-10-01 (×2): 2 [IU] via SUBCUTANEOUS

## 2016-09-25 MED ORDER — MENTHOL 3 MG MT LOZG: 1.0000 | LOZENGE | OROMUCOSAL | Status: DC | PRN

## 2016-09-25 MED ORDER — KETOROLAC TROMETHAMINE 15 MG/ML IJ SOLN
15.0000 mg | Freq: Once | INTRAMUSCULAR | Status: AC
  Administered 2016-09-25: 15 mg via INTRAVENOUS
  Filled 2016-09-25: qty 1

## 2016-09-25 MED ORDER — CARVEDILOL 12.5 MG PO TABS
12.5000 mg | ORAL_TABLET | Freq: Two times a day (BID) | ORAL | Status: DC
  Administered 2016-09-25 – 2016-09-26 (×3): 12.5 mg via ORAL
  Filled 2016-09-25 (×4): qty 1

## 2016-09-25 MED ORDER — BLISTEX MEDICATED EX OINT
TOPICAL_OINTMENT | Freq: Two times a day (BID) | CUTANEOUS | Status: DC
  Administered 2016-09-25 – 2016-09-29 (×9): via TOPICAL
  Administered 2016-09-30: 1 via TOPICAL
  Administered 2016-10-01 – 2016-10-02 (×3): via TOPICAL
  Administered 2016-10-03: 1 via TOPICAL
  Administered 2016-10-03 – 2016-10-11 (×11): via TOPICAL
  Administered 2016-10-12: 1 via TOPICAL
  Administered 2016-10-12 – 2016-10-13 (×2): via TOPICAL
  Administered 2016-10-14: 1 via TOPICAL
  Administered 2016-10-14 – 2016-10-16 (×4): via TOPICAL
  Filled 2016-09-25 (×2): qty 6.3

## 2016-09-25 MED ORDER — MAGIC MOUTHWASH: 15.0000 mL | Freq: Four times a day (QID) | ORAL | Status: DC | PRN

## 2016-09-25 MED ORDER — SODIUM CHLORIDE 0.9 % IV BOLUS (SEPSIS)
500.0000 mL | Freq: Once | INTRAVENOUS | Status: AC
  Administered 2016-09-25: 500 mL via INTRAVENOUS

## 2016-09-25 MED ORDER — HYDRALAZINE HCL 20 MG/ML IJ SOLN: 5.0000 mg | Freq: Four times a day (QID) | INTRAMUSCULAR | Status: DC | PRN

## 2016-09-25 MED ORDER — SODIUM CHLORIDE 0.9 % IV SOLN
INTRAVENOUS | Status: AC
  Administered 2016-09-25 (×2): via INTRAVENOUS

## 2016-09-25 MED ORDER — LOPERAMIDE HCL 2 MG PO CAPS: 2.0000 mg | ORAL_CAPSULE | Freq: Three times a day (TID) | ORAL | Status: DC | PRN

## 2016-09-25 MED ORDER — PHENOL 1.4 % MT LIQD: 2.0000 | OROMUCOSAL | Status: DC | PRN

## 2016-09-25 MED ORDER — FAMOTIDINE 20 MG PO TABS: 20.0000 mg | ORAL_TABLET | Freq: Two times a day (BID) | ORAL | Status: DC

## 2016-09-25 MED ORDER — SODIUM CHLORIDE 0.9 % IV SOLN: 250.0000 mL | INTRAVENOUS | Status: DC | PRN

## 2016-09-25 MED ORDER — PROMETHAZINE HCL 25 MG/ML IJ SOLN
25.0000 mg | Freq: Four times a day (QID) | INTRAMUSCULAR | Status: DC | PRN
  Administered 2016-09-25 – 2016-10-01 (×16): 25 mg via INTRAVENOUS
  Filled 2016-09-25 (×17): qty 1

## 2016-09-25 MED ORDER — FENTANYL 25 MCG/HR TD PT72
25.0000 ug | MEDICATED_PATCH | TRANSDERMAL | Status: DC
  Administered 2016-09-25 – 2016-10-01 (×3): 25 ug via TRANSDERMAL
  Filled 2016-09-25 (×3): qty 1

## 2016-09-25 MED ORDER — METOPROLOL TARTRATE 5 MG/5ML IV SOLN: 5.0000 mg | Freq: Four times a day (QID) | INTRAVENOUS | Status: DC | PRN

## 2016-09-25 MED ORDER — LIP MEDEX EX OINT
1.0000 "application " | TOPICAL_OINTMENT | Freq: Two times a day (BID) | CUTANEOUS | Status: DC
  Filled 2016-09-25: qty 7

## 2016-09-25 MED ORDER — FAMOTIDINE IN NACL 20-0.9 MG/50ML-% IV SOLN
20.0000 mg | Freq: Two times a day (BID) | INTRAVENOUS | Status: DC
  Administered 2016-09-25 – 2016-09-27 (×5): 20 mg via INTRAVENOUS
  Filled 2016-09-25 (×9): qty 50

## 2016-09-25 MED ORDER — SODIUM CHLORIDE 0.9 % IV SOLN: INTRAVENOUS | Status: DC

## 2016-09-25 MED ORDER — METHOCARBAMOL 1000 MG/10ML IJ SOLN
1000.0000 mg | Freq: Four times a day (QID) | INTRAVENOUS | Status: DC | PRN
  Filled 2016-09-25: qty 10

## 2016-09-25 MED ORDER — IOPAMIDOL (ISOVUE-300) INJECTION 61%
INTRAVENOUS | Status: AC
  Administered 2016-09-25: 18:00:00
  Filled 2016-09-25: qty 30

## 2016-09-25 MED ORDER — METHOCARBAMOL 500 MG PO TABS: 1000.0000 mg | ORAL_TABLET | Freq: Four times a day (QID) | ORAL | Status: DC | PRN

## 2016-09-25 MED ORDER — ACETAMINOPHEN 500 MG PO TABS
1000.0000 mg | ORAL_TABLET | Freq: Three times a day (TID) | ORAL | Status: DC
  Administered 2016-09-25 – 2016-10-06 (×13): 1000 mg via ORAL
  Filled 2016-09-25 (×35): qty 2

## 2016-09-25 MED ORDER — METOPROLOL TARTRATE 5 MG/5ML IV SOLN
5.0000 mg | Freq: Four times a day (QID) | INTRAVENOUS | Status: DC | PRN
  Administered 2016-09-26: 5 mg via INTRAVENOUS
  Filled 2016-09-25: qty 5

## 2016-09-25 MED ORDER — LACTATED RINGERS IV BOLUS (SEPSIS): 1000.0000 mL | Freq: Three times a day (TID) | INTRAVENOUS | Status: DC | PRN

## 2016-09-25 MED ORDER — FENTANYL CITRATE (PF) 100 MCG/2ML IJ SOLN: 12.5000 ug | INTRAMUSCULAR | Status: DC | PRN

## 2016-09-25 MED ORDER — PSYLLIUM 95 % PO PACK
1.0000 | PACK | Freq: Every day | ORAL | Status: DC
  Filled 2016-09-25: qty 1

## 2016-09-25 MED ORDER — FENTANYL CITRATE (PF) 100 MCG/2ML IJ SOLN
25.0000 ug | INTRAMUSCULAR | Status: DC | PRN
  Administered 2016-09-25 – 2016-09-30 (×34): 25 ug via INTRAVENOUS
  Filled 2016-09-25 (×36): qty 2

## 2016-09-25 MED ORDER — SACCHAROMYCES BOULARDII 250 MG PO CAPS
250.0000 mg | ORAL_CAPSULE | Freq: Two times a day (BID) | ORAL | Status: DC
  Administered 2016-09-25 – 2016-10-16 (×38): 250 mg via ORAL
  Filled 2016-09-25 (×40): qty 1

## 2016-09-25 MED ORDER — SODIUM CHLORIDE 0.9% FLUSH: 3.0000 mL | INTRAVENOUS | Status: DC | PRN

## 2016-09-25 MED ORDER — ONDANSETRON HCL 4 MG/2ML IJ SOLN
4.0000 mg | Freq: Four times a day (QID) | INTRAMUSCULAR | Status: DC
  Administered 2016-09-25 – 2016-10-01 (×24): 4 mg via INTRAVENOUS
  Filled 2016-09-25 (×25): qty 2

## 2016-09-25 MED ORDER — CHOLESTYRAMINE 4 G PO PACK: 4.0000 g | PACK | Freq: Two times a day (BID) | ORAL | Status: DC

## 2016-09-25 MED ORDER — SODIUM CHLORIDE 0.9% FLUSH
3.0000 mL | Freq: Two times a day (BID) | INTRAVENOUS | Status: DC
  Administered 2016-09-25 – 2016-10-15 (×26): 3 mL via INTRAVENOUS

## 2016-09-25 MED ORDER — HYDRALAZINE HCL 20 MG/ML IJ SOLN: 10.0000 mg | Freq: Four times a day (QID) | INTRAMUSCULAR | Status: DC | PRN

## 2016-09-25 NOTE — Progress Notes (Signed)
Contrast dye given to patient to drink at 1800

## 2016-09-25 NOTE — Progress Notes (Signed)
ANTICOAGULATION CONSULT NOTE - Follow Up Consult  Pharmacy Consult:  Heparin / Coumadin Indication: MVR  Allergies  Allergen Reactions  . Cyclobenzaprine Hcl Nausea And Vomiting  . Latex Other (See Comments)    Unknown   Patient Measurements: Heparin Dosing Weight: 70kg  Vital Signs: Temp: 98.1 F (36.7 C) (11/02 0500) Temp Source: Oral (11/02 0500) BP: 160/96 (11/02 0500) Pulse Rate: 119 (11/02 0500)  Labs:  Recent Labs  09/23/16 0601 09/24/16 0637 09/24/16 1201 09/25/16 0853  HGB 14.7 16.9*  --  18.4*  HCT 44.7 49.1*  --  52.4*  PLT 276 335  --  304  LABPROT 24.1* 39.9*  --  57.5*  INR 2.12 3.98  --  6.29*  HEPARINUNFRC 0.64 0.91*  --   --   CREATININE  --   --  1.17*  --     CrCl cannot be calculated (Unknown ideal weight.).   Assessment: 55 YOF on Coumadin PTA for history of MVR.  Patient was transitioned to IV heparin for percutaneous drain placement and Coumadin was resumed on 09/22/16.  Heparin stopped 11/2 with INR 3.98.     INR 1.57 > 2.12>3.98>6.29 after 2 doses of coumadin. Ongoing nausea/vomiting, unable to eat.  H/H rising, no overt bleeding reported.  Home Coumadin dose: 10mg  daily except 7.5mg  on Mon/Thurs   Goal of Therapy:  INR 2.5 - 3.5 Monitor platelets by anticoagulation protocol: Yes   Plan: - hold coumadin again today - Daily INR  Herby AbrahamMichelle T. Liller Yohn, Pharm.D. [phone] 09/25/2016 10:51 AM

## 2016-09-25 NOTE — Progress Notes (Signed)
Referring Physician(s): Dr. Claud KelpHaywood Ingram  Supervising Physician: Dr. Katherina RightJay Watts  [facility]  [facility]  Chief Complaint: F/U GB fossa drain  Subjective: Pt sitting up in bed, c/o epigastric discomfort, no appetite.  Allergies: Cyclobenzaprine hcl and Latex  Medications:  Current Facility-Administered Medications:  .  0.9 %  sodium chloride infusion, 250 mL, Intravenous, PRN, Karie SodaSteven Gross, MD .  acetaminophen (TYLENOL) tablet 1,000 mg, 1,000 mg, Oral, TID, Karie SodaSteven Gross, MD, 1,000 mg at 09/25/16 0903 .  ALPRAZolam Prudy Feeler(XANAX) tablet 0.5 mg, 0.5 mg, Oral, QHS PRN, Harriette Bouillonhomas Cornett, MD, 0.5 mg at 09/24/16 2108 .  alum & mag hydroxide-simeth (MAALOX/MYLANTA) 200-200-20 MG/5ML suspension 30 mL, 30 mL, Oral, Q4H PRN, Manus RuddMatthew Tsuei, MD, 30 mL at 09/25/16 0903 .  carvedilol (COREG) tablet 12.5 mg, 12.5 mg, Oral, BID WC, Karie SodaSteven Gross, MD .  cholestyramine Lanetta Inch(QUESTRAN) packet 4 g, 4 g, Oral, BID, Karie SodaSteven Gross, MD .  enalapril (VASOTEC) tablet 5 mg, 5 mg, Oral, BID, Harriette Bouillonhomas Cornett, MD, 5 mg at 09/25/16 0903 .  famotidine (PEPCID) tablet 20 mg, 20 mg, Oral, BID, Russella DarAllison L Ellis, NP .  feeding supplement (ENSURE ENLIVE) (ENSURE ENLIVE) liquid 237 mL, 237 mL, Oral, BID BM, Berna Buehelsea A Connor, MD, 237 mL at 09/25/16 1000 .  fentaNYL (SUBLIMAZE) injection 12.5 mcg, 12.5 mcg, Intravenous, Q2H PRN, Russella DarAllison L Ellis, NP .  hydrALAZINE (APRESOLINE) injection 10 mg, 10 mg, Intravenous, Q6H PRN, Russella DarAllison L Ellis, NP .  hydrALAZINE (APRESOLINE) injection 5-20 mg, 5-20 mg, Intravenous, Q6H PRN, Karie SodaSteven Gross, MD .  lactated ringers bolus 1,000 mL, 1,000 mL, Intravenous, Q8H PRN, Karie SodaSteven Gross, MD .  lip balm (BLISTEX) ointment, , Topical, BID, Karie SodaSteven Gross, MD .  loperamide (IMODIUM) capsule 2-4 mg, 2-4 mg, Oral, Q8H PRN, Karie SodaSteven Gross, MD .  magic mouthwash, 15 mL, Oral, QID PRN, Karie SodaSteven Gross, MD .  menthol-cetylpyridinium (CEPACOL) lozenge 3 mg, 1 lozenge, Oral, PRN, Karie SodaSteven Gross, MD .  methocarbamol  (ROBAXIN) 1,000 mg in dextrose 5 % 50 mL IVPB, 1,000 mg, Intravenous, Q6H PRN, Karie SodaSteven Gross, MD .  methocarbamol (ROBAXIN) tablet 1,000 mg, 1,000 mg, Oral, Q6H PRN, Karie SodaSteven Gross, MD .  metoprolol (LOPRESSOR) injection 5 mg, 5 mg, Intravenous, Q6H PRN, Karie SodaSteven Gross, MD .  nitroGLYCERIN (NITROSTAT) SL tablet 0.4 mg, 0.4 mg, Sublingual, Q5 min PRN, Harriette Bouillonhomas Cornett, MD .  ondansetron [facility](ZOFRAN) injection 4 mg, 4 mg, Intravenous, Q6H, Russella DarAllison L Ellis, NP .  oxyCODONE (Oxy IR/ROXICODONE) immediate release tablet 5-10 mg, 5-10 mg, Oral, Q4H PRN, Sherrie GeorgeWillard Jennings, PA-C, 10 mg at 09/25/16 [id number] .  phenol (CHLORASEPTIC) mouth spray 2 spray, 2 spray, Mouth/Throat, PRN, Karie SodaSteven Gross, MD .  promethazine (PHENERGAN) injection 25 mg, 25 mg, Intravenous, Q6H PRN, Russella DarAllison L Ellis, NP .  psyllium (HYDROCIL/METAMUCIL) packet 1 packet, 1 packet, Oral, Daily, Karie SodaSteven Gross, MD .  saccharomyces boulardii (FLORASTOR) capsule 250 mg, 250 mg, Oral, BID, Karie SodaSteven Gross, MD, 250 mg at 09/25/16 0903 .  sodium chloride flush (NS) 0.9 % injection 3 mL, 3 mL, Intravenous, Q12H, Karie SodaSteven Gross, MD, 3 mL at 09/25/16 1000 .  sodium chloride flush (NS) 0.9 % injection 3 mL, 3 mL, Intravenous, PRN, Karie SodaSteven Gross, MD .  Warfarin - Pharmacist Dosing Inpatient, , Does not apply, q1800, Gerilyn Nestlehuy D Dang, RPH, Stopped at 09/24/16 1800 .  zolpidem (AMBIEN) tablet 5 mg, 5 mg, Oral, QHS PRN, Harriette Bouillonhomas Cornett, MD, 5 mg at 09/23/16 2233    Vital Signs: BP (!) 160/96 (BP Location: Left Arm)  Pulse (!) 119   Temp 98.1 F (36.7 C) (Oral)   Resp 19   SpO2 96%   Physical Exam Awake and alert Abdomen soft Drain in place, site clean, NT Scant serous output  Imaging: Dg Chest 2 View  Result Date: 09/24/2016 CLINICAL DATA:  Elevated white count. Epigastric pain and vomiting. Recent gallbladder surgery. EXAM: CHEST  2 VIEW COMPARISON:  09/02/2016. FINDINGS: Normal heart size with clear lung fields. Prior median sternotomy for valve replacement. No  active infiltrates or failure. Pigtail catheter RIGHT upper quadrant adjacent to cholecystectomy clips. IMPRESSION: No active cardiopulmonary disease. Electronically Signed   By: Elsie Stain M.D.   On: 09/24/2016 11:19   Ct Image Guided Drainage By Percutaneous Catheter  Result Date: 09/21/2016 INDICATION: Gallbladder fossa abscess EXAM: CT GUIDED DRAINAGE OF GALLBLADDER FOSSA ABSCESS MEDICATIONS: The patient is currently admitted to the hospital and receiving intravenous antibiotics. The antibiotics were administered within an appropriate time frame prior to the initiation of the procedure. ANESTHESIA/SEDATION: Two mg IV Versed 100 mcg IV Fentanyl Moderate Sedation Time:  10 The patient was continuously monitored during the procedure by the interventional radiology nurse under my direct supervision. COMPLICATIONS: None immediate. TECHNIQUE: Informed written consent was obtained from the patient after a thorough discussion of the procedural risks, benefits and alternatives. All questions were addressed. Maximal Sterile Barrier Technique was utilized including caps, mask, sterile gowns, sterile gloves, sterile drape, hand hygiene and skin antiseptic. A timeout was performed prior to the initiation of the procedure. Latex free gloves were utilized. PROCEDURE: The right flank was prepped with ChloraPrep in a sterile fashion, and a sterile drape was applied covering the operative field. A sterile gown and sterile gloves were used for the procedure. Local anesthesia was provided with 1% Lidocaine. Under CT guidance, an 18 gauge needle was advanced into the gallbladder fossa via transhepatic inter costal approach. It was removed over an Amplatz. A 10 French dilator followed by a 10 [place] drain were inserted. It was looped and string fixed then sewn to the skin. Serosanguineous fluid was aspirated. FINDINGS: Imaging confirms 54 French drain placement in the gallbladder fossa abscess. IMPRESSION: Successful 10  French gallbladder false abscess drain yielding serosanguineous fluid. Electronically Signed   By: Jolaine Click M.D.   On: 09/21/2016 11:58    Labs:  CBC:  Recent Labs  09/22/16 0100 09/23/16 0601 09/24/16 0637 09/25/16 0853  WBC 14.1* 13.3* 15.8* 25.3*  HGB 13.9 14.7 16.9* 18.4*  HCT 41.3 44.7 49.1* 52.4*  PLT 309 276 335 304    COAGS:  Recent Labs  09/04/16 0515  09/15/16 2224  09/21/16 0153 09/22/16 0100 09/23/16 0601 09/24/16 0637  INR 1.83  < > 3.08  < > 1.61 1.57 2.12 3.98  APTT 92*  --  51*  --   --   --   --   --   < > = values in this interval not displayed.  BMP:  Recent Labs  09/16/16 1135 09/18/16 0552 09/21/16 0153 09/24/16 1201  NA 137 137 135 134*  K 3.7 4.6 4.0 4.0  CL 104 105 101 98*  CO2 25 25 24  21*  GLUCOSE 138* 114* 121* 164*  BUN 7 <5* <5* 12  CALCIUM 8.7* 8.5* 8.8* 10.1  CREATININE 0.88 0.91 0.76 1.17*  GFRNONAA >60 >60 >60 52*  GFRAA >60 >60 >60 >60    LIVER FUNCTION TESTS:  Recent Labs  09/06/16 0739 09/15/16 2224 09/16/16 1135 09/24/16 1201  BILITOT 1.0 0.6 0.6  0.9  AST 19 22 30  38  ALT 12* 20 17 27   ALKPHOS 57 119 108 96  PROT 5.7* 7.1 6.5 8.3*  ALBUMIN 2.7* 3.1* 3.0* 3.9    Assessment and Plan:  S/P GB fossa drain  09/21/2016 by Dr. Bonnielee HaffHoss. Continue routine drain care. Plan per [facility]  Electronically Signed: Brayton ElBRUNING, Kylar Leonhardt PA-C 09/25/2016, 9:57 AM   I spent a total of 15 Minutes at the the patient's bedside AND on the patient's hospital floor or unit, greater than 50% of which was counseling/coordinating care for f/u GB fossa drain

## 2016-09-25 NOTE — Progress Notes (Addendum)
BP up and she previously expressed concerns over inadequate pain control so will increase Fentanyl IV to 25 mcg q 2 hrs. Has prn IV Hydralazine available- next dose Coreg due at 1500; was given Vasotec at 900 and next dose due at 10p- of note appears Vasotec was started this admission by the primary team. ?? Narcotic withdrawal. XR shows ileus which is mild. If has additional emesis recommend hold Coreg in favor of Lopressor 5 mg IV q 6 hours and hold Vasotec and continue with Hydralazine but give scheduled 10 mg IV q 6 hours. Could use Clonidine patch but this can sometimes worsen ileus sx's  1711: Discussed w/ attending- persistent tachycardia and plain XR w/ ileus- given leukocytosis witl go ahead and obtain non contrasted CT abd/pelvis-CRP and ESR are also elevated-also CBG > 250 so ck HgbA1c and follow CBGS/SSI  Erin Hearing ANP.

## 2016-09-25 NOTE — Progress Notes (Signed)
CRITICAL VALUE ALERT  Critical value received: PT 57.5 and INR 6.29  Date of notification:  09/25/16  Time of notification:  1000  Critical value read back: yes  Nurse who received alert:  Neysa Bonitohristy  MD notified (1st page):    Time of first page:  1014  MD notified (2nd page): Will Marlyne BeardsJennings PA  Time of second page:  Responding MD:    Time MD responded:

## 2016-09-25 NOTE — Progress Notes (Addendum)
CLARIFICATION: In Consult Note the physical exam statement that reads: "but nontender and had it" should read: "Non tympanitic" . Junious SilkAllison Kaili Castille, ANP

## 2016-09-25 NOTE — Progress Notes (Signed)
Referring Physician(s): Dr. Claud KelpHaywood Ingram  Supervising Physician: Gilmer MorWagner, Jaime  [facility]  Chief Complaint: F/U GB fossa drain   Subjective:  Pt doing OK. C/o some  Nausea.  Allergies: Cyclobenzaprine hcl and Latex  Medications: Prior to Admission medications   Medication Sig Start Date End Date Taking? Authorizing Provider  carvedilol (COREG) 6.25 MG tablet Take 1 tablet (6.25 mg total) by mouth 2 (two) times daily with a meal. 09/12/16  Yes Shanker Levora DredgeM Ghimire, MD  HYDROcodone-acetaminophen (NORCO/VICODIN) 5-325 MG tablet Take 1 tablet by mouth 3 (three) times daily as needed for moderate pain. 09/12/16  Yes Shanker Levora DredgeM Ghimire, MD  Melatonin 3 MG CAPS Take 3 mg by mouth at bedtime as needed (for sleep).   Yes Historical Provider, MD  promethazine (PHENERGAN) 25 MG tablet Take 25 mg by mouth 2 (two) times daily as needed for nausea or vomiting.  02/26/12  Yes Historical Provider, MD  warfarin (COUMADIN) 5 MG tablet Take 2.5 mg once on 10/21 Start taking your usual regimen on 10/22 (7.5 mg by mouth on Monday and Thursday. Take 10mg  by mouth on all other days) Go to Dr Bonnita LevanVyas's office on 10/23 for INR check-and ask Dr Sherril CroonVyas for further dosing instructions 09/12/16  Yes Shanker Levora DredgeM Ghimire, MD  NITROSTAT 0.4 MG SL tablet DISSOLVE ONE TABLET UNDER TONGUE EVERY 5 MINUTES UP TO 3 DOSES AS NEEDED FOR CHEST PAIN Patient taking differently: DISSOLVE 0.4 MG UNDER TONGUE EVERY 5 MINUTES UP TO 3 DOSES AS NEEDED FOR CHEST PAIN 03/22/15   Jonelle SidleSamuel G McDowell, MD     Vital Signs: BP (!) 160/96 (BP Location: Left Arm)   Pulse (!) 119   Temp 98.1 F (36.7 C) (Oral)   Resp 19   SpO2 96%   Physical Exam Awake and alert Abdomen soft Drain in place, looks ok Output ~ 7.5 ml , clear, scant blood. No purulence, non-bilious.  Imaging: Dg Chest 2 View  Result Date: 09/24/2016 CLINICAL DATA:  Elevated white count. Epigastric pain and vomiting. Recent gallbladder surgery. EXAM:  CHEST  2 VIEW COMPARISON:  09/02/2016. FINDINGS: Normal heart size with clear lung fields. Prior median sternotomy for valve replacement. No active infiltrates or failure. Pigtail catheter RIGHT upper quadrant adjacent to cholecystectomy clips. IMPRESSION: No active cardiopulmonary disease. Electronically Signed   By: Elsie StainJohn T Curnes M.D.   On: 09/24/2016 11:19   Ct Image Guided Drainage By Percutaneous Catheter  Result Date: 09/21/2016 INDICATION: Gallbladder fossa abscess EXAM: CT GUIDED DRAINAGE OF GALLBLADDER FOSSA ABSCESS MEDICATIONS: The patient is currently admitted to the hospital and receiving intravenous antibiotics. The antibiotics were administered within an appropriate time frame prior to the initiation of the procedure. ANESTHESIA/SEDATION: Two mg IV Versed 100 mcg IV Fentanyl Moderate Sedation Time:  10 The patient was continuously monitored during the procedure by the interventional radiology nurse under my direct supervision. COMPLICATIONS: None immediate. TECHNIQUE: Informed written consent was obtained from the patient after a thorough discussion of the procedural risks, benefits and alternatives. All questions were addressed. Maximal Sterile Barrier Technique was utilized including caps, mask, sterile gowns, sterile gloves, sterile drape, hand hygiene and skin antiseptic. A timeout was performed prior to the initiation of the procedure. Latex free gloves were utilized. PROCEDURE: The right flank was prepped with ChloraPrep in a sterile fashion, and a sterile drape was applied covering the operative field. A sterile gown and sterile gloves were used for the procedure. Local anesthesia was provided with 1% Lidocaine.  Under CT guidance, an 18 gauge needle was advanced into the gallbladder fossa via transhepatic inter costal approach. It was removed over an Amplatz. A 10 French dilator followed by a 10 [place] drain were inserted. It was looped and string fixed then sewn to the skin.  Serosanguineous fluid was aspirated. FINDINGS: Imaging confirms 8810 French drain placement in the gallbladder fossa abscess. IMPRESSION: Successful 10 French gallbladder false abscess drain yielding serosanguineous fluid. Electronically Signed   By: Jolaine ClickArthur  Hoss M.D.   On: 09/21/2016 11:58    Labs:  CBC:  Recent Labs  09/21/16 0153 09/22/16 0100 09/23/16 0601 09/24/16 0637  WBC 12.0* 14.1* 13.3* 15.8*  HGB 14.0 13.9 14.7 16.9*  HCT 41.4 41.3 44.7 49.1*  PLT 303 309 276 335    COAGS:  Recent Labs  09/04/16 0515  09/15/16 2224  09/21/16 0153 09/22/16 0100 09/23/16 0601 09/24/16 0637  INR 1.83  < > 3.08  < > 1.61 1.57 2.12 3.98  APTT 92*  --  51*  --   --   --   --   --   < > = values in this interval not displayed.  BMP:  Recent Labs  09/16/16 1135 09/18/16 0552 09/21/16 0153 09/24/16 1201  NA 137 137 135 134*  K 3.7 4.6 4.0 4.0  CL 104 105 101 98*  CO2 25 25 24  21*  GLUCOSE 138* 114* 121* 164*  BUN 7 <5* <5* 12  CALCIUM 8.7* 8.5* 8.8* 10.1  CREATININE 0.88 0.91 0.76 1.17*  GFRNONAA >60 >60 >60 52*  GFRAA >60 >60 >60 >60    LIVER FUNCTION TESTS:  Recent Labs  09/06/16 0739 09/15/16 2224 09/16/16 1135 09/24/16 1201  BILITOT 1.0 0.6 0.6 0.9  AST 19 22 30  38  ALT 12* 20 17 27   ALKPHOS 57 119 108 96  PROT 5.7* 7.1 6.5 8.3*  ALBUMIN 2.7* 3.1* 3.0* 3.9    Assessment and Plan:  S/P GB fossa drain  09/21/2016 by Dr. Bonnielee HaffHoss. Continue routine drain care. Plan per [facility]  Electronically Signed: Gwynneth MacleodWENDY S Cannie Muckle PA-C 09/25/2016, 9:00 AM   I spent a total of 15 Minutes at the the patient's bedside AND on the patient's hospital floor or unit, greater than 50% of which was counseling/coordinating care for f/u GB fossa drain

## 2016-09-25 NOTE — Progress Notes (Signed)
PT Cancellation Note  Patient Details Name: [name] MRN: [id number] DOB: 11/30/1960   Cancelled Treatment:    Reason Eval/Treat Not Completed: Medical issues which prohibited therapy. Lab in with pt and spoke with PA who was outside of pt's room.  Pt's INR is 6.29 at this time.  Did observe pt walking around room earlier in day with IV pole including ambulating backwards into the bathroom.  Will check back tomorrow.   Camryn Quesinberry LUBECK 09/25/2016, 11:53 AM

## 2016-09-25 NOTE — Progress Notes (Addendum)
[facility]  [address] [place], [place] [address] Phone: [phone] FAX: [place] [phone] 1961-09-06  CARE TEAM:  PCP: Glenda Chroman, MD  Outpatient Care Team: Patient Care Team: Glenda Chroman, MD as PCP - General (Internal Medicine)  Inpatient Treatment Team: Treatment Team: Attending Provider: Md Edison Pace, MD; Registered Nurse: Burman Foster, RN; Rounding Team: [facility] [place], MD; Technician: Steele Berg, NT; Registered Nurse: Rae Halsted, RN; Technician: Beverely Pace, NT; Physical Therapist: Galen Manila, PT; Registered Nurse: Tacey Heap, RN  Problem List:   Principal Problem:   Abscess of gallbladder fossa s/p perc drainage 09/21/2016 Active Problems:   HYPERLIPIDEMIA, MIXED   Coronary atherosclerosis   Chronic systolic heart failure ([place])   Leukocytosis   Acute cholecystitis s/p lap cholecystectomy 09/08/2016   Anxiety state   Abdominal pain       PROCEDURE:  LAPAROSCOPIC CHOLECYSTECTOMY WITH INTRAOPERATIVE CHOLANGIOGRAM 09/08/2016  PATIENT:  [name], 55 y.o., female, MRN: [id number]  POSTOP DIAGNOSIS:   acute cholecystitis, gall bladder folded on itself  SURGEON:   Alphonsa Overall, M.D.   The gall bladder was edematous and was about 1/2 encased in omental adhesions.  The duodenum was also stuck up to it.    Using fluoroscopy, the cholangiogram showed the flow of contrast into the common bile duct, up the hepatic radicals, and into the duodenum.  There was no mass or obstruction.  This was a normal intra-operative cholangiogram.   CT GUIDED DRAINAGE OF GALLBLADDER FOSSA ABSCESS On: 09/21/2016   INDICATION: Gallbladder fossa abscess  FINDINGS: Imaging confirms 10 French drain placement in the gallbladder fossa abscess.  IMPRESSION: Successful 10 French gallbladder false abscess drain yielding serosanguineous  fluid.  Electronically Signed   By: Marybelle Killings M.D.  Assessment   55 y.o. 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 Lucia Gaskins, discharged on 10/20.  Readmitted from ED on 10/23 for abnormal CT completed at [facility], indicating possible post-op complications of bile leak vs infection.  CT abd on 10/27 showed fluid/gas collection in GB fossa indicating possible infection. Percutaneous drain placed in subhepatic gallbladder fossa fluid collection 10/29.  Gradually improving  Plan:  Med consult  Switch to PO pain control  PPI for possible gastritis.  No mopre N/V/epigastric pain a hopeful sign.  Daughter worried about H pylori.  Will check serum for now.   Just had ##ABx so hold off any presumptive Tx.  ID: Completed 7 days of Zosyn 09/11/16 Ciprofloxacin 10/23-10/26 09/17/16, then discontinued.  No growth on Blood or fluid cultures.  Follow with drain.  If inc WBC, repeat CT - if negative, pull drain.  C diff negative 09/17/16.  Bowel regimen.  Daughter concerned for Ecoli - doubt & was on appropriate Abx for that  Diarrhea better - wean off cholestyramine & get on probiotics/fiber.  HTN, CHF.  Inc Coreg dose with HTN/inc HR.  PRN backup  FEN: solid diet.  Switch to [facility].  Supp shakes.  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  Off heparin.  Hold warfarin until INR lower.  F/u labs  -mobilize as tolerated to help recovery. Get her up.  I updated the  patient's status to the patient and nurse.  Recommendations were made.  Questions were answered.  They expressed understanding & appreciation.  Time = 30 min   Adin Hector, M.D., F.A.C.S. Gastrointestinal and Minimally  Invasive Surgery [facility] [address] [place], [place] [address] [phone] Main / Paging   09/25/2016  Subjective:  No more emesis Tired Poor appetite  Daughter left note - worried about Ecoli/Hpylori   Objective:  Vital signs:  Vitals:   09/24/16 1700 09/24/16 2039 09/24/16 2329 09/25/16 0500  BP: (!) 152/73 (!) 200/98 (!) 176/95 (!) 160/96  Pulse: (!) 104 (!) 108 (!) 110 (!) 119  Resp: '16 20  19  '$ Temp: 98.2 F (36.8 C) 97.6 F (36.4 C)  98.1 F (36.7 C)  TempSrc: Oral Oral  Oral  SpO2: 98% 98% 98% 96%    Last BM Date: 09/24/16  Intake/Output   Yesterday:  11/01 0701 - 11/02 0700 In: 465 [P.O.:460] Out: 7.5 [Drains:7.5] This shift:  No intake/output data recorded.  Bowel function:  Flatus: YES  BM:  YES  Drain: Thinly serous   Physical Exam:  General: Pt awake/alert/oriented x4 in No acute distress.  Tired but not toxic Eyes: PERRL, normal EOM.  Sclera clear.  No icterus Neuro: CN II-XII intact w/o focal sensory/motor deficits. Lymph: No head/neck/groin lymphadenopathy Psych:  No delerium/psychosis/paranoia HENT: Normocephalic, Mucus membranes moist.  No thrush Neck: Supple, No tracheal deviation Chest: No chest wall pain w good excursion CV:  Pulses intact.  Regular rhythm MS: Normal AROM mjr joints.  No obvious deformity Abdomen: Soft.  Nondistended.  Tenderness at RUQ drain only.  No epigastric TTP.  No evidence of peritonitis.  No incarcerated hernias. Ext:  SCDs BLE.  No mjr edema.  No cyanosis Skin: No petechiae / purpura  Results:    Diagnosis Gallbladder ACUTE CHOLECYSTITIS NO EVIDENCE OF MALIGNANCY Brooke Lanius MD Pathologist, Electronic Signature (Case signed 09/10/2016) Specimen [name] and Clinical Information Specimen(s) Obtained: Gallbladder Specimen Clinical Information Acute cholecystitis (nt) [name] Size/?Intact: 8 x 3.2 x 2.1 cm, received with a 6 x 2 cm full thickness  defect. Serosal surface: The serosa is smooth, tan pink and diffusely hyperemic with abundant adipose tissue. Mucosa/Wall: The mucosa is tan-pink, velvety and focally bile stained with punctate areas of golden yellow chalky deposition consistent with cholesterolosis. The wall thickness ranging from 0.2 to 0.3 cm. Contents: There is minimal green-brown viscous contents, and stones are not identified within the specimen or in the container. Cystic duct: Patent, cystic duct lymph node identified. Block Summary: 1A= representative sections. (AK:gt, 09/09/16)  Labs: Results for orders placed or performed during the hospital encounter of 09/15/16 (from the past 48 hour(s))  CBC     Status: Abnormal   Collection Time: 09/24/16  6:37 AM  Result Value Ref Range   WBC 15.8 (H) 4.0 - 10.5 K/uL   RBC 5.16 (H) 3.87 - 5.11 MIL/uL   Hemoglobin 16.9 (H) 12.0 - 15.0 g/dL   HCT 49.1 (H) 36.0 - 46.0 %   MCV 95.2 78.0 - 100.0 fL   MCH 32.8 26.0 - 34.0 pg   MCHC 34.4 30.0 - 36.0 g/dL   RDW 13.6 11.5 - 15.5 %   Platelets 335 150 - 400 K/uL  Heparin level (unfractionated)     Status: Abnormal   Collection Time: 09/24/16  6:37 AM  Result Value Ref Range   Heparin Unfractionated 0.91 (H) 0.30 - 0.70 IU/mL    Comment:  IF HEPARIN RESULTS ARE BELOW EXPECTED VALUES, AND PATIENT DOSAGE HAS BEEN CONFIRMED, SUGGEST FOLLOW UP TESTING OF ANTITHROMBIN III LEVELS.   Protime-INR     Status: Abnormal   Collection Time: 09/24/16  6:37 AM  Result Value Ref Range   Prothrombin Time 39.9 (H) 11.4 - 15.2 seconds   INR 3.98   Comprehensive metabolic panel     Status: Abnormal   Collection Time: 09/24/16 12:01 PM  Result Value Ref Range   Sodium 134 (L) 135 - 145 mmol/L   Potassium 4.0 3.5 - 5.1 mmol/L   Chloride 98 (L) 101 - 111 mmol/L   CO2 21 (L) 22 - 32 mmol/L   Glucose, Bld 164 (H) 65 - 99 mg/dL   BUN 12 6 - 20 mg/dL   Creatinine, Ser 1.17 (H) 0.44 - 1.00 mg/dL   Calcium 10.1 8.9 - 10.3 mg/dL    Total Protein 8.3 (H) 6.5 - 8.1 g/dL   Albumin 3.9 3.5 - 5.0 g/dL   AST 38 15 - 41 U/L   ALT 27 14 - 54 U/L   Alkaline Phosphatase 96 38 - 126 U/L   Total Bilirubin 0.9 0.3 - 1.2 mg/dL   GFR calc non Af Amer 52 (L) >60 mL/min   GFR calc Af Amer >60 >60 mL/min    Comment: (NOTE) The eGFR has been calculated using the CKD EPI equation. This calculation has not been validated in all clinical situations. eGFR's persistently <60 mL/min signify possible Chronic Kidney Disease.    Anion gap 15 5 - 15  Urinalysis, Routine w reflex microscopic (not at [facility])     Status: Abnormal   Collection Time: 09/24/16  9:45 PM  Result Value Ref Range   Color, Urine AMBER (A) YELLOW    Comment: BIOCHEMICALS MAY BE AFFECTED BY COLOR   APPearance CLOUDY (A) CLEAR   Specific Gravity, Urine 1.031 (H) 1.005 - 1.030   pH 5.5 5.0 - 8.0   Glucose, UA NEGATIVE NEGATIVE mg/dL   Hgb urine dipstick LARGE (A) NEGATIVE   Bilirubin Urine MODERATE (A) NEGATIVE   Ketones, ur 15 (A) NEGATIVE mg/dL   Protein, ur >300 (A) NEGATIVE mg/dL   Nitrite NEGATIVE NEGATIVE   Leukocytes, UA NEGATIVE NEGATIVE  Urine microscopic-add on     Status: Abnormal   Collection Time: 09/24/16  9:45 PM  Result Value Ref Range   Squamous Epithelial / LPF 0-5 (A) NONE SEEN   WBC, UA 0-5 0 - 5 WBC/hpf   RBC / HPF 6-30 0 - 5 RBC/hpf   Bacteria, UA RARE (A) NONE SEEN   Casts HYALINE CASTS (A) NEGATIVE   Urine-Other MUCOUS PRESENT     Imaging / Studies: Dg Chest 2 View  Result Date: 09/24/2016 CLINICAL DATA:  Elevated white count. Epigastric pain and vomiting. Recent gallbladder surgery. EXAM: CHEST  2 VIEW COMPARISON:  09/02/2016. FINDINGS: Normal heart size with clear lung fields. Prior median sternotomy for valve replacement. No active infiltrates or failure. Pigtail catheter RIGHT upper quadrant adjacent to cholecystectomy clips. IMPRESSION: No active cardiopulmonary disease. Electronically Signed   By: Staci Righter M.D.   On: 09/24/2016  11:19    Medications / Allergies: per chart  Antibiotics: Anti-infectives    Start     Dose/Rate Route Frequency Ordered Stop   09/15/16 2230  ciprofloxacin (CIPRO) IVPB 400 mg  Status:  Discontinued     400 mg 200 mL/hr over 60 Minutes Intravenous Every 12 hours 09/15/16 2215 09/17/16 1249  Note: Portions of this report may have been transcribed using voice recognition software. Every effort was made to ensure accuracy; however, inadvertent computerized transcription errors may be present.   Any transcriptional errors that result from this process are unintentional.     Adin Hector, M.D., F.A.C.S. Gastrointestinal and Minimally Invasive Surgery [facility] [address], [place], [place] [address] [phone] Main / Paging   09/25/2016

## 2016-09-25 NOTE — Progress Notes (Signed)
Subjective: Called vomiting and not relieved, BP and HR up we called Medicine earlier.  Labs show WBC up to 25.3 INR 6.29 AND H/H GOING UP.  Siting up in the bed crying and complaining of abdominal pain, mid epigastric, not really related to her drain or GB site.  She has had 25 mg  of phenergan IV,  At 10:  She is upset about medication changes.  BP 157/106, HR 125.    Pt reports she has been on Oxycodone 10 mg TID for 2 years and this was stopped and she was to go to Pain clinic in October, but she never got there.    Objective: Vital signs in last 24 hours: Temp:  [97.6 F (36.4 C)-98.2 F (36.8 C)] 98.1 F (36.7 C) (11/02 0500) Pulse Rate:  [104-125] 125 (11/02 1046) Resp:  [16-20] 19 (11/02 0500) BP: (152-200)/(73-106) 157/106 (11/02 1046) SpO2:  [96 %-100 %] 100 % (11/02 1046) Last BM Date: 09/24/16 460 PO Voided x 3 Vomited just a short time ago.   Drain 7.5 ml yesterday - serous fluid BMP ordered WBC up to 25.3 H/H is up and her INR is up to 6.29. Creatinine is up to 1.81 2 view abdomen: A few scattered air-fluid levels are noted in the right upper mid and lower abdomen on the upright view. There is no evidence of bowel obstruction. The stomach is not abnormally distended with air. No free extraluminal gas collections are observed. The right upper quadrant drainage catheter appears to be in reasonable position. The lung bases are clear.   Intake/Output from previous day: 11/01 0701 - 11/02 0700 In: 465 [P.O.:460] Out: 7.5 [Drains:7.5] Intake/Output this shift: Total I/O In: 360 [P.O.:360] Out: -   General appearance: alert, moderate distress and anxious rocking in bed complaining of pain.   Resp: clear to auscultation bilaterally GI: soft, drain is serous, pain is midepigastric area, She is soft and no peritonitis  Lab Results:   Recent Labs  09/24/16 0637 09/25/16 0853  WBC 15.8* 25.3*  HGB 16.9* 18.4*  HCT 49.1* 52.4*  PLT 335 304     BMET  Recent Labs  09/24/16 1201 09/25/16 1146  NA 134* 133*  K 4.0 3.7  CL 98* 90*  CO2 21* 22  GLUCOSE 164* 254*  BUN 12 34*  CREATININE 1.17* 1.81*  CALCIUM 10.1 10.2   PT/INR  Recent Labs  09/24/16 0637 09/25/16 0853  LABPROT 39.9* 57.5*  INR 3.98 6.29*     Recent Labs Lab 09/24/16 1201 09/25/16 1146  AST 38 53*  ALT 27 25  ALKPHOS 96 107  BILITOT 0.9 1.2  PROT 8.3* 7.9  ALBUMIN 3.9 3.7     Lipase  No results found for: LIPASE   Studies/Results: Dg Chest 2 View  Result Date: 09/24/2016 CLINICAL DATA:  Elevated white count. Epigastric pain and vomiting. Recent gallbladder surgery. EXAM: CHEST  2 VIEW COMPARISON:  09/02/2016. FINDINGS: Normal heart size with clear lung fields. Prior median sternotomy for valve replacement. No active infiltrates or failure. Pigtail catheter RIGHT upper quadrant adjacent to cholecystectomy clips. IMPRESSION: No active cardiopulmonary disease. Electronically Signed   By: Elsie Stain M.D.   On: 09/24/2016 11:19   Dg Abd 2 Views  Result Date: 09/25/2016 CLINICAL DATA:  Epigastric pain with nausea and vomiting. The patient has a catheter present on the right for intra-abdominal abscess drainage EXAM: ABDOMEN - 2 VIEW COMPARISON:  CT scan of September 21, 2016 FINDINGS: A few scattered air-fluid levels  are noted in the right upper mid and lower abdomen on the upright view. There is no evidence of bowel obstruction. The stomach is not abnormally distended with air. No free extraluminal gas collections are observed. The right upper quadrant drainage catheter appears to be in reasonable position. The lung bases are clear. IMPRESSION: Mild ileuswhich is predominantly right-sided. No free extraluminal gas collections. Electronically Signed   By: David  SwazilandJordan M.D.   On: 09/25/2016 13:25   Prior to Admission medications   Medication Sig Start Date End Date Taking? Authorizing Provider  carvedilol (COREG) 6.25 MG tablet Take 1 tablet  (6.25 mg total) by mouth 2 (two) times daily with a meal. 09/12/16  Yes Shanker Levora DredgeM Ghimire, MD  HYDROcodone-acetaminophen (NORCO/VICODIN) 5-325 MG tablet Take 1 tablet by mouth 3 (three) times daily as needed for moderate pain. 09/12/16  Yes Shanker Levora DredgeM Ghimire, MD  Melatonin 3 MG CAPS Take 3 mg by mouth at bedtime as needed (for sleep).   Yes Historical Provider, MD  promethazine (PHENERGAN) 25 MG tablet Take 25 mg by mouth 2 (two) times daily as needed for nausea or vomiting.  02/26/12  Yes Historical Provider, MD  warfarin (COUMADIN) 5 MG tablet Take 2.5 mg once on 10/21 Start taking your usual regimen on 10/22 (7.5 mg by mouth on Monday and Thursday. Take 10mg  by mouth on all other days) Go to Dr Bonnita LevanVyas's office on 10/23 for INR check-and ask Dr Sherril CroonVyas for further dosing instructions 09/12/16  Yes Shanker Levora DredgeM Ghimire, MD  NITROSTAT 0.4 MG SL tablet DISSOLVE ONE TABLET UNDER TONGUE EVERY 5 MINUTES UP TO 3 DOSES AS NEEDED FOR CHEST PAIN Patient taking differently: DISSOLVE 0.4 MG UNDER TONGUE EVERY 5 MINUTES UP TO 3 DOSES AS NEEDED FOR CHEST PAIN 03/22/15   Jonelle SidleSamuel G McDowell, MD     Medications: . acetaminophen  1,000 mg Oral TID  . carvedilol  12.5 mg Oral BID WC  . enalapril  5 mg Oral BID  . famotidine (PEPCID) IV  20 mg Intravenous Q12H  . fentaNYL  25 mcg Transdermal Q72H  . lip balm   Topical BID  . ondansetron (ZOFRAN) IV  4 mg Intravenous Q6H  . saccharomyces boulardii  250 mg Oral BID  . sodium chloride  500 mL Intravenous Once  . sodium chloride flush  3 mL Intravenous Q12H  . Warfarin - Pharmacist Dosing Inpatient   Does not apply q1800    Assessment/Plan Abdominal pain, Nausea, probably secondary to some subhepatic fluid collection S/p laparoscopic cholecystectomy 09/08/16, Dr. 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 ID: she had 7 days of Zosyn 10/13-10/19/17 ciprofloxacin day 3 09/17/16, then discontinued. C diff negative 09/17/16 - No growth on Blood or fluid cultures VTE: heparin per pharmacy, drip discontinued INR 3.98 yesterday and now up to 6.29/SCD's     Plan:   I have discussed with Medicine.  She is getting a fluid bolus for dehydration along with IV fluids.  2 view abdomen is ordered, blood cultures, they switched her to Fentanyl because of possible nausea just from the dilaudid. She seems is quiet when no one is in the room.  We have CMP  ordered, I am trying to get the results.  Will follow up on the films. Medicine is also getting cultures.  I have increased the fluids based on new creatinine finding.      LOS: 10 days    Fany Cavanaugh 09/25/2016 [phone]

## 2016-09-25 NOTE — Consult Note (Signed)
Consultation note   Brooke BoschMarlana W Niesen ZOX:[id number] DOB: 02-Sep-1961 DOA: 09/15/2016   PCP: Ignatius Speckinghruv B Vyas, MD   Patient coming from/Resides with: Private residence  Requesting M.D.: Gross/Gen. surgery  Reason for consultation: Assist with management of hypertension, nausea and vomiting in patient with multiple medical problems  HPI: [name] is a 55 y.o. 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 She was recently discharged on 10/20 for undergoing laparoscopic cholecystectomy for acute cholecystitis. During that hospitalization her chronic medical problems remain stable. She was readmitted back to the hospital on 10/23 for complaints of abdominal pain with associated nausea vomiting. She was found to have a fluid collection consistent with likely abscess in the gallbladder fossa and underwent CT-guided percutaneous drain placement on 10/29. She is on chronic warfarin and required discontinuation of her warfarin and supplementation with IV heparin before procedure could be performed. During previous hospitalization patient received Zosyn for 7 days. During that hospitalization she received Cipro for 3 days. There was no growth on any blood or fluid culture so antibiotics were discontinued. Unfortunately she has had issues with persistent diarrhea with nausea and vomiting and persistent central abdominal pain without reflux. Urinalysis appeared unremarkable but this was obtained while patient was on antibiotics. She has been unable to eat any solids although has been able to keep some clear liquids down. Her blood pressure has been reported as difficult to control. C. difficile PCR was negative. Because of reported uncontrolled  blood pressure and persistent nausea and vomiting,internal medical assistance has been requested.  **See below regarding plan of care. Thank you for allowing us to participate in the management of this patient's medical problems. We'll sign off for now and you can call us back for questions or if symptoms worsen.   Review of Systems:  In addition to the HPI above,  No Fever-chills, myalgias or other constitutional symptoms No Headache, changes with Vision or hearing, new weakness, tingling, numbness in any extremity, dizziness, dysarthria or word finding difficulty, gait disturbance or imbalance, tremors or seizure activity No problems swallowing food or Liquids, indigestion/reflux, choking or coughing while eating No Chest pain, Cough or Shortness of Breath, palpitations, orthopnea or DOE No melena,hematochezia, dark tarry stools No dysuria, malodorous urine, hematuria or flank pain although does report urinary dark No new skin rashes, lesions, masses or bruises, No new joint pains, aches, swelling or redness No recent unintentional weight gain or loss No polyuria, polydypsia or polyphagia   Past Medical History:  Diagnosis Date  . Anxiety   . Arthritis    "qwhere" (09/03/2016)  . Asthma   . CHF (congestive heart failure) (HCC)   . Chronic back pain   . COPD (chronic obstructive pulmonary disease) (HCC)   . Coronary artery disease   . Coronary disease    Minimal nonobstructive by cardiac catheterization prior to her surgery in 2007, ejection fraction of 55% at that time.  . Daily headache    "daily but really bad @ least 2/wk" (09/03/2016)  . Fibromyalgia   . History of CHF (congestive heart failure)    Ejection fraction 45% not candidate for ICD.  Marland Kitchen. History of high cholesterol   . Hypertension   . Migraine    "@ least 2/wk" (09/03/2016)  . Myocardial  infarction, anterior wall (HCC)     late reperfusion occluded LAD treated with a bare-metal stent complicated by  catheter-induced left mainstem dissection and also treated with a bare-metal stent., initial LV function 25-30% followup echocardiogram ejection fraction 45%   . Panic attacks   . Rheumatic heart disease    s/p St. Jude mitral valve in 2007  . Shortness of breath   . Spina bifida (HCC)   . Stroke [facility]) 09/24/2012   TIA; "issues w/my memory since" (09/03/2016)    Past Surgical History:  Procedure Laterality Date  . APPENDECTOMY    . CARDIAC CATHETERIZATION  06/2005; 11/2005   Hattie Perch 04/08/2011   . CARDIAC VALVE REPLACEMENT  02/2006   St. Jude MVR  . CHOLECYSTECTOMY N/A 09/08/2016   Procedure: LAPAROSCOPIC CHOLECYSTECTOMY WITH INTRAOPERATIVE CHOLANGIOGRAM;  Surgeon: Ovidio Kin, MD;  Location: [facility] OR;  Service: General;  Laterality: N/A;  . CORONARY ANGIOPLASTY WITH STENT PLACEMENT  06/2010   BMS to the mLAD (culprit vessel) and L main (which dissected during the procedure), non-obs dz CFX & RCA  . MEDIAN STERNOTOMY  02/2006   for mitral valve replacement (25 mm St. Jude mechanical prosthesis.)  . TUBAL LIGATION      Social History   Social History  . Marital status: Divorced    Spouse name: N/A  . Number of children: N/A  . Years of education: N/A   Occupational History  . Disabled    Social History Main Topics  . Smoking status: Former Smoker    Packs/day: 0.25    Years: 38.00    Types: Cigarettes    Quit date: 08/28/2016  . Smokeless tobacco: Never Used  . Alcohol use Yes     Comment: 09/03/2016 "drank some; quit after my 1st child was born in 82"  . Drug use: No  . Sexual activity: Not Currently    Birth control/ protection: Post-menopausal   Other Topics Concern  . Not on file   Social History Narrative   Lives in [place] with a boyfriend. She is currently disabled.     Mobility: Without assistive devices Work history: Not obtained   Allergies  Allergen Reactions  . Cyclobenzaprine Hcl Nausea And Vomiting  . Latex Other (See Comments)    Unknown     Family History  Problem Relation Age of Onset  . Heart attack Father 19  . Heart attack Mother 53  . Coronary artery disease Sister      Prior to Admission medications   Medication Sig Start Date End Date Taking? Authorizing Provider  carvedilol (COREG) 6.25 MG tablet Take 1 tablet (6.25 mg total) by mouth 2 (two) times daily with a meal. 09/12/16  Yes Shanker Levora Dredge, MD  HYDROcodone-acetaminophen (NORCO/VICODIN) 5-325 MG tablet Take 1 tablet by mouth 3 (three) times daily as needed for moderate pain. 09/12/16  Yes Shanker Levora Dredge, MD  Melatonin 3 MG CAPS Take 3 mg by mouth at bedtime as needed (for sleep).   Yes Historical Provider, MD  promethazine (PHENERGAN) 25 MG tablet Take 25 mg by mouth 2 (two) times daily as needed for nausea or vomiting.  02/26/12  Yes Historical Provider, MD  warfarin (COUMADIN) 5 MG tablet Take 2.5 mg once on 10/21 Start taking your usual regimen on 10/22 (7.5 mg by mouth on Monday and Thursday. Take 10mg  by mouth on all other days) Go to Dr Bonnita Levan office on 10/23 for INR check-and ask Dr Sherril Croon for further dosing instructions 09/12/16  Yes Shanker Judie Petit  Ghimire, MD  NITROSTAT 0.4 MG SL tablet DISSOLVE ONE TABLET UNDER TONGUE EVERY 5 MINUTES UP TO 3 DOSES AS NEEDED FOR CHEST PAIN Patient taking differently: DISSOLVE 0.4 MG UNDER TONGUE EVERY 5 MINUTES UP TO 3 DOSES AS NEEDED FOR CHEST PAIN 03/22/15   Jonelle Sidle, MD    Physical Exam: Vitals:   09/24/16 1700 09/24/16 2039 09/24/16 2329 09/25/16 0500  BP: (!) 152/73 (!) 200/98 (!) 176/95 (!) 160/96  Pulse: (!) 104 (!) 108 (!) 110 (!) 119  Resp: 16 20  19   Temp: 98.2 F (36.8 C) 97.6 F (36.4 C)  98.1 F (36.7 C)  TempSrc: Oral Oral  Oral  SpO2: 98% 98% 98% 96%      Constitutional: NAD, uncomfortable regarding persistent abdominal pain-appears pale Eyes: PERRL, lids and conjunctivae normal ENMT: Mucous membranes are dry. Posterior pharynx clear of any exudate or lesions.Normal dentition.   Neck: normal, supple, no masses, no thyromegaly Respiratory: clear to auscultation bilaterally, no wheezing, no crackles. Normal respiratory effort. No accessory muscle use.  Cardiovascular: Regular rate and rhythm, no murmurs / rubs / gallops. No extremity edema. 2+ pedal pulses. No carotid bruits.  Abdomen: Tender without guarding or rebounding primarily in epigastric and mid upper abdomen, abdomen is slightly distended but nontender and had it, no masses palpated. No hepatosplenomegaly. Bowel sounds positive.  Musculoskeletal: no clubbing / cyanosis. No joint deformity upper and lower extremities. Good ROM, no contractures. Normal muscle tone.  Skin: no rashes, lesions, ulcers. No induration Neurologic: CN 2-12 grossly intact. Sensation intact, DTR normal. Strength 5/5 x all 4 extremities.  Psychiatric: Normal judgment and insight. Alert and oriented x 3. Normal mood.    Labs on Admission: I have personally reviewed following labs and imaging studies  CBC:  Recent Labs Lab 09/21/16 0153 09/22/16 0100 09/23/16 0601 09/24/16 0637 09/25/16 0853  WBC 12.0* 14.1* 13.3* 15.8* 25.3*  HGB 14.0 13.9 14.7 16.9* 18.4*  HCT 41.4 41.3 44.7 49.1* 52.4*  MCV 96.5 96.3 99.1 95.2 94.4  PLT 303 309 276 335 304   Basic Metabolic Panel:  Recent Labs Lab 09/21/16 0153 09/24/16 1201  NA 135 134*  K 4.0 4.0  CL 101 98*  CO2 24 21*  GLUCOSE 121* 164*  BUN <5* 12  CREATININE 0.76 1.17*  CALCIUM 8.8* 10.1   GFR: CrCl cannot be calculated (Unknown ideal weight.). Liver Function Tests:  Recent Labs Lab 09/24/16 1201  AST 38  ALT 27  ALKPHOS 96  BILITOT 0.9  PROT 8.3*  ALBUMIN 3.9   No results for input(s): LIPASE, AMYLASE in the last 168 hours. No results for input(s): AMMONIA in the last 168 hours. Coagulation Profile:  Recent Labs Lab 09/21/16 0153 09/22/16 0100 09/23/16 0601 09/24/16 0637 09/25/16 0853  INR 1.61 1.57 2.12 3.98 6.29*   Cardiac Enzymes: No results for  input(s): CKTOTAL, CKMB, CKMBINDEX, TROPONINI in the last 168 hours. BNP (last 3 results) No results for input(s): PROBNP in the last 8760 hours. HbA1C: No results for input(s): HGBA1C in the last 72 hours. CBG: No results for input(s): GLUCAP in the last 168 hours. Lipid Profile: No results for input(s): CHOL, HDL, LDLCALC, TRIG, CHOLHDL, LDLDIRECT in the last 72 hours. Thyroid Function Tests: No results for input(s): TSH, T4TOTAL, FREET4, T3FREE, THYROIDAB in the last 72 hours. Anemia Panel: No results for input(s): VITAMINB12, FOLATE, FERRITIN, TIBC, IRON, RETICCTPCT in the last 72 hours. Urine analysis:    Component Value Date/Time   COLORURINE AMBER (A) 09/24/2016  2145   APPEARANCEUR CLOUDY (A) 09/24/2016 2145   LABSPEC 1.031 (H) 09/24/2016 2145   PHURINE 5.5 09/24/2016 2145   GLUCOSEU NEGATIVE 09/24/2016 2145   HGBUR LARGE (A) 09/24/2016 2145   BILIRUBINUR MODERATE (A) 09/24/2016 2145   KETONESUR 15 (A) 09/24/2016 2145   PROTEINUR >300 (A) 09/24/2016 2145   UROBILINOGEN 0.2 09/25/2012 0538   NITRITE NEGATIVE 09/24/2016 2145   LEUKOCYTESUR NEGATIVE 09/24/2016 2145   Sepsis Labs: @LABRCNTIP (procalcitonin:4,lacticidven:4) ) Recent Results (from the past 240 hour(s))  C difficile quick scan w PCR reflex     Status: None   Collection Time: 09/17/16 12:49 PM  Result Value Ref Range Status   C Diff antigen NEGATIVE NEGATIVE Final   C Diff toxin NEGATIVE NEGATIVE Final   C Diff interpretation No C. difficile detected.  Final  Aerobic Culture (superficial specimen)     Status: None   Collection Time: 09/21/16  1:16 PM  Result Value Ref Range Status   Specimen Description ABSCESS ABDOMEN  Final   Special Requests INTRO ABDOMINAL ABSCESS  Final   Gram Stain   Final    RARE WBC PRESENT, PREDOMINANTLY PMN NO ORGANISMS SEEN    Culture NO GROWTH 2 DAYS  Final   Report Status 09/23/2016 FINAL  Final     Radiological Exams on Admission: Dg Chest 2 View  Result Date:  09/24/2016 CLINICAL DATA:  Elevated white count. Epigastric pain and vomiting. Recent gallbladder surgery. EXAM: CHEST  2 VIEW COMPARISON:  09/02/2016. FINDINGS: Normal heart size with clear lung fields. Prior median sternotomy for valve replacement. No active infiltrates or failure. Pigtail catheter RIGHT upper quadrant adjacent to cholecystectomy clips. IMPRESSION: No active cardiopulmonary disease. Electronically Signed   By: Elsie StainJohn T Curnes M.D.   On: 09/24/2016 11:19     Assessment/Plan Principal Problem:   Abscess of gallbladder fossa s/p perc drainage 09/21/2016 post Acute cholecystitis s/p lap cholecystectomy 09/08/2016 -Per primary surgical team -Blood cultures previous admission no growth -Culture from fluid obtained from percutaneous drain currently without growth  Active Problems:   Leukocytosis -New increase in white count to 25,000 in setting of acute kidney injury and marked dehydration -Patient reports dark urine -For completeness of evaluation will obtain urine culture noting mildly abnormal urinalysis in setting of recent antibiotics which may be masking underlying UTI; will also repeat blood culture since have not been done since previous admission -Follow labs -No other sources of infection appreciated; pt without any upper respiratory symptoms    Nausea with vomiting/Abdominal pain/diarrhea -Check 2 view abdominal films to rule out ileus -Patient reporting having significant diarrhea initially after gallbladder removed but had decreased somewhat prior to returning to the hospital although she reports 5 loose/diarrheal stools in the past 24 hours-no blood in stools or emesis -C. difficile negative -Recently started on PPI which can exacerbate underlying diarrhea so I discontinued in favor of IV Pepcid-no reflux symptoms at this juncture -INR elevated at 6.29 and if abdominal films unremarkable consider repeating CT to rule out retroperitoneal hematoma -Begin symptom  management with scheduled Zofran and when necessary Phenergan -Clear liquids only -Discontinue oral pain medications and IV Dilaudid (both can cause issues with nausea) in favor of low-dose IV fentanyl along with a low-dose fentanyl patch -Discontinue Questran since can contribute to worsening nausea-what's can tolerate solid diet if loose stools persist can consider resuming for postcholecystectomy related dumping syndrome -1 time dose Toradol IV 15 mg -Discontinue stool softeners and laxatives -Consider gastroenterology consultation if GI symptoms persist and no  evidence of bowel obstruction or ileus    Acute kidney injury  -Mild and likely related to volume depletion from ongoing poor oral intake associated with nausea vomiting and loose stools. -Baseline renal function less than 5/0.76 with current renal function 12 and 1.17 -Follow chemistries; anticipate quick resolution of mild elevation in creatinine with correction of volume depletion -Patient appears dehydrated with hemoconcentration hemoglobin 18.4 with baseline hemoglobin 11.6 with associated mild hypercalcemia and very minimal hyponatremia    Chronic systolic heart failure  -Currently compensated -Was not on diuretics prior to admission -Normal saline bolus 500 mL 1 as above for dehydration -Give IV fluids for 24 hours only and reevaluate need -Continue carvedilol    Hypertension -Moderately controlled in setting of ongoing abdominal pain and persistent nausea -Patient think she will be able to keep her antihypertensive medications down i.e. not vomit them if she sticks to clear liquids -prn hydralazine    MITRAL VALVE REPLACEMENT, HX OF/Warfarin-induced coagulopathy  -Pharmacy managing warfarin -INR greater than 6 and likely related to recent antibiotics, mild acute kidney injury    CAD (coronary artery disease) -Asymptomatic      Anemia -Listed on previous discharge information Baseline hemoglobin 11.6 and current  hemoglobin markedly elevated at 18.4 consistent with hemoconcentration    HYPERLIPIDEMIA, MIXED  -Not all medications prior to admission    Chronic back pain -Was on oxycodone 5-10 mg oral at home -Not tolerating oral medications at this juncture -Utilizing IV fentanyl and fentanyl patch as above but recommend once can tolerate oral medications quickly transitioning back to home regimen    Anxiety state -Not on chronic medications at home      DVT prophylaxis: Warfarin  Code Status: Full  Family Communication: No family at bedside at time of admission Disposition Plan: Discretion of primary team     ELLIS,ALLISON L. ANP-BC [facility] Pager [phone]   If 7PM-7AM, please contact night-coverage [email or web address] Password [email or web address]  09/25/2016, 10:30 AM

## 2016-09-26 ENCOUNTER — Encounter: Payer: Self-pay | Admitting: Cardiology

## 2016-09-26 DIAGNOSIS — I10 Essential (primary) hypertension: Secondary | ICD-10-CM

## 2016-09-26 DIAGNOSIS — N179 Acute kidney failure, unspecified: Secondary | ICD-10-CM

## 2016-09-26 DIAGNOSIS — T45515A Adverse effect of anticoagulants, initial encounter: Secondary | ICD-10-CM

## 2016-09-26 DIAGNOSIS — R112 Nausea with vomiting, unspecified: Secondary | ICD-10-CM

## 2016-09-26 DIAGNOSIS — Z8673 Personal history of transient ischemic attack (TIA), and cerebral infarction without residual deficits: Secondary | ICD-10-CM

## 2016-09-26 DIAGNOSIS — I5022 Chronic systolic (congestive) heart failure: Secondary | ICD-10-CM

## 2016-09-26 DIAGNOSIS — Z952 Presence of prosthetic heart valve: Secondary | ICD-10-CM

## 2016-09-26 DIAGNOSIS — Z7901 Long term (current) use of anticoagulants: Secondary | ICD-10-CM

## 2016-09-26 DIAGNOSIS — R1013 Epigastric pain: Secondary | ICD-10-CM

## 2016-09-26 DIAGNOSIS — G8929 Other chronic pain: Secondary | ICD-10-CM

## 2016-09-26 DIAGNOSIS — E782 Mixed hyperlipidemia: Secondary | ICD-10-CM

## 2016-09-26 DIAGNOSIS — D689 Coagulation defect, unspecified: Secondary | ICD-10-CM

## 2016-09-26 DIAGNOSIS — Z9049 Acquired absence of other specified parts of digestive tract: Secondary | ICD-10-CM

## 2016-09-26 DIAGNOSIS — R197 Diarrhea, unspecified: Secondary | ICD-10-CM

## 2016-09-26 DIAGNOSIS — I2510 Atherosclerotic heart disease of native coronary artery without angina pectoris: Secondary | ICD-10-CM

## 2016-09-26 DIAGNOSIS — K810 Acute cholecystitis: Secondary | ICD-10-CM

## 2016-09-26 DIAGNOSIS — Z9689 Presence of other specified functional implants: Secondary | ICD-10-CM

## 2016-09-26 DIAGNOSIS — M549 Dorsalgia, unspecified: Secondary | ICD-10-CM

## 2016-09-26 LAB — COMPREHENSIVE METABOLIC PANEL
ALBUMIN: 2.6 g/dL — AB (ref 3.5–5.0)
ALK PHOS: 65 U/L (ref 38–126)
ALT: 21 U/L (ref 14–54)
ALT: 21 U/L (ref 14–54)
ANION GAP: 10 (ref 5–15)
AST: 36 U/L (ref 15–41)
AST: 34 U/L (ref 15–41)
Albumin: 2.5 g/dL — ABNORMAL LOW (ref 3.5–5.0)
Alkaline Phosphatase: 66 U/L (ref 38–126)
Anion gap: 11 (ref 5–15)
BILIRUBIN TOTAL: 1.4 mg/dL — AB (ref 0.3–1.2)
BUN: 15 mg/dL (ref 6–20)
BUN: 12 mg/dL (ref 6–20)
CALCIUM: 8.2 mg/dL — AB (ref 8.9–10.3)
CHLORIDE: 99 mmol/L — AB (ref 101–111)
CHLORIDE: 101 mmol/L (ref 101–111)
CO2: 24 mmol/L (ref 22–32)
CO2: 24 mmol/L (ref 22–32)
CREATININE: 0.72 mg/dL (ref 0.44–1.00)
Calcium: 8.3 mg/dL — ABNORMAL LOW (ref 8.9–10.3)
Creatinine, Ser: 0.71 mg/dL (ref 0.44–1.00)
GFR calc Af Amer: >60 mL/min (ref >60)
GFR calc Af Amer: >60 mL/min (ref >60)
GFR calc non Af Amer: >60 mL/min (ref >60)
GLUCOSE: 152 mg/dL — AB (ref 65–99)
Glucose, Bld: 160 mg/dL — ABNORMAL HIGH (ref 65–99)
POTASSIUM: 3.3 mmol/L — AB (ref 3.5–5.1)
Potassium: 3.5 mmol/L (ref 3.5–5.1)
SODIUM: 133 mmol/L — AB (ref 135–145)
Sodium: 136 mmol/L (ref 135–145)
TOTAL PROTEIN: 6.3 g/dL — AB (ref 6.5–8.1)
Total Bilirubin: 1 mg/dL (ref 0.3–1.2)
Total Protein: 6.5 g/dL (ref 6.5–8.1)

## 2016-09-26 LAB — URINE CULTURE

## 2016-09-26 LAB — BASIC METABOLIC PANEL
ANION GAP: 11 (ref 5–15)
BUN: 19 mg/dL (ref 6–20)
CHLORIDE: 98 mmol/L — AB (ref 101–111)
CO2: 24 mmol/L (ref 22–32)
Calcium: 8.4 mg/dL — ABNORMAL LOW (ref 8.9–10.3)
Creatinine, Ser: 0.77 mg/dL (ref 0.44–1.00)
Glucose, Bld: 128 mg/dL — ABNORMAL HIGH (ref 65–99)
POTASSIUM: 3.1 mmol/L — AB (ref 3.5–5.1)
SODIUM: 133 mmol/L — AB (ref 135–145)

## 2016-09-26 LAB — PROTIME-INR
INR: 8.06 — AB
PROTHROMBIN TIME: 70.1 s — AB (ref 11.4–15.2)

## 2016-09-26 LAB — GLUCOSE, CAPILLARY
GLUCOSE-CAPILLARY: 142 mg/dL — AB (ref 65–99)
GLUCOSE-CAPILLARY: 115 mg/dL — AB (ref 65–99)
GLUCOSE-CAPILLARY: 151 mg/dL — AB (ref 65–99)
Glucose-Capillary: 117 mg/dL — ABNORMAL HIGH (ref 65–99)
Glucose-Capillary: 140 mg/dL — ABNORMAL HIGH (ref 65–99)
Glucose-Capillary: 155 mg/dL — ABNORMAL HIGH (ref 65–99)

## 2016-09-26 LAB — CBC
HCT: 43.6 % (ref 36.0–46.0)
Hemoglobin: 15.1 g/dL — ABNORMAL HIGH (ref 12.0–15.0)
MCH: 32.3 pg (ref 26.0–34.0)
MCHC: 34.6 g/dL (ref 30.0–36.0)
MCV: 93.2 fL (ref 78.0–100.0)
PLATELETS: 247 10*3/uL (ref 150–400)
RBC: 4.68 MIL/uL (ref 3.87–5.11)
RDW: 13.5 % (ref 11.5–15.5)
WBC: 29.1 10*3/uL — ABNORMAL HIGH (ref 4.0–10.5)

## 2016-09-26 LAB — HEMOGLOBIN A1C
HEMOGLOBIN A1C: 5.8 % — AB (ref 4.8–5.6)
Mean Plasma Glucose: 120 mg/dL

## 2016-09-26 LAB — MAGNESIUM: MAGNESIUM: 2 mg/dL (ref 1.7–2.4)

## 2016-09-26 MED ORDER — KCL IN DEXTROSE-NACL 20-5-0.9 MEQ/L-%-% IV SOLN
INTRAVENOUS | Status: DC
  Administered 2016-09-26 – 2016-09-28 (×5): via INTRAVENOUS
  Filled 2016-09-26 (×5): qty 1000

## 2016-09-26 MED ORDER — VITAMIN K1 10 MG/ML IJ SOLN
10.0000 mg | Freq: Once | INTRAVENOUS | Status: AC
  Administered 2016-09-26: 10 mg via INTRAVENOUS
  Filled 2016-09-26: qty 1

## 2016-09-26 NOTE — Progress Notes (Signed)
[facility] PROGRESS NOTE  Brooke BoschMarlana W Sweeney UJW:[id number]:[id number] DOB: 12/16/1960 DOA: 09/15/2016 PCP: Ignatius Speckinghruv B Vyas, MD  Interim summary and HPI 55 y.o. 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 She was recently discharged on 10/20 for undergoing laparoscopic cholecystectomy for acute cholecystitis. During that hospitalization her chronic medical problems remain stable. She was readmitted back to the hospital on 10/23 for complaints of abdominal pain with associated nausea vomiting. She was found to have a fluid collection consistent with likely abscess in the gallbladder fossa and underwent CT-guided percutaneous drain placement on 10/29. She is on chronic warfarin and required discontinuation of her warfarin and supplementation with IV heparin before procedure could be performed. During previous hospitalization patient received Zosyn for 7 days. During that hospitalization she received Cipro for 3 days. There was no growth on any blood or fluid culture so antibiotics were discontinued. Unfortunately she has had issues with persistent diarrhea with nausea and vomiting and persistent central abdominal pain without reflux. Urinalysis appeared unremarkable but this was obtained while patient was on antibiotics. She has been unable to eat any solids although has been able to keep some clear liquids down. Her blood pressure has been reported as difficult to control. C. difficile PCR was negative. Because of reported uncontrolled blood pressure and persistent nausea and vomiting,internal medical assistance has been requested.  Assessment/Plan:  Abscess of gallbladder fossa s/p perc drainage 09/21/2016 post Acute cholecystitis s/p lap  cholecystectomy 09/08/2016 -Per primary surgical team -Blood cultures previous admission no growth -Culture from fluid obtained from percutaneous drain currently without growth -patient received 7 days of zosyn and 3 days of ciprofloxacin    Leukocytosis -New increase in white count to 29,000 in setting of acute kidney injury and marked dehydration -Patient reports dark urine -Follow labs -No sources of infection appreciated -recent CXR, abd x-ray and CT abdomen w/o acute abnormalities to explain findings -will recommend monitor off antibiotics for now    Nausea with vomiting/Abdominal pain/diarrhea -abd x-ray and CT suggesting ileus -Patient reporting improvement in loose stools -C. difficile negative -will continue holding laxatives now -continue PRN pepcid    Acute kidney injury  -Mild and likely related to volume depletion from ongoing poor oral intake associated with nausea vomiting and loose stools. -essentially back to baseline after IVF's -will continue IVF's for now -if needed will stop vasotec    Chronic systolic heart failure  -Currently compensated -Was not on diuretics prior to admission -Continue carvedilol    Hypertension -Moderately controlled in setting of ongoing abdominal pain and persistent nausea/vomiting -Patient BP has now stabilizes -will continue current antihypertensive regimen ad adjust further as needed     MITRAL VALVE REPLACEMENT, HX OF/Warfarin-induced coagulopathy  -Pharmacy managing warfarin -INR greater than 8 likely related to recent antibiotics, mild acute kidney injury -received Vit K today -will monitor level/trend -no signs of acute bleeding     CAD (coronary artery disease) -no CP or SOB -continue monitoring  -continue B-blocker       Anemia -Listed on previous discharge information Baseline hemoglobin 11.6 and current hemoglobin markedly elevated at 18.4 consistent with hemoconcentration and dehydration -will continue  IVF's -patient UA also concentrated     HYPERLIPIDEMIA, MIXED  -Not on medications prior to admission and currently not tolerating PO's  well    Chronic back pain -Was on oxycodone 5-10 mg oral at home -Not tolerating oral medications at this juncture -continue fentanyl and fentanyl patch   Code Status: Full Code Family Communication: no family at bedside  Disposition Plan: remains inpatient; continue IVF's, continue observation off antibiotics for now. Post operative care, diet advance and further decision on drain as per surgical service (primary service)  Procedures:  S/p laparoscopic cholecystectomy 09/08/16  IF percutaneous drain for presumed abscess in gallbladder fossa 09/21/16  Antibiotics:  None currently (but treated with 7 days of zosyn and 3 days of ciprofloxacin)  HPI/Subjective: Low grate temp overnight, denies nausea and vomiting. No CP or SOB. Overall with improvement in her abd pain and in no distress.   Objective: Vitals:   09/26/16 1300 09/26/16 1607  BP: 135/66 (!) 142/77  Pulse: 95 87  Resp: 16   Temp: 99 F (37.2 C)     Intake/Output Summary (Last 24 hours) at 09/26/16 1806 Last data filed at 09/26/16 1500  Gross per 24 hour  Intake           2845.5 ml  Output             17.5 ml  Net             2828 ml   Filed Weights   09/25/16 1505 09/26/16 0500  Weight: 67.5 kg (148 lb 14.4 oz) 64.5 kg (142 lb 1.6 oz)    Exam:   General:  Low grade temp overnight, no CP, no SOB. Reports improvement overall in her abd pain.  Cardiovascular: S1 and S2, no rubs, no gallops  Respiratory: no wheezing, no crackles, good air movement  Abdomen: soft, moderate tenderness across bilateral upper quadrants and epigastric area on palpation; positive BS  Musculoskeletal: no edema, no cyanosis    Data Reviewed: Basic Metabolic Panel:  Recent Labs Lab 09/21/16 0153 09/24/16 1201 09/25/16 1146 09/26/16 0227 09/26/16 0802 09/26/16 1427  NA 135 134*  133*  --  133* 133*  K 4.0 4.0 3.7  --  3.1* 3.3*  CL 101 98* 90*  --  98* 99*  CO2 24 21* 22  --  24 24  GLUCOSE 121* 164* 254*  --  128* 152*  BUN <5* 12 34*  --  19 15  CREATININE 0.76 1.17* 1.81*  --  0.77 0.71  CALCIUM 8.8* 10.1 10.2  --  8.4* 8.3*  MG  --   --   --  2.0  --   --    Liver Function Tests:  Recent Labs Lab 09/24/16 1201 09/25/16 1146 09/26/16 1427  AST 38 53* 36  ALT 27 25 21   ALKPHOS 96 107 66  BILITOT 0.9 1.2 1.4*  PROT 8.3* 7.9 6.3*  ALBUMIN 3.9 3.7 2.6*   CBC:  Recent Labs Lab 09/22/16 0100 09/23/16 0601 09/24/16 0637 09/25/16 0853 09/26/16 0227  WBC 14.1* 13.3* 15.8* 25.3* 29.1*  HGB 13.9 14.7 16.9* 18.4* 15.1*  HCT 41.3 44.7 49.1* 52.4* 43.6  MCV 96.3 99.1 95.2 94.4 93.2  PLT 309 276 335 304 247   CBG:  Recent Labs Lab 09/26/16 0045 09/26/16 0528 09/26/16 0804 09/26/16 1142 09/26/16 1618  GLUCAP 140* 117* 142* 115* 151*    Recent Results (from the past 240 hour(s))  C difficile quick scan w PCR reflex     Status: None   Collection Time: 09/17/16 12:49 PM  Result Value Ref Range Status   C Diff antigen NEGATIVE  NEGATIVE Final   C Diff toxin NEGATIVE NEGATIVE Final   C Diff interpretation No C. difficile detected.  Final  Aerobic Culture (superficial specimen)     Status: None   Collection Time: 09/21/16  1:16 PM  Result Value Ref Range Status   Specimen Description ABSCESS ABDOMEN  Final   Special Requests INTRO ABDOMINAL ABSCESS  Final   Gram Stain   Final    RARE WBC PRESENT, PREDOMINANTLY PMN NO ORGANISMS SEEN    Culture NO GROWTH 2 DAYS  Final   Report Status 09/23/2016 FINAL  Final  Culture, Urine     Status: Abnormal   Collection Time: 09/24/16  9:45 PM  Result Value Ref Range Status   Specimen Description URINE, RANDOM  Final   Special Requests NONE  Final   Culture MULTIPLE SPECIES PRESENT, SUGGEST RECOLLECTION (A)  Final   Report Status 09/26/2016 FINAL  Final  Culture, blood (Routine X 2) w Reflex to ID  Panel     Status: None (Preliminary result)   Collection Time: 09/25/16 11:40 AM  Result Value Ref Range Status   Specimen Description BLOOD RIGHT HAND  Final   Special Requests IN PEDIATRIC BOTTLE 3CC  Final   Culture NO GROWTH 1 DAY  Final   Report Status PENDING  Incomplete  Culture, blood (Routine X 2) w Reflex to ID Panel     Status: None (Preliminary result)   Collection Time: 09/25/16 11:50 AM  Result Value Ref Range Status   Specimen Description BLOOD LEFT HAND  Final   Special Requests IN PEDIATRIC BOTTLE 3CC  Final   Culture NO GROWTH 1 DAY  Final   Report Status PENDING  Incomplete     Studies: Ct Abdomen Pelvis Wo Contrast  Result Date: 09/25/2016 CLINICAL DATA:  Leukocytosis recent lap coli status post percutaneous drain for gallbladder fossa abscess increased abdominal pain EXAM: CT ABDOMEN AND PELVIS WITHOUT CONTRAST TECHNIQUE: Multidetector CT imaging of the abdomen and pelvis was performed following the standard protocol without IV contrast. COMPARISON:  Radiograph 09/25/2016, CT scan 09/21/2016 FINDINGS: Lower chest: Lung bases show no acute infiltrate or pleural effusion. Hepatobiliary: Focal hypodensity near the falciform ligament, may relate to focal fatty infiltration, noted on previous exams. There are no new focal hepatic abnormalities. Patient is status post cholecystectomy. There is interim placement of a pigtail drain in the gallbladder fossa. The previously noted gas and fluid containing collection has markedly decreased. There are a few residual gas bubbles at the gallbladder fossa. Stable mildly prominent extrahepatic common bowel duct. Pancreas: Unremarkable. No pancreatic ductal dilatation or surrounding inflammatory changes. Spleen: Normal in size without focal abnormality. Adrenals/Urinary Tract: Adrenal glands are unremarkable. Kidneys are normal, without renal calculi, focal lesion, or hydronephrosis. Bladder is unremarkable. Stomach/Bowel: There is thickening  of the pylorus and proximal duodenum. There are contrast filled loops of bowel without high-grade obstruction. Fluid-filled colon could relate to mild ileus. Vascular/Lymphatic: Atherosclerotic vascular calcification. No significant adenopathy. Reproductive: Uterus and bilateral adnexa are unremarkable. Other: No free air or free fluid. Musculoskeletal: No acute osseous abnormality. Degenerative changes most marked at L5-S1. IMPRESSION: 1. Interim placement of a pigtail drain within the right upper quadrant. Significant decreased fluid and gas collection at the gallbladder fossa with small residual gas bubbles noted. 2. Focal hypodensity near the falciform ligament, may represent focal fatty infiltration 3. Mild thickening of the pylorus and proximal duodenum could relate to a mild gastroduodenitis. 4. Few prominent loops of contrast filled small bowel an fluid-filled  colon, findings could relate to an ileus. No evidence for high-grade bowel obstruction. Electronically Signed   By: Jasmine Pang M.D.   On: 09/25/2016 20:36   Dg Abd 2 Views  Result Date: 09/25/2016 CLINICAL DATA:  Epigastric pain with nausea and vomiting. The patient has a catheter present on the right for intra-abdominal abscess drainage EXAM: ABDOMEN - 2 VIEW COMPARISON:  CT scan of September 21, 2016 FINDINGS: A few scattered air-fluid levels are noted in the right upper mid and lower abdomen on the upright view. There is no evidence of bowel obstruction. The stomach is not abnormally distended with air. No free extraluminal gas collections are observed. The right upper quadrant drainage catheter appears to be in reasonable position. The lung bases are clear. IMPRESSION: Mild ileuswhich is predominantly right-sided. No free extraluminal gas collections. Electronically Signed   By: David  [place]az[place] M.D.   On: 09/25/2016 13:25    Scheduled Meds: . acetaminophen  1,000 mg Oral TID  . carvedilol  12.5 mg Oral BID WC  . enalapril  5 mg Oral BID   . famotidine (PEPCID) IV  20 mg Intravenous Q12H  . fentaNYL  25 mcg Transdermal Q72H  . insulin aspart  0-9 Units Subcutaneous Q4H  . lip balm   Topical BID  . ondansetron (ZOFRAN) IV  4 mg Intravenous Q6H  . saccharomyces boulardii  250 mg Oral BID  . sodium chloride flush  3 mL Intravenous Q12H  . Warfarin - Pharmacist Dosing Inpatient   Does not apply q1800   Continuous Infusions: . dextrose 5 % and 0.9 % NaCl with KCl 20 mEq/L 100 mL/hr at 09/26/16 1358    Principal Problem:   Abscess of gallbladder fossa s/p perc drainage 09/21/2016 Active Problems:   HYPERLIPIDEMIA, MIXED   Coronary atherosclerosis   Chronic systolic heart failure (HCC)   MITRAL VALVE REPLACEMENT, HX OF   Chronic back pain   Leukocytosis   Acute cholecystitis s/p lap cholecystectomy 09/08/2016   Nausea with vomiting   Anxiety state   CAD (coronary artery disease)   Abdominal pain   Warfarin-induced coagulopathy (HCC)   Acute kidney injury (HCC)   Anemia   Intra-abdominal infection   Subhepatic fluid collection    Time spent: 25 minutes    Vassie Loll  [facility] Pager [phone]. If 7PM-7AM, please contact night-coverage at [email or web address], password [facility] 09/26/2016, 6:06 PM  LOS: 11 days

## 2016-09-26 NOTE — Progress Notes (Signed)
Dr. Magnus IvanBlackman and Vernard GamblesVeronda Bryk, Pharmacist notified of patient critical value INR 8.06 due to coumadin therapy for history of MVR. No active bleeding noted, Hgb stable. Pharmacist states "cutoff is 9, if no bleeding is present"; no new orders given.

## 2016-09-26 NOTE — Progress Notes (Signed)
Subjective: She looks much better than her labs would suggest.  Her only complaint is her epigastric pain as before.  She says if she eats to fast she vomits.  She had some loose stool yesterday after contrast, but the diarrhea issue seem to have resolved.  Her prior pain issues are related to her back and her neck.  She sleeps with a [name] bear under her neck, that is friend and neck pillow.  No SOB, no pain or discomfort anywhere else.   Objective: Vital signs in last 24 hours: Temp:  [97.4 F (36.3 C)-100.4 F (38 C)] 97.4 F (36.3 C) (11/03 0531) Pulse Rate:  [92-120] 92 (11/03 0825) Resp:  [17-18] 18 (11/03 0531) BP: (149-169)/(56-94) 153/75 (11/03 0825) SpO2:  [97 %-100 %] 97 % (11/03 0531) Weight:  [64.5 kg (142 lb 1.6 oz)-67.5 kg (148 lb 14.4 oz)] 64.5 kg (142 lb 1.6 oz) (11/03 0500) Last BM Date: 09/25/16 IV 1649 PO 600 Voided x 4 Drain 7.5 recorded One temp 100.4 BP is much better BMP is pending WBC is up to 29.1 INR is up to 8.06 Blood, urine cultures are pending Culture from GB fossa- no growth CT 09/25/16: 1. Interim placement of a pigtail drain within the right upper quadrant. Significant decreased fluid and gas collection at the gallbladder fossa with small residual gas bubbles noted. 2. Focal hypodensity near the falciform ligament, may represent focal fatty infiltration 3. Mild thickening of the pylorus and proximal duodenum could relate to a mild gastroduodenitis. 4. Few prominent loops of contrast filled small bowel an fluid-filled colon, findings could relate to an ileus. No evidence for high-grade bowel obstruction.  Intake/Output from previous day: 11/02 0701 - 11/03 0700 In: 2304.2 [P.O.:600; I.V.:1649.2; IV Piggyback:50] Out: 7.5 [Drains:7.5] Intake/Output this shift: Total I/O In: 123 [P.O.:120; I.V.:3] Out: -   General appearance: alert, cooperative and no distress Resp: clear to auscultation bilaterally Cardio: regular, with crisp valve  sounds GI: soft, no distension, she notes ongoing pain epigastric area, but no tenderness on palpation.+ BS, +BM. Port sites and drain site  Extremities: extremities normal, atraumatic, no cyanosis or edema  Lab Results:   Recent Labs  09/25/16 0853 09/26/16 0227  WBC 25.3* 29.1*  HGB 18.4* 15.1*  HCT 52.4* 43.6  PLT 304 247    BMET  Recent Labs  09/25/16 1146 09/26/16 0802  NA 133* 133*  K 3.7 3.1*  CL 90* 98*  CO2 22 24  GLUCOSE 254* 128*  BUN 34* 19  CREATININE 1.81* 0.77  CALCIUM 10.2 8.4*   PT/INR  Recent Labs  09/25/16 0853 09/26/16 0227  LABPROT 57.5* 70.1*  INR 6.29* 8.06*     Recent Labs Lab 09/24/16 1201 09/25/16 1146  AST 38 53*  ALT 27 25  ALKPHOS 96 107  BILITOT 0.9 1.2  PROT 8.3* 7.9  ALBUMIN 3.9 3.7     Lipase  No results found for: LIPASE   Studies/Results: Ct Abdomen Pelvis Wo Contrast  Result Date: 09/25/2016 CLINICAL DATA:  Leukocytosis recent lap coli status post percutaneous drain for gallbladder fossa abscess increased abdominal pain EXAM: CT ABDOMEN AND PELVIS WITHOUT CONTRAST TECHNIQUE: Multidetector CT imaging of the abdomen and pelvis was performed following the standard protocol without IV contrast. COMPARISON:  Radiograph 09/25/2016, CT scan 09/21/2016 FINDINGS: Lower chest: Lung bases show no acute infiltrate or pleural effusion. Hepatobiliary: Focal hypodensity near the falciform ligament, may relate to focal fatty infiltration, noted on previous exams. There are no new focal hepatic abnormalities.  Patient is status post cholecystectomy. There is interim placement of a pigtail drain in the gallbladder fossa. The previously noted gas and fluid containing collection has markedly decreased. There are a few residual gas bubbles at the gallbladder fossa. Stable mildly prominent extrahepatic common bowel duct. Pancreas: Unremarkable. No pancreatic ductal dilatation or surrounding inflammatory changes. Spleen: Normal in size without  focal abnormality. Adrenals/Urinary Tract: Adrenal glands are unremarkable. Kidneys are normal, without renal calculi, focal lesion, or hydronephrosis. Bladder is unremarkable. Stomach/Bowel: There is thickening of the pylorus and proximal duodenum. There are contrast filled loops of bowel without high-grade obstruction. Fluid-filled colon could relate to mild ileus. Vascular/Lymphatic: Atherosclerotic vascular calcification. No significant adenopathy. Reproductive: Uterus and bilateral adnexa are unremarkable. Other: No free air or free fluid. Musculoskeletal: No acute osseous abnormality. Degenerative changes most marked at L5-S1. IMPRESSION: 1. Interim placement of a pigtail drain within the right upper quadrant. Significant decreased fluid and gas collection at the gallbladder fossa with small residual gas bubbles noted. 2. Focal hypodensity near the falciform ligament, may represent focal fatty infiltration 3. Mild thickening of the pylorus and proximal duodenum could relate to a mild gastroduodenitis. 4. Few prominent loops of contrast filled small bowel an fluid-filled colon, findings could relate to an ileus. No evidence for high-grade bowel obstruction. Electronically Signed   By: Jasmine PangKim  Fujinaga M.D.   On: 09/25/2016 20:36   Dg Abd 2 Views  Result Date: 09/25/2016 CLINICAL DATA:  Epigastric pain with nausea and vomiting. The patient has a catheter present on the right for intra-abdominal abscess drainage EXAM: ABDOMEN - 2 VIEW COMPARISON:  CT scan of September 21, 2016 FINDINGS: A few scattered air-fluid levels are noted in the right upper mid and lower abdomen on the upright view. There is no evidence of bowel obstruction. The stomach is not abnormally distended with air. No free extraluminal gas collections are observed. The right upper quadrant drainage catheter appears to be in reasonable position. The lung bases are clear. IMPRESSION: Mild ileuswhich is predominantly right-sided. No free extraluminal  gas collections. Electronically Signed   By: David  [place]azilandJordan M.D.   On: 09/25/2016 13:25    Medications: . acetaminophen  1,000 mg Oral TID  . carvedilol  12.5 mg Oral BID WC  . enalapril  5 mg Oral BID  . famotidine (PEPCID) IV  20 mg Intravenous Q12H  . fentaNYL  25 mcg Transdermal Q72H  . insulin aspart  0-9 Units Subcutaneous Q4H  . lip balm   Topical BID  . ondansetron (ZOFRAN) IV  4 mg Intravenous Q6H  . saccharomyces boulardii  250 mg Oral BID  . sodium chloride flush  3 mL Intravenous Q12H  . Warfarin - Pharmacist Dosing Inpatient   Does not apply q1800   . dextrose 5 % and 0.9 % NaCl with KCl 20 mEq/L      Assessment/Plan Abdominal pain, Nausea, probably secondary to some subhepatic fluid collection S/p laparoscopic cholecystectomy 09/08/16, Dr. Ezzard StandingNewman IR placement of drain 09/21/16 - no growth on fluid cultured [name] and left main stent on coumadin - INR  8.06- Heparin protocol CVA 09/2012 Hx of chronic pain on Oxycodone for 2 years not previously reported New leukocytosis Supra-theraputic INR up to 8.06 - Discussed with pharmacy, she has Vit K 10 mg IV running now Dehydration - continuing IV hydration Acute renal insuffiencey - BMP pending FEN: start clear liquids/IV fluids - allow her fulls if she feels better. ID: she had 7 days of Zosyn 10/13-10/19/17  ciprofloxacin day 3 09/17/16, then discontinued. C diff negative 09/17/16 - No growth on Blood or fluid cultures no growth on GB fossa fluid VTE: heparin per pharmacy, drip discontinuedINR 2.12 - 3.98 - 6.29  - today 8.06  Discussed with pharmacy, Vit K 10 mg IV infusing- they will continue to follow, she will need to go back on heparin once we get her INR down.    Plan: Discussed with Dr. Gwenlyn Perking, continue hydration.  BMP is pending and Vit K reversed.  CXR on 09/24/16 was normal and consistent with exam.     LOS: 11 days    [name] 09/26/2016 [phone]

## 2016-09-26 NOTE — Progress Notes (Signed)
ANTICOAGULATION CONSULT NOTE - Follow Up Consult  Pharmacy Consult:  Heparin / Coumadin Indication: MVR  Allergies  Allergen Reactions  . Cyclobenzaprine Hcl Nausea And Vomiting  . Latex Other (See Comments)    Unknown   Patient Measurements: Heparin Dosing Weight: 70kg  Vital Signs: Temp: 97.4 F (36.3 C) (11/03 0531) Temp Source: Oral (11/03 0531) BP: 153/75 (11/03 0825) Pulse Rate: 92 (11/03 0825)  Labs:  Recent Labs  09/24/16 [id number] 09/24/16 1201 09/25/16 0853 09/25/16 1146 09/26/16 0227 09/26/16 0802  HGB 16.9*  --  18.4*  --  15.1*  --   HCT 49.1*  --  52.4*  --  43.6  --   PLT 335  --  304  --  247  --   LABPROT 39.9*  --  57.5*  --  70.1*  --   INR 3.98  --  6.29*  --  8.06*  --   HEPARINUNFRC 0.91*  --   --   --   --   --   CREATININE  --  1.17*  --  1.81*  --  0.77    Estimated Creatinine Clearance: 69.4 mL/min (by C-G formula based on SCr of 0.77 mg/dL).   Assessment: [name] on Coumadin 10mg  daily exc for 7.5mg  on Mon/Thurs PTA for MVR (goal 2.5 - 3.5) Coumadin resumed on 10/30 for only 2 days with heparin bridge and then held as INR continued to trend up. Current INR up to 8.06. No known liver dysfunction (LFTs and Tbili wnl) Patient does have ongoing nausea and unable to eat. Was recently on cipro. No bleeding noted but Hgb is down to 15.1 today, plts wnl.  Goal of Therapy:  INR 2.5-3.5 Monitor platelets by anticoagulation protocol: Yes   Plan: Give Vit K 10mg  IV x 1 today (discussed with surgery) Hold Coumadin today Monitor daily INR, CBC, s/s of bleed F/U transitioning to heparin when INR subtherapeutic  Enzo BiNathan Rashard Ryle, PharmD, [facility] Hospital[facility]PS Clinical Pharmacist Pager [phone] 09/26/2016 10:21 AM

## 2016-09-26 NOTE — Progress Notes (Signed)
Dr. Craige CottaKirby paged to notify of critical lab this AM of INR 8.06.

## 2016-09-26 NOTE — Progress Notes (Signed)
Referring Physician(s): Dr Marcille Blanco  Supervising Physician: Ruel Favors  Patient Status:  [facility] - In-pt  Chief Complaint:  GB fossa drain placed 10/29  Subjective:  abd pain worsened for few days Wbc increased CT 11/2: IMPRESSION: 1. Interim placement of a pigtail drain within the right upper quadrant. Significant decreased fluid and gas collection at the gallbladder fossa with small residual gas bubbles noted. 2. Focal hypodensity near the falciform ligament, may represent focal fatty infiltration 3. Mild thickening of the pylorus and proximal duodenum could relate to a mild gastroduodenitis. 4. Few prominent loops of contrast filled small bowel an fluid-filled colon, findings could relate to an ileus. No evidence for high-grade bowel obstruction.  Diarrhea since yesterday  Allergies: Cyclobenzaprine hcl and Latex  Medications: Prior to Admission medications   Medication Sig Start Date End Date Taking? Authorizing Provider  carvedilol (COREG) 6.25 MG tablet Take 1 tablet (6.25 mg total) by mouth 2 (two) times daily with a meal. 09/12/16  Yes Shanker Levora Dredge, MD  HYDROcodone-acetaminophen (NORCO/VICODIN) 5-325 MG tablet Take 1 tablet by mouth 3 (three) times daily as needed for moderate pain. 09/12/16  Yes Shanker Levora Dredge, MD  Melatonin 3 MG CAPS Take 3 mg by mouth at bedtime as needed (for sleep).   Yes Historical Provider, MD  promethazine (PHENERGAN) 25 MG tablet Take 25 mg by mouth 2 (two) times daily as needed for nausea or vomiting.  02/26/12  Yes Historical Provider, MD  warfarin (COUMADIN) 5 MG tablet Take 2.5 mg once on 10/21 Start taking your usual regimen on 10/22 (7.5 mg by mouth on Monday and Thursday. Take 10mg  by mouth on all other days) Go to Dr Bonnita Levan office on 10/23 for INR check-and ask Dr Sherril Croon for further dosing instructions 09/12/16  Yes Shanker Levora Dredge, MD  NITROSTAT 0.4 MG SL tablet DISSOLVE ONE TABLET UNDER TONGUE EVERY 5 MINUTES UP TO 3  DOSES AS NEEDED FOR CHEST PAIN Patient taking differently: DISSOLVE 0.4 MG UNDER TONGUE EVERY 5 MINUTES UP TO 3 DOSES AS NEEDED FOR CHEST PAIN 03/22/15   Jonelle Sidle, MD     Vital Signs: BP (!) 166/56 (BP Location: Left Arm)   Pulse 97   Temp 97.4 F (36.3 C) (Oral)   Resp 18   Wt 142 lb 1.6 oz (64.5 kg)   SpO2 97%   BMI 24.39 kg/m   Physical Exam  Constitutional: She is oriented to person, place, and time.  Abdominal: Soft. Bowel sounds are normal. There is tenderness.  Neurological: She is alert and oriented to person, place, and time.  Skin: Skin is warm and dry.  Output minimal Cx NG Wbc 29.1 afeb CT shows improvement in collection  Nursing note and vitals reviewed.   Imaging: Ct Abdomen Pelvis Wo Contrast  Result Date: 09/25/2016 CLINICAL DATA:  Leukocytosis recent lap coli status post percutaneous drain for gallbladder fossa abscess increased abdominal pain EXAM: CT ABDOMEN AND PELVIS WITHOUT CONTRAST TECHNIQUE: Multidetector CT imaging of the abdomen and pelvis was performed following the standard protocol without IV contrast. COMPARISON:  Radiograph 09/25/2016, CT scan 09/21/2016 FINDINGS: Lower chest: Lung bases show no acute infiltrate or pleural effusion. Hepatobiliary: Focal hypodensity near the falciform ligament, may relate to focal fatty infiltration, noted on previous exams. There are no new focal hepatic abnormalities. Patient is status post cholecystectomy. There is interim placement of a pigtail drain in the gallbladder fossa. The previously noted gas and fluid containing collection has markedly decreased. There are  a few residual gas bubbles at the gallbladder fossa. Stable mildly prominent extrahepatic common bowel duct. Pancreas: Unremarkable. No pancreatic ductal dilatation or surrounding inflammatory changes. Spleen: Normal in size without focal abnormality. Adrenals/Urinary Tract: Adrenal glands are unremarkable. Kidneys are normal, without renal calculi,  focal lesion, or hydronephrosis. Bladder is unremarkable. Stomach/Bowel: There is thickening of the pylorus and proximal duodenum. There are contrast filled loops of bowel without high-grade obstruction. Fluid-filled colon could relate to mild ileus. Vascular/Lymphatic: Atherosclerotic vascular calcification. No significant adenopathy. Reproductive: Uterus and bilateral adnexa are unremarkable. Other: No free air or free fluid. Musculoskeletal: No acute osseous abnormality. Degenerative changes most marked at L5-S1. IMPRESSION: 1. Interim placement of a pigtail drain within the right upper quadrant. Significant decreased fluid and gas collection at the gallbladder fossa with small residual gas bubbles noted. 2. Focal hypodensity near the falciform ligament, may represent focal fatty infiltration 3. Mild thickening of the pylorus and proximal duodenum could relate to a mild gastroduodenitis. 4. Few prominent loops of contrast filled small bowel an fluid-filled colon, findings could relate to an ileus. No evidence for high-grade bowel obstruction. Electronically Signed   By: Jasmine PangKim  Fujinaga M.D.   On: 09/25/2016 20:36   Dg Chest 2 View  Result Date: 09/24/2016 CLINICAL DATA:  Elevated white count. Epigastric pain and vomiting. Recent gallbladder surgery. EXAM: CHEST  2 VIEW COMPARISON:  09/02/2016. FINDINGS: Normal heart size with clear lung fields. Prior median sternotomy for valve replacement. No active infiltrates or failure. Pigtail catheter RIGHT upper quadrant adjacent to cholecystectomy clips. IMPRESSION: No active cardiopulmonary disease. Electronically Signed   By: Elsie StainJohn T Curnes M.D.   On: 09/24/2016 11:19   Dg Abd 2 Views  Result Date: 09/25/2016 CLINICAL DATA:  Epigastric pain with nausea and vomiting. The patient has a catheter present on the right for intra-abdominal abscess drainage EXAM: ABDOMEN - 2 VIEW COMPARISON:  CT scan of September 21, 2016 FINDINGS: A few scattered air-fluid levels are noted  in the right upper mid and lower abdomen on the upright view. There is no evidence of bowel obstruction. The stomach is not abnormally distended with air. No free extraluminal gas collections are observed. The right upper quadrant drainage catheter appears to be in reasonable position. The lung bases are clear. IMPRESSION: Mild ileuswhich is predominantly right-sided. No free extraluminal gas collections. Electronically Signed   By: David  SwazilandJordan M.D.   On: 09/25/2016 13:25    Labs:  CBC:  Recent Labs  09/23/16 0601 09/24/16 0637 09/25/16 0853 09/26/16 0227  WBC 13.3* 15.8* 25.3* 29.1*  HGB 14.7 16.9* 18.4* 15.1*  HCT 44.7 49.1* 52.4* 43.6  PLT 276 335 304 247    COAGS:  Recent Labs  09/04/16 0515  09/15/16 2224  09/23/16 0601 09/24/16 0637 09/25/16 0853 09/26/16 0227  INR 1.83  < > 3.08  < > 2.12 3.98 6.29* 8.06*  APTT 92*  --  51*  --   --   --   --   --   < > = values in this interval not displayed.  BMP:  Recent Labs  09/18/16 0552 09/21/16 0153 09/24/16 1201 09/25/16 1146  NA 137 135 134* 133*  K 4.6 4.0 4.0 3.7  CL 105 101 98* 90*  CO2 25 24 21* 22  GLUCOSE 114* 121* 164* 254*  BUN <5* <5* 12 34*  CALCIUM 8.5* 8.8* 10.1 10.2  CREATININE 0.91 0.76 1.17* 1.81*  GFRNONAA >60 >60 52* 31*  GFRAA >60 >60 >60 35*  LIVER FUNCTION TESTS:  Recent Labs  09/15/16 2224 09/16/16 1135 09/24/16 1201 09/25/16 1146  BILITOT 0.6 0.6 0.9 1.2  AST 22 30 38 53*  ALT 20 17 27 25   ALKPHOS 119 108 96 107  PROT 7.1 6.5 8.3* 7.9  ALBUMIN 3.1* 3.0* 3.9 3.7    Assessment and Plan:  GB fossa drain placed 10/29 We will follow Plan per [facility] Consider drain removal when output scant   Electronically Signed: Dax Murguia A 09/26/2016, 7:51 AM   I spent a total of 15 Minutes at the the patient's bedside AND on the patient's hospital floor or unit, greater than 50% of which was counseling/coordinating care for GB fossa drain

## 2016-09-26 NOTE — Evaluation (Signed)
Physical Therapy Evaluation Patient Details Name: [name] MRN: [id number] DOB: 09-Aug-1961 Today's Date: 09/26/2016   History of Present Illness  55 y.o. female admitted 09/15/16 with abdominal pain, N/V.  Patient with abscess of gallbladder fossa s/p percutaneous drainage.      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 She was recently discharged on 10/20 for undergoing laparoscopic cholecystectomy for acute cholecystitis    Clinical Impression  Patient is functioning at supervision level with mobility and gait.  Able to ambulate 200' with no assistive device.  Patient reports this was her 3rd time walking in hallway today.  Encouraged patient to continue mobility and walking in hallway with supervision.  No acute PT needs identified - PT will sign off.    Follow Up Recommendations No PT follow up;Supervision for mobility/OOB    Equipment Recommendations  None recommended by PT    Recommendations for Other Services       Precautions / Restrictions Precautions Precautions: None Restrictions Weight Bearing Restrictions: No      Mobility  Bed Mobility Overal bed mobility: Independent                Transfers Overall transfer level: Independent Equipment used: None                Ambulation/Gait Ambulation/Gait assistance: Supervision Ambulation Distance (Feet): 200 Feet Assistive device: None (Pushing IV pole) Gait Pattern/deviations: Step-through pattern;Decreased stride length   Gait velocity interpretation: Below normal speed for age/gender General Gait Details: Patient with slow steady gait.  Stairs            Wheelchair Mobility    Modified Rankin (Stroke Patients Only)       Balance Overall balance assessment: No apparent  balance deficits (not formally assessed)                                           Pertinent Vitals/Pain Pain Assessment: 0-10 Pain Score: 4  Pain Location: Center of abdomen Pain Descriptors / Indicators: Discomfort Pain Intervention(s): Monitored during session    Home Living Family/patient expects to be discharged to:: Private residence Living Arrangements: Children (Daughter) Available Help at Discharge: Family;Available 24 hours/day Type of Home: House Home Access: Stairs to enter Entrance Stairs-Rails: Right Entrance Stairs-Number of Steps: 2 Home Layout: One level Home Equipment: Shower seat;Grab bars - tub/shower      Prior Function Level of Independence: Independent               Hand Dominance        Extremity/Trunk Assessment   Upper Extremity Assessment: Overall WFL for tasks assessed           Lower Extremity Assessment: Generalized weakness         Communication   Communication: No difficulties  Cognition Arousal/Alertness: Awake/alert Behavior During Therapy: WFL for tasks assessed/performed;Restless Overall Cognitive Status: Within Functional Limits for tasks assessed                      General Comments      Exercises     Assessment/Plan    PT Assessment Patent does not need any further PT services  PT Problem List  PT Treatment Interventions      PT Goals (Current goals can be found in the Care Plan section)  Acute Rehab PT Goals PT Goal Formulation: All assessment and education complete, DC therapy    Frequency     Barriers to discharge        Co-evaluation               End of Session Equipment Utilized During Treatment: Gait belt Activity Tolerance: Patient tolerated treatment well Patient left: in bed;with call bell/phone within reach Nurse Communication: Mobility status         Time: 4540-98111808-1822 PT Time Calculation (min) (ACUTE ONLY): 14 min   Charges:    PT Evaluation $PT Eval Moderate Complexity: 1 Procedure     PT G CodesVena Austria:        Jens Siems H 09/26/2016, 7:19 PM Durenda HurtSusan H. Renaldo Fiddleravis, PT, [facility] Acute Rehab Services Pager [phone]

## 2016-09-26 NOTE — Consult Note (Signed)
[facility]       Reason for Consult: fever    Referring Physician: Dr. Abbey Chattersosenbower  Principal Problem:   Abscess of gallbladder fossa s/p perc drainage 09/21/2016 Active Problems:   HYPERLIPIDEMIA, MIXED   Coronary atherosclerosis   Chronic systolic heart failure (HCC)   MITRAL VALVE REPLACEMENT, HX OF   Chronic back pain   Leukocytosis   Acute cholecystitis s/p lap cholecystectomy 09/08/2016   Nausea with vomiting   Anxiety state   CAD (coronary artery disease)   Abdominal pain   Warfarin-induced coagulopathy (HCC)   Acute kidney injury (HCC)   Anemia   Intra-abdominal infection   Subhepatic fluid collection   . acetaminophen  1,000 mg Oral TID  . carvedilol  12.5 mg Oral BID WC  . enalapril  5 mg Oral BID  . famotidine (PEPCID) IV  20 mg Intravenous Q12H  . fentaNYL  25 mcg Transdermal Q72H  . insulin aspart  0-9 Units Subcutaneous Q4H  . lip balm   Topical BID  . ondansetron (ZOFRAN) IV  4 mg Intravenous Q6H  . saccharomyces boulardii  250 mg Oral BID  . sodium chloride flush  3 mL Intravenous Q12H  . Warfarin - Pharmacist Dosing Inpatient   Does not apply q1800    Recommendations: Continue to observe off of antibiotics   Assessment: She has leukocytosis and mild fever to 100.4 without a known source of infection. No symptoms to lead us to a source.  Drain without organisms.    Dr Drue SecondSnider will follow up over the weekend.    Antibiotics: None   HPI: [name] is a 55 y.o. female with history of MVR, chronic warfarin, history of CVA, CAD and chronic pain previously on narcotics who had a lap chole on 10/20.  She represented to the hospital on 10/23 with pain and fluid collection and drained but no culture grew and only rare WBCs on gram stain.  She has had persistent mid epigastric pain, diarrhea but no sob, no dysuria.  She had a fever as above and leukocytosis but overall feels better. WBC has steadily climbed from 13 to 15  to 25 and now 2529.  No rashes, no joint swelling though gets some swelling with her arthritis.     Review of Systems:  Constitutional: negative for chills Respiratory: negative for cough, sputum or wheezing Cardiovascular: negative for chest pressure/discomfort All other systems reviewed and are negative   Past Medical History:  Diagnosis Date  . Anxiety   . Arthritis   . Asthma   . Cardiomyopathy (HCC)   . Chronic back pain   . COPD (chronic obstructive pulmonary disease) (HCC)   . Essential hypertension   . Fibromyalgia   . History of TIA (transient ischemic attack)   . Hyperlipidemia   . Migraine    Recurrent  . Myocardial infarction, anterior wall (HCC) 2011   Late reperfusion occluded LAD treated with BMS complicated by catheter-induced left mainstem dissection also treated with BMS  . Panic attacks   . Rheumatic heart disease    St. Jude MVR 2007  . Spina bifida [facility](HCC)     Social History  Substance Use Topics  . Smoking status: Former Smoker    Packs/day: 0.25    Years: 38.00    Types: Cigarettes    Quit date: 08/28/2016  . Smokeless tobacco: Never Used  . Alcohol use Yes     Comment: 09/03/2016 "drank some; quit after my 1st  child was born in [id number]"    Family History  Problem Relation Age of Onset  . Heart attack Father 3055  . Heart attack Mother 5372  . Coronary artery disease Sister     Allergies  Allergen Reactions  . Cyclobenzaprine Hcl Nausea And Vomiting  . Latex Other (See Comments)    Unknown    Physical Exam: Constitutional: in no apparent distress and alert  Vitals:   09/26/16 1300 09/26/16 1607  BP: 135/66 (!) 142/77  Pulse: 95 87  Resp: 16   Temp: 99 F (37.2 C)    EYES: anicteric ENMT: no thrush Cardiovascular: Cor RRR Respiratory: CTA B; normal respiratory effort GI: Bowel sounds are normal, liver is not enlarged, spleen is not enlarged Musculoskeletal: no pedal edema noted Skin: negatives: no rash Hematologic: no cervical  lad  Lab Results  Component Value Date   WBC 29.1 (H) 09/26/2016   HGB 15.1 (H) 09/26/2016   HCT 43.6 09/26/2016   MCV 93.2 09/26/2016   PLT 247 09/26/2016    Lab Results  Component Value Date   CREATININE 0.71 09/26/2016   BUN 15 09/26/2016   NA 133 (L) 09/26/2016   K 3.3 (L) 09/26/2016   CL 99 (L) 09/26/2016   CO2 24 09/26/2016    Lab Results  Component Value Date   ALT 21 09/26/2016   AST 36 09/26/2016   ALKPHOS 66 09/26/2016     Microbiology: Recent Results (from the past 240 hour(s))  C difficile quick scan w PCR reflex     Status: None   Collection Time: 09/17/16 12:49 PM  Result Value Ref Range Status   C Diff antigen NEGATIVE NEGATIVE Final   C Diff toxin NEGATIVE NEGATIVE Final   C Diff interpretation No C. difficile detected.  Final  Aerobic Culture (superficial specimen)     Status: None   Collection Time: 09/21/16  1:16 PM  Result Value Ref Range Status   Specimen Description ABSCESS ABDOMEN  Final   Special Requests INTRO ABDOMINAL ABSCESS  Final   Gram Stain   Final    RARE WBC PRESENT, PREDOMINANTLY PMN NO ORGANISMS SEEN    Culture NO GROWTH 2 DAYS  Final   Report Status 09/23/2016 FINAL  Final  Culture, Urine     Status: Abnormal   Collection Time: 09/24/16  9:45 PM  Result Value Ref Range Status   Specimen Description URINE, RANDOM  Final   Special Requests NONE  Final   Culture MULTIPLE SPECIES PRESENT, SUGGEST RECOLLECTION (A)  Final   Report Status 09/26/2016 FINAL  Final  Culture, blood (Routine X 2) w Reflex to ID Panel     Status: None (Preliminary result)   Collection Time: 09/25/16 11:40 AM  Result Value Ref Range Status   Specimen Description BLOOD RIGHT HAND  Final   Special Requests IN PEDIATRIC BOTTLE 3CC  Final   Culture NO GROWTH 1 DAY  Final   Report Status PENDING  Incomplete  Culture, blood (Routine X 2) w Reflex to ID Panel     Status: None (Preliminary result)   Collection Time: 09/25/16 11:50 AM  Result Value Ref Range  Status   Specimen Description BLOOD LEFT HAND  Final   Special Requests IN PEDIATRIC BOTTLE 3CC  Final   Culture NO GROWTH 1 DAY  Final   Report Status PENDING  Incomplete    Vania Rosero, Molly MaduroOBERT, MD [facility] [email or web address] [phone] pager  [phone] cell 09/26/2016,  4:15 PM

## 2016-09-27 DIAGNOSIS — R1011 Right upper quadrant pain: Secondary | ICD-10-CM

## 2016-09-27 LAB — GLUCOSE, CAPILLARY
GLUCOSE-CAPILLARY: 107 mg/dL — AB (ref 65–99)
GLUCOSE-CAPILLARY: 144 mg/dL — AB (ref 65–99)
GLUCOSE-CAPILLARY: 149 mg/dL — AB (ref 65–99)
GLUCOSE-CAPILLARY: 152 mg/dL — AB (ref 65–99)
GLUCOSE-CAPILLARY: 175 mg/dL — AB (ref 65–99)
GLUCOSE-CAPILLARY: 45 mg/dL — AB (ref 65–99)
GLUCOSE-CAPILLARY: 51 mg/dL — AB (ref 65–99)
Glucose-Capillary: 118 mg/dL — ABNORMAL HIGH (ref 65–99)
Glucose-Capillary: 45 mg/dL — ABNORMAL LOW (ref 65–99)

## 2016-09-27 LAB — HEPARIN LEVEL (UNFRACTIONATED): HEPARIN UNFRACTIONATED: 0.3 [IU]/mL (ref 0.30–0.70)

## 2016-09-27 LAB — URINE CULTURE

## 2016-09-27 LAB — CBC
HCT: 40.8 % (ref 36.0–46.0)
HEMOGLOBIN: 14.1 g/dL (ref 12.0–15.0)
MCH: 32.4 pg (ref 26.0–34.0)
MCHC: 34.6 g/dL (ref 30.0–36.0)
MCV: 93.8 fL (ref 78.0–100.0)
PLATELETS: 220 10*3/uL (ref 150–400)
RBC: 4.35 MIL/uL (ref 3.87–5.11)
RDW: 13.5 % (ref 11.5–15.5)
WBC: 21 10*3/uL — AB (ref 4.0–10.5)

## 2016-09-27 LAB — PROTIME-INR
INR: 1.35
Prothrombin Time: 16.8 seconds — ABNORMAL HIGH (ref 11.4–15.2)

## 2016-09-27 LAB — H. PYLORI ANTIBODY, IGG: H Pylori IgG: 4.4 U/mL — ABNORMAL HIGH (ref 0.0–0.8)

## 2016-09-27 MED ORDER — CARVEDILOL 25 MG PO TABS
25.0000 mg | ORAL_TABLET | Freq: Two times a day (BID) | ORAL | Status: DC
  Administered 2016-09-27 – 2016-10-16 (×29): 25 mg via ORAL
  Filled 2016-09-27 (×34): qty 1

## 2016-09-27 MED ORDER — BOOST / RESOURCE BREEZE PO LIQD
1.0000 | Freq: Three times a day (TID) | ORAL | Status: DC
  Administered 2016-09-27 – 2016-10-14 (×34): 1 via ORAL

## 2016-09-27 MED ORDER — DIPHENOXYLATE-ATROPINE 2.5-0.025 MG PO TABS
1.0000 | ORAL_TABLET | Freq: Four times a day (QID) | ORAL | Status: DC | PRN
  Administered 2016-09-27 – 2016-10-03 (×6): 1 via ORAL
  Filled 2016-09-27 (×6): qty 1

## 2016-09-27 MED ORDER — HEPARIN (PORCINE) IN NACL 100-0.45 UNIT/ML-% IJ SOLN
1050.0000 [IU]/h | INTRAMUSCULAR | Status: DC
  Administered 2016-09-27: 1050 [IU]/h via INTRAVENOUS
  Filled 2016-09-27: qty 250

## 2016-09-27 MED ORDER — DEXTROSE 50 % IV SOLN
INTRAVENOUS | Status: AC
  Administered 2016-09-27: 50 mL
  Filled 2016-09-27: qty 50

## 2016-09-27 MED ORDER — WARFARIN SODIUM 7.5 MG PO TABS
7.5000 mg | ORAL_TABLET | Freq: Once | ORAL | Status: DC
  Filled 2016-09-27: qty 1

## 2016-09-27 MED ORDER — HEPARIN (PORCINE) IN NACL 100-0.45 UNIT/ML-% IJ SOLN
1100.0000 [IU]/h | INTRAMUSCULAR | Status: DC
  Filled 2016-09-27 (×2): qty 250

## 2016-09-27 MED ORDER — DEXTROSE 50 % IV SOLN
50.0000 mL | Freq: Once | INTRAVENOUS | Status: DC
Start: 2016-09-27 — End: 2016-10-05

## 2016-09-27 NOTE — Progress Notes (Signed)
Leukocytosis continues to improve. No recent positive cultures. She remains afebrile.   - recommend to continue to monitor off of abtx for now - will continue to see her tomorrow

## 2016-09-27 NOTE — Progress Notes (Addendum)
[facility] PROGRESS NOTE  [name] WJX:[id number] DOB: 02-15-1961 DOA: 09/15/2016 PCP: Ignatius Specking, MD  Interim summary and HPI 55 y.o. 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 She was recently discharged on 10/20 for undergoing laparoscopic cholecystectomy for acute cholecystitis. During that hospitalization her chronic medical problems remain stable. She was readmitted back to the hospital on 10/23 for complaints of abdominal pain with associated nausea vomiting. She was found to have a fluid collection consistent with likely abscess in the gallbladder fossa and underwent CT-guided percutaneous drain placement on 10/29. She is on chronic warfarin and required discontinuation of her warfarin and supplementation with IV heparin before procedure could be performed. During previous hospitalization patient received Zosyn for 7 days. During that hospitalization she received Cipro for 3 days. There was no growth on any blood or fluid culture so antibiotics were discontinued. Unfortunately she has had issues with persistent diarrhea with nausea and vomiting and persistent central abdominal pain without reflux. Urinalysis appeared unremarkable but this was obtained while patient was on antibiotics. She has been unable to eat any solids although has been able to keep some clear liquids down. Her blood pressure has been reported as difficult to control. C. difficile PCR was negative. Because of reported uncontrolled blood pressure and persistent nausea and vomiting,internal medical assistance has been requested.  Assessment/Plan:  Abscess of gallbladder fossa s/p perc drainage 09/21/2016 post Acute cholecystitis s/p lap  cholecystectomy 09/08/2016 -Per primary surgical team -Blood cultures previous admission no growth -Culture from fluid obtained from percutaneous drain currently without growth -patient received 7 days of zosyn and 3 days of ciprofloxacin -patient reports pain is slightly better     Leukocytosis -trending down (21,000) on 11/4 -most likely in setting of acute kidney injury and marked dehydration -Patient w/o ongoing signs or source of infection currently  -recent CXR, abd x-ray and CT abdomen w/o acute abnormalities to explain findings -will recommend continue monitoring off antibiotics for now -continue IVF's    Nausea with vomiting/Abdominal pain/diarrhea -abd x-ray and CT suggesting ileus -Patient with increase diarrhea today (> 5 episodes in am only) 11/4 -C. difficile negative -will continue holding laxatives now -continue PRN pepcid and lomotil    Acute kidney injury  -Mild and likely related to volume depletion from ongoing poor oral intake associated with nausea vomiting and loose stools. -essentially back to baseline after IVF's -will continue IVF's for now    Chronic systolic heart failure  -Currently compensated -Was not on diuretics prior to admission -Continue carvedilol -will monitor I's and O's and daily weights     Hypertension -Moderately controlled in setting of ongoing abdominal pain and persistent nausea/vomiting -Patient BP more stable -will continue coreg and vasotec -PRN hydralazine also available     MITRAL VALVE REPLACEMENT, HX OF/Warfarin-induced coagulopathy  -Pharmacy managing warfarin -INR greater than 8 on 11/3 likely related to recent antibiotics and mild acute kidney injury -received Vit K for partial reversion; now INR 1.35  -no signs of acute bleeding -will resume coumadin and heparin for bridge therapy  -Goal is 2.5-3.5     CAD (coronary artery disease) -no CP or SOB -continue monitoring  -continue B-blocker        Anemia -Listed on previous discharge information Baseline hemoglobin 11.6  and current hemoglobin markedly elevated at 18.4 consistent with hemoconcentration and dehydration -will continue IVF's -patient UA also concentrated  -Hgb now 14.0    HYPERLIPIDEMIA, MIXED  -Not on medications prior to admission and currently not tolerating PO's well at this time -recommend assessment and further decision on treatment by PCP as an outpatient     Chronic back pain -Was on Vicodin 5-10 mg oral at home (q8h PRN) -Not tolerating well oral medications at this juncture -continue fentanyl IV and fentanyl patch  -hopefully will be able to be switched to PO meds soon     Pre-diabetes -continue SSI while inpatient -A1C 5.8 -will not need medications at discharge -advise to follow low carb diet  -close outpatient follow up  Code Status: Full Code Family Communication: daughter at bedside  Disposition Plan: remains inpatient; continue IVF's, continue observation off antibiotics for now. Post operative care, diet advance and further decision on drain as per surgical service (who is the primary service)  Procedures:  S/p laparoscopic cholecystectomy 09/08/16  IF percutaneous drain for presumed abscess in gallbladder fossa 09/21/16  Antibiotics:  None currently (but treated with 7 days of zosyn and 3 days of ciprofloxacin)  HPI/Subjective: Afebrile, still complaining of abd pain, but better. No CP, no SOB. deneis nausea and vomiting. > 5 BM's in am.   Objective: Vitals:   09/27/16 0535 09/27/16 0600  BP: (!) 151/102 (!) 165/75  Pulse: 95   Resp: 17   Temp: 98.7 F (37.1 C)     Intake/Output Summary (Last 24 hours) at 09/27/16 1104 Last data filed at 09/27/16 0410  Gross per 24 hour  Intake           898.33 ml  Output              310 ml  Net           588.33 ml   Filed Weights   09/25/16 1505 09/26/16 0500  Weight: 67.5 kg (148 lb 14.4 oz) 64.5 kg (142 lb 1.6 oz)     Exam:   General:  Afebrile, no CP, no SOB. Reports improvement in her abd pain. Appetite is off. Reports > 5 loose BM's this morning.   Cardiovascular: S1 and S2, no rubs, no gallops  Respiratory: no wheezing, no crackles, good air movement  Abdomen: soft, moderate tenderness across bilateral upper quadrants and epigastric area on palpation; positive BS. RUQ with percutaneous drain in place (collecting serosanguineous fluid)  Musculoskeletal: no edema, no cyanosis    Data Reviewed: Basic Metabolic Panel:  Recent Labs Lab 09/24/16 1201 09/25/16 1146 09/26/16 0227 09/26/16 0802 09/26/16 1427 09/26/16 1955  NA 134* 133*  --  133* 133* 136  K 4.0 3.7  --  3.1* 3.3* 3.5  CL 98* 90*  --  98* 99* 101  CO2 21* 22  --  24 24 24   GLUCOSE 164* 254*  --  128* 152* 160*  BUN 12 34*  --  19 15 12   CREATININE 1.17* 1.81*  --  0.77 0.71 0.72  CALCIUM 10.1 10.2  --  8.4* 8.3* 8.2*  MG  --   --  2.0  --   --   --    Liver Function Tests:  Recent Labs Lab 09/24/16 1201 09/25/16 1146 09/26/16 1427 09/26/16 1955  AST 38 53* 36 34  ALT 27 25 21 21   ALKPHOS 96 107 66 65  BILITOT 0.9 1.2 1.4* 1.0  PROT 8.3* 7.9 6.3* 6.5  ALBUMIN  3.9 3.7 2.6* 2.5*   CBC:  Recent Labs Lab 09/23/16 0601 09/24/16 0637 09/25/16 0853 09/26/16 0227 09/27/16 0325  WBC 13.3* 15.8* 25.3* 29.1* 21.0*  HGB 14.7 16.9* 18.4* 15.1* 14.1  HCT 44.7 49.1* 52.4* 43.6 40.8  MCV 99.1 95.2 94.4 93.2 93.8  PLT 276 335 304 247 220   CBG:  Recent Labs Lab 09/27/16 0026 09/27/16 0058 09/27/16 0139 09/27/16 0442 09/27/16 0805  GLUCAP 45* 51* 107* 152* 118*    Recent Results (from the past 240 hour(s))  C difficile quick scan w PCR reflex     Status: None   Collection Time: 09/17/16 12:49 PM  Result Value Ref Range Status   C Diff antigen NEGATIVE NEGATIVE Final   C Diff toxin NEGATIVE NEGATIVE Final   C Diff interpretation No C. difficile detected.  Final  Aerobic Culture (superficial specimen)      Status: None   Collection Time: 09/21/16  1:16 PM  Result Value Ref Range Status   Specimen Description ABSCESS ABDOMEN  Final   Special Requests INTRO ABDOMINAL ABSCESS  Final   Gram Stain   Final    RARE WBC PRESENT, PREDOMINANTLY PMN NO ORGANISMS SEEN    Culture NO GROWTH 2 DAYS  Final   Report Status 09/23/2016 FINAL  Final  Culture, Urine     Status: Abnormal   Collection Time: 09/24/16  9:45 PM  Result Value Ref Range Status   Specimen Description URINE, RANDOM  Final   Special Requests NONE  Final   Culture MULTIPLE SPECIES PRESENT, SUGGEST RECOLLECTION (A)  Final   Report Status 09/26/2016 FINAL  Final  Culture, blood (Routine X 2) w Reflex to ID Panel     Status: None (Preliminary result)   Collection Time: 09/25/16 11:40 AM  Result Value Ref Range Status   Specimen Description BLOOD RIGHT HAND  Final   Special Requests IN PEDIATRIC BOTTLE 3CC  Final   Culture NO GROWTH 1 DAY  Final   Report Status PENDING  Incomplete  Culture, blood (Routine X 2) w Reflex to ID Panel     Status: None (Preliminary result)   Collection Time: 09/25/16 11:50 AM  Result Value Ref Range Status   Specimen Description BLOOD LEFT HAND  Final   Special Requests IN PEDIATRIC BOTTLE 3CC  Final   Culture NO GROWTH 1 DAY  Final   Report Status PENDING  Incomplete     Studies: Ct Abdomen Pelvis Wo Contrast  Result Date: 09/25/2016 CLINICAL DATA:  Leukocytosis recent lap coli status post percutaneous drain for gallbladder fossa abscess increased abdominal pain EXAM: CT ABDOMEN AND PELVIS WITHOUT CONTRAST TECHNIQUE: Multidetector CT imaging of the abdomen and pelvis was performed following the standard protocol without IV contrast. COMPARISON:  Radiograph 09/25/2016, CT scan 09/21/2016 FINDINGS: Lower chest: Lung bases show no acute infiltrate or pleural effusion. Hepatobiliary: Focal hypodensity near the falciform ligament, may relate to focal fatty infiltration, noted on previous exams. There are  no new focal hepatic abnormalities. Patient is status post cholecystectomy. There is interim placement of a pigtail drain in the gallbladder fossa. The previously noted gas and fluid containing collection has markedly decreased. There are a few residual gas bubbles at the gallbladder fossa. Stable mildly prominent extrahepatic common bowel duct. Pancreas: Unremarkable. No pancreatic ductal dilatation or surrounding inflammatory changes. Spleen: Normal in size without focal abnormality. Adrenals/Urinary Tract: Adrenal glands are unremarkable. Kidneys are normal, without renal calculi, focal lesion, or hydronephrosis. Bladder is unremarkable. Stomach/Bowel: There is  thickening of the pylorus and proximal duodenum. There are contrast filled loops of bowel without high-grade obstruction. Fluid-filled colon could relate to mild ileus. Vascular/Lymphatic: Atherosclerotic vascular calcification. No significant adenopathy. Reproductive: Uterus and bilateral adnexa are unremarkable. Other: No free air or free fluid. Musculoskeletal: No acute osseous abnormality. Degenerative changes most marked at L5-S1. IMPRESSION: 1. Interim placement of a pigtail drain within the right upper quadrant. Significant decreased fluid and gas collection at the gallbladder fossa with small residual gas bubbles noted. 2. Focal hypodensity near the falciform ligament, may represent focal fatty infiltration 3. Mild thickening of the pylorus and proximal duodenum could relate to a mild gastroduodenitis. 4. Few prominent loops of contrast filled small bowel an fluid-filled colon, findings could relate to an ileus. No evidence for high-grade bowel obstruction. Electronically Signed   By: Jasmine PangKim  Fujinaga M.D.   On: 09/25/2016 20:36   Dg Abd 2 Views  Result Date: 09/25/2016 CLINICAL DATA:  Epigastric pain with nausea and vomiting. The patient has a catheter present on the right for intra-abdominal abscess drainage EXAM: ABDOMEN - 2 VIEW COMPARISON:   CT scan of September 21, 2016 FINDINGS: A few scattered air-fluid levels are noted in the right upper mid and lower abdomen on the upright view. There is no evidence of bowel obstruction. The stomach is not abnormally distended with air. No free extraluminal gas collections are observed. The right upper quadrant drainage catheter appears to be in reasonable position. The lung bases are clear. IMPRESSION: Mild ileuswhich is predominantly right-sided. No free extraluminal gas collections. Electronically Signed   By: David  SwazilandJordan M.D.   On: 09/25/2016 13:25    Scheduled Meds: . acetaminophen  1,000 mg Oral TID  . carvedilol  25 mg Oral BID WC  . dextrose  50 mL Intravenous Once  . enalapril  5 mg Oral BID  . famotidine (PEPCID) IV  20 mg Intravenous Q12H  . fentaNYL  25 mcg Transdermal Q72H  . insulin aspart  0-9 Units Subcutaneous Q4H  . lip balm   Topical BID  . ondansetron (ZOFRAN) IV  4 mg Intravenous Q6H  . saccharomyces boulardii  250 mg Oral BID  . sodium chloride flush  3 mL Intravenous Q12H  . warfarin  7.5 mg Oral ONCE-1800  . Warfarin - Pharmacist Dosing Inpatient   Does not apply q1800   Continuous Infusions: . dextrose 5 % and 0.9 % NaCl with KCl 20 mEq/L 100 mL/hr at 09/27/16 0041  . heparin      Principal Problem:   Abscess of gallbladder fossa s/p perc drainage 09/21/2016 Active Problems:   HYPERLIPIDEMIA, MIXED   Coronary atherosclerosis   Chronic systolic heart failure (HCC)   MITRAL VALVE REPLACEMENT, HX OF   Chronic back pain   Leukocytosis   Acute cholecystitis s/p lap cholecystectomy 09/08/2016   Nausea with vomiting   Anxiety state   CAD (coronary artery disease)   Abdominal pain   Warfarin-induced coagulopathy (HCC)   Acute kidney injury (HCC)   Anemia   Intra-abdominal infection   Subhepatic fluid collection    Time spent: 25 minutes    Vassie LollMadera, Daphane Odekirk  [facility] Pager [phone]. If 7PM-7AM, please contact night-coverage at [email or web address],  password [facility] Hospital[facility]1 09/27/2016, 11:04 AM  LOS: 12 days

## 2016-09-27 NOTE — Progress Notes (Signed)
ANTICOAGULATION CONSULT NOTE - Follow Up Consult  Pharmacy Consult for Warfarin with Heparin Bridge Indication: MVR  Assessment: [age] yoF on warfarin pta for mitral valve replacement. Warfarin was restarted on 10/30 with heparin bridge, then both were discontinued on 10/31 for supratherapeutic INRs. No known liver dysfunction (LFTs and Tbili wnl). Patient does have ongoing nausea and unable to eat. Was recently on cipro, which could account for elevated INR. Pharmacy has been consulted to restart warfarin with heparin bridge. Heparin was most recently therapeutic at 1050 units/hr. Today's INR is subtherapeutic at 1.35, which is expected after vitamin K 10mg  PO was given on 11/3. Hemoglobin is slightly low at 14.1, platelets wnl. No bleeding noted.   PTA Warfarin Regimen: 10mg  daily, except 7.5mg  Mon/Thurs  Goal of Therapy:  INR 2.5-3.5 Monitor platelets by anticoagulation protocol: Yes   Plan:  Start heparin infusion at 1050 units/hr (no bolus) Restart warfarin 7.5mg  PO x 1 dose tonight Check anti-Xa level in 6 hours and daily while on heparin  Monitor daily INR and HL, CBC as needed, and s/sx's of bleeding   Allergies  Allergen Reactions  . Cyclobenzaprine Hcl Nausea And Vomiting  . Latex Other (See Comments)    Unknown    Patient Measurements: Weight: 142 lb 1.6 oz (64.5 kg) Heparin Dosing Weight: 64.5 kg  Vital Signs: Temp: 98.7 F (37.1 C) (11/04 0535) Temp Source: Oral (11/04 0535) BP: 165/75 (11/04 0600) Pulse Rate: 95 (11/04 0535)  Labs:  Recent Labs  09/25/16 0853  09/26/16 0227 09/26/16 0802 09/26/16 1427 09/26/16 1955 09/27/16 0325  HGB 18.4*  --  15.1*  --   --   --  14.1  HCT 52.4*  --  43.6  --   --   --  40.8  PLT 304  --  247  --   --   --  220  LABPROT 57.5*  --  70.1*  --   --   --  16.8*  INR 6.29*  --  8.06*  --   --   --  1.35  CREATININE  --   < >  --  0.77 0.71 0.72  --   < > = values in this interval not displayed.  Estimated Creatinine  Clearance: 69.4 mL/min (by C-G formula based on SCr of 0.72 mg/dL).   Medications:  Scheduled:  . acetaminophen  1,000 mg Oral TID  . carvedilol  25 mg Oral BID WC  . dextrose  50 mL Intravenous Once  . enalapril  5 mg Oral BID  . famotidine (PEPCID) IV  20 mg Intravenous Q12H  . fentaNYL  25 mcg Transdermal Q72H  . insulin aspart  0-9 Units Subcutaneous Q4H  . lip balm   Topical BID  . ondansetron (ZOFRAN) IV  4 mg Intravenous Q6H  . saccharomyces boulardii  250 mg Oral BID  . sodium chloride flush  3 mL Intravenous Q12H  . warfarin  7.5 mg Oral ONCE-1800  . Warfarin - Pharmacist Dosing Inpatient   Does not apply q1800   Infusions:  . dextrose 5 % and 0.9 % NaCl with KCl 20 mEq/L 100 mL/hr at 09/27/16 0041  . heparin      Allie BossierApryl Nahuel Wilbert, PharmD PGY1 Pharmacy Resident [phone] (Pager) 09/27/2016 10:31 AM

## 2016-09-27 NOTE — Progress Notes (Signed)
ANTICOAGULATION CONSULT NOTE - Follow Up Consult  Pharmacy Consult for Heparin Indication: MVR  Allergies  Allergen Reactions  . Cyclobenzaprine Hcl Nausea And Vomiting  . Latex Other (See Comments)    Unknown    Patient Measurements: Weight: 142 lb 1.6 oz (64.5 kg)   Vital Signs: Temp: 97.1 F (36.2 C) (11/04 1500) Temp Source: Axillary (11/04 1500) BP: 145/88 (11/04 1500) Pulse Rate: 80 (11/04 1500)  Labs:  Recent Labs  09/25/16 0853  09/26/16 0227 09/26/16 0802 09/26/16 1427 09/26/16 1955 09/27/16 0325 09/27/16 1833  HGB 18.4*  --  15.1*  --   --   --  14.1  --   HCT 52.4*  --  43.6  --   --   --  40.8  --   PLT 304  --  247  --   --   --  220  --   LABPROT 57.5*  --  70.1*  --   --   --  16.8*  --   INR 6.29*  --  8.06*  --   --   --  1.35  --   HEPARINUNFRC  --   --   --   --   --   --   --  0.30  CREATININE  --   < >  --  0.77 0.71 0.72  --   --   < > = values in this interval not displayed.  Estimated Creatinine Clearance: 69.4 mL/min (by C-G formula based on SCr of 0.72 mg/dL).   Medications:  Heparin @ 1050 units/hr  Assessment: 55yof restarted on coumadin with heparin bridge earlier today for her MVR. Initial heparin level is therapeutic on the low end at 0.30. No bleeding.  Goal of Therapy:  Heparin level 0.3-0.7 units/ml Monitor platelets by anticoagulation protocol: Yes   Plan:  1) Increase heparin slightly to 1100 units/hr 2) Daily heparin level and CBC  Fredrik RiggerMarkle, Keilly Fatula Sue 09/27/2016,7:25 PM

## 2016-09-27 NOTE — Progress Notes (Signed)
Subjective: Still sore. But fentanyl has helped. Not much appetite. No n/v. Walked 4x yesterday.   Objective: Vital signs in last 24 hours: Temp:  [97.6 F (36.4 C)-99 F (37.2 C)] 98.7 F (37.1 C) (11/04 0535) Pulse Rate:  [79-95] 95 (11/04 0535) Resp:  [16-17] 17 (11/04 0535) BP: (135-173)/(62-102) 165/75 (11/04 0600) SpO2:  [98 %-100 %] 99 % (11/04 0535) Last BM Date: 09/25/16  Intake/Output from previous day: 11/03 0701 - 11/04 0700 In: 1021.3 [P.O.:360; I.V.:606.3; IV Piggyback:50] Out: 310 [Urine:300; Drains:10] Intake/Output this shift: No intake/output data recorded.  Alert, nad, not ill appearing cta  Reg Soft, nd, min TTP; drain - serous No edema  Lab Results:   Recent Labs  09/26/16 0227 09/27/16 0325  WBC 29.1* 21.0*  HGB 15.1* 14.1  HCT 43.6 40.8  PLT 247 220   BMET  Recent Labs  09/26/16 1427 09/26/16 1955  NA 133* 136  K 3.3* 3.5  CL 99* 101  CO2 24 24  GLUCOSE 152* 160*  BUN 15 12  CREATININE 0.71 0.72  CALCIUM 8.3* 8.2*   PT/INR  Recent Labs  09/26/16 0227 09/27/16 0325  LABPROT 70.1* 16.8*  INR 8.06* 1.35   ABG No results for input(s): PHART, HCO3 in the last 72 hours.  Invalid input(s): PCO2, PO2  Studies/Results: Ct Abdomen Pelvis Wo Contrast  Result Date: 09/25/2016 CLINICAL DATA:  Leukocytosis recent lap coli status post percutaneous drain for gallbladder fossa abscess increased abdominal pain EXAM: CT ABDOMEN AND PELVIS WITHOUT CONTRAST TECHNIQUE: Multidetector CT imaging of the abdomen and pelvis was performed following the standard protocol without IV contrast. COMPARISON:  Radiograph 09/25/2016, CT scan 09/21/2016 FINDINGS: Lower chest: Lung bases show no acute infiltrate or pleural effusion. Hepatobiliary: Focal hypodensity near the falciform ligament, may relate to focal fatty infiltration, noted on previous exams. There are no new focal hepatic abnormalities. Patient is status post cholecystectomy. There is  interim placement of a pigtail drain in the gallbladder fossa. The previously noted gas and fluid containing collection has markedly decreased. There are a few residual gas bubbles at the gallbladder fossa. Stable mildly prominent extrahepatic common bowel duct. Pancreas: Unremarkable. No pancreatic ductal dilatation or surrounding inflammatory changes. Spleen: Normal in size without focal abnormality. Adrenals/Urinary Tract: Adrenal glands are unremarkable. Kidneys are normal, without renal calculi, focal lesion, or hydronephrosis. Bladder is unremarkable. Stomach/Bowel: There is thickening of the pylorus and proximal duodenum. There are contrast filled loops of bowel without high-grade obstruction. Fluid-filled colon could relate to mild ileus. Vascular/Lymphatic: Atherosclerotic vascular calcification. No significant adenopathy. Reproductive: Uterus and bilateral adnexa are unremarkable. Other: No free air or free fluid. Musculoskeletal: No acute osseous abnormality. Degenerative changes most marked at L5-S1. IMPRESSION: 1. Interim placement of a pigtail drain within the right upper quadrant. Significant decreased fluid and gas collection at the gallbladder fossa with small residual gas bubbles noted. 2. Focal hypodensity near the falciform ligament, may represent focal fatty infiltration 3. Mild thickening of the pylorus and proximal duodenum could relate to a mild gastroduodenitis. 4. Few prominent loops of contrast filled small bowel an fluid-filled colon, findings could relate to an ileus. No evidence for high-grade bowel obstruction. Electronically Signed   By: Jasmine PangKim  Fujinaga M.D.   On: 09/25/2016 20:36   Dg Abd 2 Views  Result Date: 09/25/2016 CLINICAL DATA:  Epigastric pain with nausea and vomiting. The patient has a catheter present on the right for intra-abdominal abscess drainage EXAM: ABDOMEN - 2 VIEW COMPARISON:  CT scan of September 21, 2016 FINDINGS: A few scattered air-fluid levels are noted in  the right upper mid and lower abdomen on the upright view. There is no evidence of bowel obstruction. The stomach is not abnormally distended with air. No free extraluminal gas collections are observed. The right upper quadrant drainage catheter appears to be in reasonable position. The lung bases are clear. IMPRESSION: Mild ileuswhich is predominantly right-sided. No free extraluminal gas collections. Electronically Signed   By: David  SwazilandJordan M.D.   On: 09/25/2016 13:25    Anti-infectives: Anti-infectives    Start     Dose/Rate Route Frequency Ordered Stop   09/15/16 2230  ciprofloxacin (CIPRO) IVPB 400 mg  Status:  Discontinued     400 mg 200 mL/hr over 60 Minutes Intravenous Every 12 hours 09/15/16 2215 09/17/16 1249      Assessment/Plan: Abdominal pain, Nausea, probably secondary to some subhepatic fluid collection S/p laparoscopic cholecystectomy 09/08/16, Dr. Ezzard StandingNewman IR placement of drain 09/21/16 - no growth on fluid cultured [name] and left main stent on coumadin - INR  8.06- Heparin protocol CVA 09/2012 Hx of chronic pain on Oxycodone for 2 years not previously reported New leukocytosis Supra-theraputic INR  Had gotten up to 8.06 - Discussed with pharmacy, now normalized Dehydration - continuing IV hydration, resolved Acute renal insuffiencey - Cr normal FEN: adv to fulls. Dec mIVF. Add nutrition shakes. ID: she had 7 days of Zosyn 10/13-10/19/17 ciprofloxacin day 3 09/17/16, then discontinued. C diff negative 09/17/16 - No growth on Blood or fluid cultures no growth on GB fossa fluid. Appreciated ID input. No fever. Wbc down to 21.  VTE: heparin per pharmacy, can start oral anticoag.    Plan: appreciate [facility]ad and ID assist. Home in next 48hrs if remain afebrile and wbc trend down  Mary SellaEric M. Andrey CampanileWilson, MD, FACS General, Bariatric, & Minimally Invasive Surgery [facility], [place]   LOS: 12 days    Atilano InaWILSON,Laiklyn Pilkenton M 09/27/2016

## 2016-09-27 NOTE — Progress Notes (Deleted)
ANTICOAGULATION CONSULT NOTE - Follow Up Consult  Pharmacy Consult for Heparin Indication: MVR  Assessment: 55 yoF on warfarin pta for mitral valve replacement. Warfarin was restarted on 10/30 with heparin bridge, then both were discontinued on 10/31 for supratherapeutic INRs. No known liver dysfunction (LFTs and Tbili wnl). Patient does have ongoing nausea and unable to eat. Was recently on cipro, which could account for elevated INR. Pharmacy has been consulted to restart warfarin with heparin bridge.    PTA Warfarin Regimen: 10mg  daily, except 7.5mg  Mon/Thurs  Goal of Therapy:  INR 2.5-3.5 Monitor platelets by anticoagulation protocol: Yes Heparin level = 0.3-0.7   Plan:  Increase heparin to 1100 units / hr Follow up AM labs  Allergies  Allergen Reactions  . Cyclobenzaprine Hcl Nausea And Vomiting  . Latex Other (See Comments)    Unknown    Patient Measurements: Weight: 142 lb 1.6 oz (64.5 kg) Heparin Dosing Weight: 64.5 kg  Vital Signs: Temp: 97.1 F (36.2 C) (11/04 1500) Temp Source: Axillary (11/04 1500) BP: 145/88 (11/04 1500) Pulse Rate: 80 (11/04 1500)  Labs:  Recent Labs  09/25/16 0853  09/26/16 0227 09/26/16 0802 09/26/16 1427 09/26/16 1955 09/27/16 0325 09/27/16 1833  HGB 18.4*  --  15.1*  --   --   --  14.1  --   HCT 52.4*  --  43.6  --   --   --  40.8  --   PLT 304  --  247  --   --   --  220  --   LABPROT 57.5*  --  70.1*  --   --   --  16.8*  --   INR 6.29*  --  8.06*  --   --   --  1.35  --   HEPARINUNFRC  --   --   --   --   --   --   --  0.30  CREATININE  --   < >  --  0.77 0.71 0.72  --   --   < > = values in this interval not displayed.  Estimated Creatinine Clearance: 69.4 mL/min (by C-G formula based on SCr of 0.72 mg/dL).   Medications:  Scheduled:  . acetaminophen  1,000 mg Oral TID  . carvedilol  25 mg Oral BID WC  . dextrose  50 mL Intravenous Once  . enalapril  5 mg Oral BID  . famotidine (PEPCID) IV  20 mg Intravenous  Q12H  . feeding supplement  1 Container Oral TID BM  . fentaNYL  25 mcg Transdermal Q72H  . insulin aspart  0-9 Units Subcutaneous Q4H  . lip balm   Topical BID  . ondansetron (ZOFRAN) IV  4 mg Intravenous Q6H  . saccharomyces boulardii  250 mg Oral BID  . sodium chloride flush  3 mL Intravenous Q12H  . warfarin  7.5 mg Oral ONCE-1800  . Warfarin - Pharmacist Dosing Inpatient   Does not apply q1800   Infusions:  . dextrose 5 % and 0.9 % NaCl with KCl 20 mEq/L 50 mL/hr at 09/27/16 1400  . heparin     Thank you Okey RegalLisa Talyssa Gibas, PharmD [phone] 09/27/2016 7:28 PM

## 2016-09-28 DIAGNOSIS — D72829 Elevated white blood cell count, unspecified: Secondary | ICD-10-CM

## 2016-09-28 DIAGNOSIS — R109 Unspecified abdominal pain: Secondary | ICD-10-CM

## 2016-09-28 DIAGNOSIS — R188 Other ascites: Secondary | ICD-10-CM

## 2016-09-28 DIAGNOSIS — R509 Fever, unspecified: Secondary | ICD-10-CM

## 2016-09-28 DIAGNOSIS — B999 Unspecified infectious disease: Secondary | ICD-10-CM

## 2016-09-28 DIAGNOSIS — Z952 Presence of prosthetic heart valve: Secondary | ICD-10-CM

## 2016-09-28 LAB — GLUCOSE, CAPILLARY
GLUCOSE-CAPILLARY: 110 mg/dL — AB (ref 65–99)
GLUCOSE-CAPILLARY: 118 mg/dL — AB (ref 65–99)
GLUCOSE-CAPILLARY: 123 mg/dL — AB (ref 65–99)
GLUCOSE-CAPILLARY: 126 mg/dL — AB (ref 65–99)
Glucose-Capillary: 123 mg/dL — ABNORMAL HIGH (ref 65–99)
Glucose-Capillary: 124 mg/dL — ABNORMAL HIGH (ref 65–99)
Glucose-Capillary: 126 mg/dL — ABNORMAL HIGH (ref 65–99)

## 2016-09-28 LAB — BASIC METABOLIC PANEL
ANION GAP: 7 (ref 5–15)
BUN: 6 mg/dL (ref 6–20)
CALCIUM: 7.6 mg/dL — AB (ref 8.9–10.3)
CO2: 22 mmol/L (ref 22–32)
CREATININE: 0.69 mg/dL (ref 0.44–1.00)
Chloride: 107 mmol/L (ref 101–111)
GFR calc Af Amer: >60 mL/min (ref >60)
GLUCOSE: 118 mg/dL — AB (ref 65–99)
Potassium: 3.2 mmol/L — ABNORMAL LOW (ref 3.5–5.1)
Sodium: 136 mmol/L (ref 135–145)

## 2016-09-28 LAB — CBC
HCT: 38.3 % (ref 36.0–46.0)
HEMOGLOBIN: 12.7 g/dL (ref 12.0–15.0)
MCH: 32.2 pg (ref 26.0–34.0)
MCHC: 33.2 g/dL (ref 30.0–36.0)
MCV: 97.2 fL (ref 78.0–100.0)
Platelets: 195 10*3/uL (ref 150–400)
RBC: 3.94 MIL/uL (ref 3.87–5.11)
RDW: 13.9 % (ref 11.5–15.5)
WBC: 13 10*3/uL — ABNORMAL HIGH (ref 4.0–10.5)

## 2016-09-28 LAB — HEPARIN LEVEL (UNFRACTIONATED)
HEPARIN UNFRACTIONATED: 0.17 [IU]/mL — AB (ref 0.30–0.70)
Heparin Unfractionated: 0.54 IU/mL (ref 0.30–0.70)

## 2016-09-28 LAB — PROTIME-INR
INR: 1.12
PROTHROMBIN TIME: 14.4 s (ref 11.4–15.2)

## 2016-09-28 MED ORDER — HEPARIN (PORCINE) IN NACL 100-0.45 UNIT/ML-% IJ SOLN
1100.0000 [IU]/h | INTRAMUSCULAR | Status: DC
  Administered 2016-09-28 – 2016-09-30 (×4): 1300 [IU]/h via INTRAVENOUS
  Administered 2016-10-01 – 2016-10-03 (×3): 1100 [IU]/h via INTRAVENOUS
  Filled 2016-09-28 (×9): qty 250

## 2016-09-28 MED ORDER — FAMOTIDINE 20 MG PO TABS
20.0000 mg | ORAL_TABLET | Freq: Two times a day (BID) | ORAL | Status: DC
  Administered 2016-09-28 – 2016-09-30 (×5): 20 mg via ORAL
  Filled 2016-09-28 (×5): qty 1

## 2016-09-28 MED ORDER — WARFARIN SODIUM 7.5 MG PO TABS
7.5000 mg | ORAL_TABLET | Freq: Once | ORAL | Status: AC
  Administered 2016-09-28: 7.5 mg via ORAL
  Filled 2016-09-28: qty 1

## 2016-09-28 MED ORDER — POTASSIUM CHLORIDE CRYS ER 20 MEQ PO TBCR
40.0000 meq | EXTENDED_RELEASE_TABLET | Freq: Once | ORAL | Status: AC
  Administered 2016-09-28: 40 meq via ORAL
  Filled 2016-09-28: qty 2

## 2016-09-28 NOTE — Progress Notes (Signed)
ANTICOAGULATION CONSULT NOTE - Follow Up Consult  Pharmacy Consult:  Heparin / Coumadin Indication: MVR  Allergies  Allergen Reactions  . Cyclobenzaprine Hcl Nausea And Vomiting  . Latex Other (See Comments)    Unknown   Patient Measurements: Heparin Dosing Weight: 64kg  Vital Signs: Temp: 98.6 F (37 C) (11/05 0450) Temp Source: Oral (11/05 0450) BP: 128/61 (11/05 0450) Pulse Rate: 77 (11/05 0450)  Labs:  Recent Labs  09/26/16 0227  09/26/16 1427 09/26/16 1955 09/27/16 0325 09/27/16 1833 09/28/16 0632  HGB 15.1*  --   --   --  14.1  --  12.7  HCT 43.6  --   --   --  40.8  --  38.3  PLT 247  --   --   --  220  --  195  LABPROT 70.1*  --   --   --  16.8*  --  14.4  INR 8.06*  --   --   --  1.35  --  1.12  HEPARINUNFRC  --   --   --   --   --  0.30 0.17*  CREATININE  --   < > 0.71 0.72  --   --  0.69  < > = values in this interval not displayed.  Estimated Creatinine Clearance: 69.4 mL/min (by C-G formula based on SCr of 0.69 mg/dL).   Assessment: 55 YOF on Coumadin 10mg  daily except for 7.5mg  on Mon/Thurs PTA for MVR (goal 2.5 - 3.5).  Heparin and Coumadin have been off and on due to fluctuating INRs requiring Vitamin K administration.  Currently on IV heparin and Coumadin.  Heparin level is sub-therapeutic today; no interruption nor complication with infusion per RN.  Coumadin dose was not charted as given last night and INR remains sub-therapeutic.  No bleeding reported.   Goal of Therapy:  INR 2.5 - 3.5 HL 0.3 - 0.7 units/mL Monitor platelets by anticoagulation protocol: Yes    Plan: - Increase heparin gtt to 1300 units/hr - Check 6 hr heparin level - Coumadin 7.5mg  PO today - Daily heparin level, CBC, INR - F/U KCL supplementation   Paulmichael Schreck D. Laney Potashang, PharmD, BCPS Pager:  [phone] 09/28/2016, 9:57 AM

## 2016-09-28 NOTE — Progress Notes (Signed)
  Subjective: No complaints. Some loose stool. No n/v. Tolerated liquids. Walked. No fever.   Objective: Vital signs in last 24 hours: Temp:  [97.1 F (36.2 C)-99.1 F (37.3 C)] 98.6 F (37 C) (11/05 0450) Pulse Rate:  [77-80] 77 (11/05 0450) Resp:  [18] 18 (11/05 0450) BP: (128-153)/(56-88) 128/61 (11/05 0450) SpO2:  [97 %-99 %] 97 % (11/05 0450) Weight:  [64 kg (141 lb)] 64 kg (141 lb) (11/05 0450) Last BM Date: 09/28/16  Intake/Output from previous day: 11/04 0701 - 11/05 0700 In: 410 [P.O.:410] Out: -  Intake/Output this shift: No intake/output data recorded.  Alert, nad cta Soft, mild ttp; drain -serosang, nd No edema   Lab Results:   Recent Labs  09/27/16 0325 09/28/16 0632  WBC 21.0* 13.0*  HGB 14.1 12.7  HCT 40.8 38.3  PLT 220 195   BMET  Recent Labs  09/26/16 1955 09/28/16 0632  NA 136 136  K 3.5 3.2*  CL 101 107  CO2 24 22  GLUCOSE 160* 118*  BUN 12 6  CREATININE 0.72 0.69  CALCIUM 8.2* 7.6*   PT/INR  Recent Labs  09/27/16 0325 09/28/16 0632  LABPROT 16.8* 14.4  INR 1.35 1.12   ABG No results for input(s): PHART, HCO3 in the last 72 hours.  Invalid input(s): PCO2, PO2  Studies/Results: No results found.  Anti-infectives: Anti-infectives    Start     Dose/Rate Route Frequency Ordered Stop   09/15/16 2230  ciprofloxacin (CIPRO) IVPB 400 mg  Status:  Discontinued     400 mg 200 mL/hr over 60 Minutes Intravenous Every 12 hours 09/15/16 2215 09/17/16 1249      Assessment/Plan: Abdominal pain, Nausea, probably secondary to some subhepatic fluid collection S/p laparoscopic cholecystectomy10/16/17, Dr. Ezzard StandingNewman IR placement of drain 09/21/16- no growth on fluid cultured [name] and left main stent on coumadin - INR 8.06- Heparin protocol CVA 09/2012 Hx of chronic pain on Oxycodone for 2 years not previously reported New leukocytosis - trending down Supra-theraputic INR  Had gottenup to 8.06 - Discussed  with pharmacy, now normalized Dehydration -continuing IV hydration, resolved, DC IVF Acute renal insuffiencey- Cr normal FEN:adv to regular. D/c mIVF. Add nutrition shakes. Hypokalemia - replace potassium today ID: she had 7 days of Zosyn 10/13-10/19/17 ciprofloxacin day 3 09/17/16, then discontinued. C diff negative 09/17/16 - No growth on Blood or fluid cultures no growth on GB fossa fluid. Appreciated ID input. No fever. Wbc decreasing 21 to 13 ZOX:[id number]:heparin per pharmacy, started coumadin. not therapeutic.   Plan: appreciate [facility]ad and ID assist. Home once INR therapeutic. Will pull perc drain Monday since no growth, no output.   Mary SellaEric M. Andrey CampanileWilson, MD, FACS General, Bariatric, & Minimally Invasive Surgery [facility] [place]   LOS: 13 days    Atilano InaWILSON,Kenry Daubert M 09/28/2016

## 2016-09-28 NOTE — Progress Notes (Signed)
[facility] PROGRESS NOTE  [name]:[id number] DOB: September 19, 1961 DOA: 09/15/2016 PCP: Ignatius Specking, MD  Interim summary and HPI 54 y.o. 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 She was recently discharged on 10/20 for undergoing laparoscopic cholecystectomy for acute cholecystitis. During that hospitalization her chronic medical problems remain stable. She was readmitted back to the hospital on 10/23 for complaints of abdominal pain with associated nausea vomiting. She was found to have a fluid collection consistent with likely abscess in the gallbladder fossa and underwent CT-guided percutaneous drain placement on 10/29. She is on chronic warfarin and required discontinuation of her warfarin and supplementation with IV heparin before procedure could be performed. During previous hospitalization patient received Zosyn for 7 days. During that hospitalization she received Cipro for 3 days. There was no growth on any blood or fluid culture so antibiotics were discontinued. Unfortunately she has had issues with persistent diarrhea with nausea and vomiting and persistent central abdominal pain without reflux. Urinalysis appeared unremarkable but this was obtained while patient was on antibiotics. She has been unable to eat any solids although has been able to keep some clear liquids down. Her blood pressure has been reported as difficult to control. C. difficile PCR was negative. Because of reported uncontrolled blood pressure and persistent nausea and vomiting,internal medical assistance has been requested.  Assessment/Plan:  Abscess of gallbladder fossa s/p perc drainage 09/21/2016 post Acute cholecystitis s/p lap  cholecystectomy 09/08/2016 -Per primary surgical team -Blood cultures previous admission no growth -Culture from fluid obtained from percutaneous drain currently without growth -patient received 7 days of zosyn and 3 days of ciprofloxacin -patient reports pain is slightly better     Leukocytosis -trending down (13,000) on 11/5 -most likely in setting of acute kidney injury and marked dehydration -Patient w/o ongoing signs or source of infection currently  -recent CXR, abd x-ray and CT abdomen w/o acute abnormalities to explain findings -will recommend continue monitoring off antibiotics for now -continue IVF's    Nausea with vomiting/Abdominal pain/diarrhea -abd x-ray and CT suggesting ileus -Patient with increase diarrhea today (> 5 episodes in am only) 11/4 -C. difficile negative -will continue holding laxatives now -continue PRN pepcid and lomotil    Acute kidney injury  -Mild and likely related to volume depletion from ongoing poor oral intake associated with nausea vomiting and loose stools. -back to baseline and stable -will continue IVF's for one more day    Chronic systolic heart failure  -Currently compensated -Was not on diuretics prior to admission -Continue carvedilol -will monitor I's and O's and daily weights  -consider reducing IVF's after today, to prevent fluid overload (will recommend 50-75cc/HR base on her PO intake)    Hypertension -Moderately controlled in setting of ongoing abdominal pain and persistent nausea/vomiting -Patient BP stable now -will recommend continue coreg and vasotec -PRN hydralazine also available     MITRAL VALVE REPLACEMENT, HX OF/Warfarin-induced coagulopathy  -Pharmacy managing warfarin -INR greater than 8 on 11/3 likely related to recent antibiotics and mild acute kidney injury -received Vit K for partial reversion; now INR 1.12  -no signs of acute bleeding -Pharmacy to dose; coumadin and heparin for bridge therapy  resumed  -Goal is 2.5-3.5     CAD (coronary artery disease) -no CP or  SOB -continue monitoring  -continue B-blocker       Anemia -stable and w/o signs of acute bleeding currently  -Hgb now 14.0    HYPERLIPIDEMIA, MIXED  -Not on medications prior to admission and currently not tolerating PO's well at this time -recommend assessment and further decision on treatment by PCP as an outpatient     Chronic back pain -Was on Vicodin 5-10 mg oral at home (q8h PRN) -Not tolerating well oral medications at this juncture -continue fentanyl IV and fentanyl patch  -hopefully will be able to be switched to PO meds soon     Pre-diabetes -continue SSI while inpatient -A1C 5.8 -will not need medications at discharge -advise to follow low carb diet  -close outpatient follow up  Code Status: Full Code Family Communication: daughter at bedside  Disposition Plan: remains inpatient; continue IVF's, continue observation off antibiotics for now. Post operative care, diet advance and further decision on drain as per surgical service (who is the primary service). IM will sign off for now. Call with any questions   Procedures:  S/p laparoscopic cholecystectomy 09/08/16  IF percutaneous drain for presumed abscess in gallbladder fossa 09/21/16  Antibiotics:  None currently (but treated with 7 days of zosyn and 3 days of ciprofloxacin)  HPI/Subjective: Afebrile, still complaining of abd pain intermittently, but better overall. No CP, no SOB. denies nausea and vomiting. reports decrease BM amount, even still loose.  Objective: Vitals:   09/28/16 0450 09/28/16 1339  BP: 128/61 (!) 164/59  Pulse: 77 (!) 58  Resp: 18 16  Temp: 98.6 F (37 C) 98.8 F (37.1 C)    Intake/Output Summary (Last 24 hours) at 09/28/16 1915 Last data filed at 09/28/16 1300  Gross per 24 hour  Intake              480 ml  Output                0 ml  Net              480 ml   Filed Weights   09/25/16 1505  09/26/16 0500 09/28/16 0450  Weight: 67.5 kg (148 lb 14.4 oz) 64.5 kg (142 lb 1.6 oz) 64 kg (141 lb)    Exam:   General:  Afebrile, no CP, no SOB. Reports still having abd pain, but improved. Appetite is better today. Reports having loose BM's but less.  Cardiovascular: S1 and S2, no rubs, no gallops  Respiratory: no wheezing, no crackles, good air movement  Abdomen: soft, moderate tenderness across bilateral upper quadrants and epigastric area on palpation; positive BS. RUQ with percutaneous drain in place (collecting serosanguineous fluid)  Musculoskeletal: no edema, no cyanosis    Data Reviewed: Basic Metabolic Panel:  Recent Labs Lab 09/25/16 1146 09/26/16 0227 09/26/16 0802 09/26/16 1427 09/26/16 1955 09/28/16 0632  NA 133*  --  133* 133* 136 136  K 3.7  --  3.1* 3.3* 3.5 3.2*  CL 90*  --  98* 99* 101 107  CO2 22  --  24 24 24 22   GLUCOSE 254*  --  128* 152* 160* 118*  BUN 34*  --  19 15 12 6   CREATININE 1.81*  --  0.77 0.71 0.72 0.69  CALCIUM 10.2  --  8.4* 8.3* 8.2* 7.6*  MG  --  2.0  --   --   --   --    Liver Function Tests:  Recent Labs Lab 09/24/16 1201 09/25/16 1146 09/26/16 1427 09/26/16  1955  AST 38 53* 36 34  ALT 27 25 21 21   ALKPHOS 96 107 66 65  BILITOT 0.9 1.2 1.4* 1.0  PROT 8.3* 7.9 6.3* 6.5  ALBUMIN 3.9 3.7 2.6* 2.5*   CBC:  Recent Labs Lab 09/24/16 0637 09/25/16 0853 09/26/16 0227 09/27/16 0325 09/28/16 0632  WBC 15.8* 25.3* 29.1* 21.0* 13.0*  HGB 16.9* 18.4* 15.1* 14.1 12.7  HCT 49.1* 52.4* 43.6 40.8 38.3  MCV 95.2 94.4 93.2 93.8 97.2  PLT 335 304 247 220 195   CBG:  Recent Labs Lab 09/28/16 0004 09/28/16 0456 09/28/16 0741 09/28/16 1149 09/28/16 1623  GLUCAP 118* 123* 124* 126* 123*    Recent Results (from the past 240 hour(s))  Aerobic Culture (superficial specimen)     Status: None   Collection Time: 09/21/16  1:16 PM  Result Value Ref Range Status   Specimen Description ABSCESS ABDOMEN  Final   Special  Requests INTRO ABDOMINAL ABSCESS  Final   Gram Stain   Final    RARE WBC PRESENT, PREDOMINANTLY PMN NO ORGANISMS SEEN    Culture NO GROWTH 2 DAYS  Final   Report Status 09/23/2016 FINAL  Final  Culture, Urine     Status: Abnormal   Collection Time: 09/24/16  9:45 PM  Result Value Ref Range Status   Specimen Description URINE, RANDOM  Final   Special Requests NONE  Final   Culture MULTIPLE SPECIES PRESENT, SUGGEST RECOLLECTION (A)  Final   Report Status 09/26/2016 FINAL  Final  Culture, blood (Routine X 2) w Reflex to ID Panel     Status: None (Preliminary result)   Collection Time: 09/25/16 11:40 AM  Result Value Ref Range Status   Specimen Description BLOOD RIGHT HAND  Final   Special Requests IN PEDIATRIC BOTTLE 3CC  Final   Culture NO GROWTH 3 DAYS  Final   Report Status PENDING  Incomplete  Culture, blood (Routine X 2) w Reflex to ID Panel     Status: None (Preliminary result)   Collection Time: 09/25/16 11:50 AM  Result Value Ref Range Status   Specimen Description BLOOD LEFT HAND  Final   Special Requests IN PEDIATRIC BOTTLE 3CC  Final   Culture NO GROWTH 3 DAYS  Final   Report Status PENDING  Incomplete  Culture, Urine     Status: Abnormal   Collection Time: 09/25/16  5:32 PM  Result Value Ref Range Status   Specimen Description URINE, CLEAN CATCH  Final   Special Requests NONE  Final   Culture MULTIPLE SPECIES PRESENT, SUGGEST RECOLLECTION (A)  Final   Report Status 09/27/2016 FINAL  Final     Studies: No results found.  Scheduled Meds: . acetaminophen  1,000 mg Oral TID  . carvedilol  25 mg Oral BID WC  . dextrose  50 mL Intravenous Once  . enalapril  5 mg Oral BID  . famotidine  20 mg Oral BID  . feeding supplement  1 Container Oral TID BM  . fentaNYL  25 mcg Transdermal Q72H  . insulin aspart  0-9 Units Subcutaneous Q4H  . lip balm   Topical BID  . ondansetron (ZOFRAN) IV  4 mg Intravenous Q6H  . saccharomyces boulardii  250 mg Oral BID  . sodium  chloride flush  3 mL Intravenous Q12H  . Warfarin - Pharmacist Dosing Inpatient   Does not apply q1800   Continuous Infusions: . heparin 1,300 Units/hr (09/28/16 1029)    Principal Problem:   Intra-abdominal fluid collection  s/p perc drain 10/29 Active Problems:   HYPERLIPIDEMIA, MIXED   Coronary atherosclerosis   Chronic systolic heart failure (HCC)   MITRAL VALVE REPLACEMENT, HX OF   Chronic back pain   Leukocytosis   Acute cholecystitis s/p lap cholecystectomy 09/08/2016   Nausea with vomiting   Anxiety state   CAD (coronary artery disease)   Abdominal pain   Warfarin-induced coagulopathy (HCC)   Acute kidney injury (HCC)   Anemia   Intra-abdominal infection   Subhepatic fluid collection    Time spent: 15 minutes    Vassie LollMadera, Clydean Posas  [facility] Pager [phone]. If 7PM-7AM, please contact night-coverage at [email or web address], password [email or web address] 09/28/2016, 7:15 PM  LOS: 13 days

## 2016-09-28 NOTE — Progress Notes (Signed)
ANTICOAGULATION CONSULT NOTE - Follow Up Consult  Pharmacy Consult for Heparin Indication: MVR  Allergies  Allergen Reactions  . Cyclobenzaprine Hcl Nausea And Vomiting  . Latex Other (See Comments)    Unknown    Patient Measurements: Weight: 141 lb (64 kg)   Vital Signs: Temp: 98.8 F (37.1 C) (11/05 1339) BP: 164/59 (11/05 1339) Pulse Rate: 58 (11/05 1339)  Labs:  Recent Labs  09/26/16 0227  09/26/16 1427 09/26/16 1955 09/27/16 0325 09/27/16 1833 09/28/16 0632 09/28/16 1647  HGB 15.1*  --   --   --  14.1  --  12.7  --   HCT 43.6  --   --   --  40.8  --  38.3  --   PLT 247  --   --   --  220  --  195  --   LABPROT 70.1*  --   --   --  16.8*  --  14.4  --   INR 8.06*  --   --   --  1.35  --  1.12  --   HEPARINUNFRC  --   --   --   --   --  0.30 0.17* 0.54  CREATININE  --   < > 0.71 0.72  --   --  0.69  --   < > = values in this interval not displayed.  Estimated Creatinine Clearance: 69.4 mL/min (by C-G formula based on SCr of 0.69 mg/dL).   Medications:  Heparin @ 1300 units/hr  Assessment: 55yof restarted on coumadin with heparin bridge earlier today for her MVR. Heparin level is now therapeutic at 0.54 after rate increase earlier today. No bleeding.  Goal of Therapy:  Heparin level 0.3-0.7 units/ml Monitor platelets by anticoagulation protocol: Yes   Plan:  1) Continue heparin at 1300 units/hr 2) Follow up AM labs  Fredrik RiggerMarkle, Seleen Walter Sue 09/28/2016,5:30 PM

## 2016-09-28 NOTE — Progress Notes (Signed)
[facility]    Date of Admission:  09/15/2016      ID: [name] is a 55 y.o. female with Abscess of gallbladder fossa s/p perc drainage 09/21/2016 Principal Problem:   Intra-abdominal fluid collection s/p perc drain 10/29 Active Problems:   HYPERLIPIDEMIA, MIXED   Coronary atherosclerosis   Chronic systolic heart failure (HCC)   MITRAL VALVE REPLACEMENT, HX OF   Chronic back pain   Leukocytosis   Acute cholecystitis s/p lap cholecystectomy 09/08/2016   Nausea with vomiting   Anxiety state   CAD (coronary artery disease)   Abdominal pain   Warfarin-induced coagulopathy (HCC)   Acute kidney injury (HCC)   Anemia   Intra-abdominal infection   Subhepatic fluid collection    Subjective: Remains afebrile, still has some abdominal discomfort with nausea. No vomiting. No fevers, chills, nightsweats  Medications:  . acetaminophen  1,000 mg Oral TID  . carvedilol  25 mg Oral BID WC  . dextrose  50 mL Intravenous Once  . enalapril  5 mg Oral BID  . famotidine  20 mg Oral BID  . feeding supplement  1 Container Oral TID BM  . fentaNYL  25 mcg Transdermal Q72H  . insulin aspart  0-9 Units Subcutaneous Q4H  . lip balm   Topical BID  . ondansetron (ZOFRAN) IV  4 mg Intravenous Q6H  . potassium chloride  40 mEq Oral Once  . saccharomyces boulardii  250 mg Oral BID  . sodium chloride flush  3 mL Intravenous Q12H  . warfarin  7.5 mg Oral ONCE-1800  . Warfarin - Pharmacist Dosing Inpatient   Does not apply q1800    Objective: Vital signs in last 24 hours: Temp:  [97.1 F (36.2 C)-99.1 F (37.3 C)] 98.6 F (37 C) (11/05 0450) Pulse Rate:  [77-80] 77 (11/05 0450) Resp:  [18] 18 (11/05 0450) BP: (128-153)/(56-88) 128/61 (11/05 0450) SpO2:  [97 %-99 %] 97 % (11/05 0450) Weight:  [141 lb (64 kg)] 141 lb (64 kg) (11/05 0450) Physical Exam  Constitutional:  oriented to person, place, and time. appears well-developed and well-nourished. No  distress.  HENT: [place]/AT, PERRLA, no scleral icterus Mouth/Throat: Oropharynx is clear and moist. No oropharyngeal exudate.  Cardiovascular: Normal rate, regular rhythm and normal heart sounds. Exam reveals no gallop and no friction rub.  No murmur heard.  Pulmonary/Chest: Effort normal and breath sounds normal. No respiratory distress.  has no wheezes.  Abdominal: Soft. Bowel sounds are normal.  exhibits no distension. There is no tenderness. Right sided drain has serous fluid in bulb Psychiatric: a normal mood and affect.  behavior is normal.    Lab Results  Recent Labs  09/26/16 1955 09/27/16 0325 09/28/16 0632  WBC  --  21.0* 13.0*  HGB  --  14.1 12.7  HCT  --  40.8 38.3  NA 136  --  136  K 3.5  --  3.2*  CL 101  --  107  CO2 24  --  22  BUN 12  --  6  CREATININE 0.72  --  0.69   Liver Panel  Recent Labs  09/26/16 1427 09/26/16 1955  PROT 6.3* 6.5  ALBUMIN 2.6* 2.5*  AST 36 34  ALT 21 21  ALKPHOS 66 65  BILITOT 1.4* 1.0   Sedimentation Rate No results for input(s): ESRSEDRATE in the last 72 hours. C-Reactive Protein No results for input(s): CRP in the last 72 hours.  Microbiology: 11/2 blood cx  ngtd 11/2 urine cx multiple species Studies/Results: No results found.   Assessment/Plan: Fever and leukocytosis = appears to be improving without need of abtx. Continue to monitor off of abtx for now. It does not appear that she needs empiric coverage since she is improving  Abdominal pain = likely from recent surgery, and chronic pain. Defer to primary team for management  Will sign off  Drue SecondSNIDER, [facility] CenterCYNTHIA [facility] Cell: [phone] Pager: [phone]  09/28/2016, 12:51 PM

## 2016-09-29 ENCOUNTER — Encounter: Payer: Medicaid Other | Admitting: Cardiology

## 2016-09-29 LAB — CBC
HEMATOCRIT: 39.1 % (ref 36.0–46.0)
HEMOGLOBIN: 13 g/dL (ref 12.0–15.0)
MCH: 31.9 pg (ref 26.0–34.0)
MCHC: 33.2 g/dL (ref 30.0–36.0)
MCV: 95.8 fL (ref 78.0–100.0)
Platelets: 231 10*3/uL (ref 150–400)
RBC: 4.08 MIL/uL (ref 3.87–5.11)
RDW: 13.8 % (ref 11.5–15.5)
WBC: 12.2 10*3/uL — ABNORMAL HIGH (ref 4.0–10.5)

## 2016-09-29 LAB — GLUCOSE, CAPILLARY
GLUCOSE-CAPILLARY: 117 mg/dL — AB (ref 65–99)
GLUCOSE-CAPILLARY: 118 mg/dL — AB (ref 65–99)
GLUCOSE-CAPILLARY: 122 mg/dL — AB (ref 65–99)
GLUCOSE-CAPILLARY: 131 mg/dL — AB (ref 65–99)
GLUCOSE-CAPILLARY: 170 mg/dL — AB (ref 65–99)
Glucose-Capillary: 100 mg/dL — ABNORMAL HIGH (ref 65–99)

## 2016-09-29 LAB — PROTIME-INR
INR: 1.21
PROTHROMBIN TIME: 15.4 s — AB (ref 11.4–15.2)

## 2016-09-29 LAB — BASIC METABOLIC PANEL
ANION GAP: 9 (ref 5–15)
BUN: 6 mg/dL (ref 6–20)
CHLORIDE: 106 mmol/L (ref 101–111)
CO2: 22 mmol/L (ref 22–32)
Calcium: 8.6 mg/dL — ABNORMAL LOW (ref 8.9–10.3)
Creatinine, Ser: 0.7 mg/dL (ref 0.44–1.00)
GFR calc Af Amer: >60 mL/min (ref >60)
GFR calc non Af Amer: >60 mL/min (ref >60)
GLUCOSE: 97 mg/dL (ref 65–99)
POTASSIUM: 3.5 mmol/L (ref 3.5–5.1)
SODIUM: 137 mmol/L (ref 135–145)

## 2016-09-29 LAB — HEPARIN LEVEL (UNFRACTIONATED): Heparin Unfractionated: 0.63 IU/mL (ref 0.30–0.70)

## 2016-09-29 LAB — MAGNESIUM: MAGNESIUM: 1.7 mg/dL (ref 1.7–2.4)

## 2016-09-29 MED ORDER — WARFARIN SODIUM 7.5 MG PO TABS
7.5000 mg | ORAL_TABLET | Freq: Once | ORAL | Status: AC
  Administered 2016-09-29: 7.5 mg via ORAL
  Filled 2016-09-29: qty 1

## 2016-09-29 MED ORDER — PRO-STAT SUGAR FREE PO LIQD
30.0000 mL | Freq: Two times a day (BID) | ORAL | Status: DC
  Administered 2016-09-29 – 2016-10-02 (×6): 30 mL via ORAL
  Filled 2016-09-29 (×7): qty 30

## 2016-09-29 NOTE — Progress Notes (Signed)
Nutrition Follow-up  DOCUMENTATION CODES:   Not applicable  INTERVENTION:  Continue Boost Breeze po TID, each supplement provides 250 kcal and 9 grams of protein.  Provide 30 ml Prostat po BID, each supplement provides 100 kcal and 15 grams of protein.   Encourage adequate PO intake.   NUTRITION DIAGNOSIS:   Inadequate oral intake related to poor appetite as evidenced by meal completion < 50%; ongoing  GOAL:   Patient will meet greater than or equal to 90% of their needs; not met  MONITOR:   PO intake, Supplement acceptance, Labs, Weight trends, Skin, I & O's  REASON FOR ASSESSMENT:   Malnutrition Screening Tool    ASSESSMENT:   55 y.o. 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 Lucia Gaskins, discharged on 10/20.  Readmitted from ED on 10/23 for abnormal CT completed at [facility], indicating possible post-op complications of bile leak vs infection.  CT abd on 10/27 showed fluid/gas collection in GB fossa indicating possible infection. Percutaneous drain placed in subhepatic gallbladder fossa fluid collection 10/29. Plans to remove drains today.   Meal completion has been 25-30%. Pt reports having a lack of appetite due to abdominal pains. Pt reports pains worsen post prandial. Pt encouraged to eat bland foods that are low in fat to help with tolerance. Pt currently has Boost breeze ordered and has been consuming them. RD to additionally order Prostat to aid in adequate protein needs.   Labs and medications reviewed.   Diet Order:  Diet regular Room service appropriate? Yes; Fluid consistency: Thin  Skin:   (Incision on abdomen)  Last BM:  11/6  Height:   Ht Readings from Last 1 Encounters:  09/03/16 _0  (1.626 m)    Weight:   Wt Readings from Last 1  Encounters:  09/29/16 139 lb 15.9 oz (63.5 kg)    Ideal Body Weight:  54.5 kg  BMI:  Body mass index is 24.03 kg/m.  Estimated Nutritional Needs:   Kcal:  1800-1900  Protein:  75-85 grams  Fluid:  1.8 - 1.9 L/day  EDUCATION NEEDS:   No education needs identified at this time  Corrin Parker, MS, RD, LDN Pager # [phone] After hours/ weekend pager # [phone]

## 2016-09-29 NOTE — Consult Note (Signed)
[facility] Consult Note  Referring Provider: No ref. provider found Primary Care Physician:  Glenda Chroman, MD Primary Gastroenterologist:  Dr.  Laurel Dimmer Complaint: Epigastric pain, nausea and vomiting HPI: [name] is an 55 y.o. white female  status post cholecystectomy 09/08/2016, with subsequent gallbladder fossa abscess status post percutaneous drainage 09/21/2016 and has had continued epigastric abdominal pain nausea and vomiting intermittently and some diarrhea despite antibiotics and drainage. Recent papaya scan showed no bile leak. She denies any significant GI history prior to her cholecystectomy. She has never had an EGD or peptic ulcer disease. She states she is having a somewhat better today today only since her current admission with some tolerance of diet and less abdominal pain. She denies any melena. She denies use of nonsteroidal anti-inflammatory drugs. She is on Coumadin for coronary artery disease and history of mitral valve replacement. She has been on IV famotidine since at least yesterday.  Past Medical History:  Diagnosis Date  . Anxiety   . Arthritis   . Asthma   . Cardiomyopathy ([place])   . Chronic back pain   . COPD (chronic obstructive pulmonary disease) ([place])   . Essential hypertension   . Fibromyalgia   . History of TIA (transient ischemic attack)   . Hyperlipidemia   . Migraine    Recurrent  . Myocardial infarction, anterior wall ([place]) 2011   Late reperfusion occluded LAD treated with BMS complicated by catheter-induced left mainstem dissection also treated with BMS  . Panic attacks   . Rheumatic heart disease    St. Jude MVR 2007  . Spina bifida [facility])     Past Surgical History:  Procedure Laterality Date  . APPENDECTOMY    . CARDIAC CATHETERIZATION  06/2005; 11/2005   Archie Endo 04/08/2011   . CHOLECYSTECTOMY N/A 09/08/2016   Procedure: LAPAROSCOPIC CHOLECYSTECTOMY WITH INTRAOPERATIVE CHOLANGIOGRAM;  Surgeon: Alphonsa Overall, MD;  Location:  [place];  Service: General;  Laterality: N/A;  . CORONARY ANGIOPLASTY WITH STENT PLACEMENT  06/2010   BMS to the mLAD (culprit vessel) and L main (which dissected during the procedure), non-obs dz CFX & RCA  . MITRAL VALVE REPLACEMENT  02/2006   25 mm St. Jude mechanical prosthesis  . TUBAL LIGATION      Medications Prior to Admission  Medication Sig Dispense Refill  . carvedilol (COREG) 6.25 MG tablet Take 1 tablet (6.25 mg total) by mouth 2 (two) times daily with a meal. 60 tablet 0  . HYDROcodone-acetaminophen (NORCO/VICODIN) 5-325 MG tablet Take 1 tablet by mouth 3 (three) times daily as needed for moderate pain. 20 tablet 0  . Melatonin 3 MG CAPS Take 3 mg by mouth at bedtime as needed (for sleep).    . promethazine (PHENERGAN) 25 MG tablet Take 25 mg by mouth 2 (two) times daily as needed for nausea or vomiting.     . warfarin (COUMADIN) 5 MG tablet Take 2.5 mg once on 10/21 Start taking your usual regimen on 10/22 (7.5 mg by mouth on Monday and Thursday. Take '10mg'$  by mouth on all other days) Go to Dr Marcial Pacas office on 10/23 for INR check-and ask Dr Woody Seller for further dosing instructions 30 tablet 2  . NITROSTAT 0.4 MG SL tablet DISSOLVE ONE TABLET UNDER TONGUE EVERY 5 MINUTES UP TO 3 DOSES AS NEEDED FOR CHEST PAIN (Patient taking differently: DISSOLVE 0.4 MG UNDER TONGUE EVERY 5 MINUTES UP TO 3 DOSES AS NEEDED FOR CHEST PAIN) 25 tablet 3    Allergies:  Allergies  Allergen Reactions  . Cyclobenzaprine Hcl Nausea And Vomiting  . Latex Other (See Comments)    Unknown    Family History  Problem Relation Age of Onset  . Heart attack Father 6  . Heart attack Mother 96  . Coronary artery disease Sister     Social History:  reports that she quit smoking about 4 weeks ago. Her smoking use included Cigarettes. She has a 9.50 pack-year smoking history. She has never used smokeless tobacco. She reports that she drinks alcohol. She reports that she does not use drugs.  Review of Systems:  negative except As above   Blood pressure 139/70, pulse 75, temperature 98.2 F (36.8 C), temperature source Oral, resp. rate 16, weight 63.5 kg (139 lb 15.9 oz), SpO2 100 %. Head: Normocephalic, without obvious abnormality, atraumatic Neck: no adenopathy, no carotid bruit, no JVD, supple, symmetrical, trachea midline and thyroid not enlarged, symmetric, no tenderness/mass/nodules Resp: clear to auscultation bilaterally Cardio: regular rate and rhythm, S1, S2 normal, no murmur, click, rub or gallop GI: Abdomen soft, moderately tender with normoactive bowel sounds. No hepatosplenomegaly or mass. There is some guarding.. Extremities: extremities normal, atraumatic, no cyanosis or edema  Results for orders placed or performed during the hospital encounter of 09/15/16 (from the past 48 hour(s))  Glucose, capillary     Status: Abnormal   Collection Time: 09/27/16  5:15 PM  Result Value Ref Range   Glucose-Capillary 175 (H) 65 - 99 mg/dL   Comment 1 Notify RN    Comment 2 Document in Chart   Heparin level (unfractionated)     Status: None   Collection Time: 09/27/16  6:33 PM  Result Value Ref Range   Heparin Unfractionated 0.30 0.30 - 0.70 IU/mL    Comment:        IF HEPARIN RESULTS ARE BELOW EXPECTED VALUES, AND PATIENT DOSAGE HAS BEEN CONFIRMED, SUGGEST FOLLOW UP TESTING OF ANTITHROMBIN III LEVELS.   Glucose, capillary     Status: Abnormal   Collection Time: 09/27/16  7:49 PM  Result Value Ref Range   Glucose-Capillary 149 (H) 65 - 99 mg/dL  Glucose, capillary     Status: Abnormal   Collection Time: 09/28/16 12:04 AM  Result Value Ref Range   Glucose-Capillary 118 (H) 65 - 99 mg/dL  Glucose, capillary     Status: Abnormal   Collection Time: 09/28/16  4:56 AM  Result Value Ref Range   Glucose-Capillary 123 (H) 65 - 99 mg/dL  Protime-INR     Status: None   Collection Time: 09/28/16  6:32 AM  Result Value Ref Range   Prothrombin Time 14.4 11.4 - 15.2 seconds   INR 6.27   Basic  metabolic panel     Status: Abnormal   Collection Time: 09/28/16  6:32 AM  Result Value Ref Range   Sodium 136 135 - 145 mmol/L   Potassium 3.2 (L) 3.5 - 5.1 mmol/L   Chloride 107 101 - 111 mmol/L   CO2 22 22 - 32 mmol/L   Glucose, Bld 118 (H) 65 - 99 mg/dL   BUN 6 6 - 20 mg/dL   Creatinine, Ser 0.69 0.44 - 1.00 mg/dL   Calcium 7.6 (L) 8.9 - 10.3 mg/dL   GFR calc non Af Amer >60 >60 mL/min   GFR calc Af Amer >60 >60 mL/min    Comment: (NOTE) The eGFR has been calculated using the CKD EPI equation. This calculation has not been validated in all clinical situations. eGFR's persistently <60 mL/min signify  possible Chronic Kidney Disease.    Anion gap 7 5 - 15  CBC     Status: Abnormal   Collection Time: 09/28/16  6:32 AM  Result Value Ref Range   WBC 13.0 (H) 4.0 - 10.5 K/uL   RBC 3.94 3.87 - 5.11 MIL/uL   Hemoglobin 12.7 12.0 - 15.0 g/dL   HCT 38.3 36.0 - 46.0 %   MCV 97.2 78.0 - 100.0 fL   MCH 32.2 26.0 - 34.0 pg   MCHC 33.2 30.0 - 36.0 g/dL   RDW 13.9 11.5 - 15.5 %   Platelets 195 150 - 400 K/uL  Heparin level (unfractionated)     Status: Abnormal   Collection Time: 09/28/16  6:32 AM  Result Value Ref Range   Heparin Unfractionated 0.17 (L) 0.30 - 0.70 IU/mL    Comment:        IF HEPARIN RESULTS ARE BELOW EXPECTED VALUES, AND PATIENT DOSAGE HAS BEEN CONFIRMED, SUGGEST FOLLOW UP TESTING OF ANTITHROMBIN III LEVELS.   Glucose, capillary     Status: Abnormal   Collection Time: 09/28/16  7:41 AM  Result Value Ref Range   Glucose-Capillary 124 (H) 65 - 99 mg/dL   Comment 1 Repeat Test    Comment 2 Document in Chart   Glucose, capillary     Status: Abnormal   Collection Time: 09/28/16 11:49 AM  Result Value Ref Range   Glucose-Capillary 126 (H) 65 - 99 mg/dL   Comment 1 Repeat Test    Comment 2 Document in Chart   Glucose, capillary     Status: Abnormal   Collection Time: 09/28/16  4:23 PM  Result Value Ref Range   Glucose-Capillary 123 (H) 65 - 99 mg/dL   Comment  1 Notify RN   Heparin level (unfractionated)     Status: None   Collection Time: 09/28/16  4:47 PM  Result Value Ref Range   Heparin Unfractionated 0.54 0.30 - 0.70 IU/mL    Comment:        IF HEPARIN RESULTS ARE BELOW EXPECTED VALUES, AND PATIENT DOSAGE HAS BEEN CONFIRMED, SUGGEST FOLLOW UP TESTING OF ANTITHROMBIN III LEVELS.   Glucose, capillary     Status: Abnormal   Collection Time: 09/28/16  7:33 PM  Result Value Ref Range   Glucose-Capillary 126 (H) 65 - 99 mg/dL  Glucose, capillary     Status: Abnormal   Collection Time: 09/28/16 11:01 PM  Result Value Ref Range   Glucose-Capillary 110 (H) 65 - 99 mg/dL  Heparin level (unfractionated)     Status: None   Collection Time: 09/29/16  3:58 AM  Result Value Ref Range   Heparin Unfractionated 0.63 0.30 - 0.70 IU/mL    Comment:        IF HEPARIN RESULTS ARE BELOW EXPECTED VALUES, AND PATIENT DOSAGE HAS BEEN CONFIRMED, SUGGEST FOLLOW UP TESTING OF ANTITHROMBIN III LEVELS.   Basic metabolic panel     Status: Abnormal   Collection Time: 09/29/16  3:58 AM  Result Value Ref Range   Sodium 137 135 - 145 mmol/L   Potassium 3.5 3.5 - 5.1 mmol/L   Chloride 106 101 - 111 mmol/L   CO2 22 22 - 32 mmol/L   Glucose, Bld 97 65 - 99 mg/dL   BUN 6 6 - 20 mg/dL   Creatinine, Ser 0.70 0.44 - 1.00 mg/dL   Calcium 8.6 (L) 8.9 - 10.3 mg/dL   GFR calc non Af Amer >60 >60 mL/min   GFR calc Af Amer >  60 >60 mL/min    Comment: (NOTE) The eGFR has been calculated using the CKD EPI equation. This calculation has not been validated in all clinical situations. eGFR's persistently <60 mL/min signify possible Chronic Kidney Disease.    Anion gap 9 5 - 15  Magnesium     Status: None   Collection Time: 09/29/16  3:58 AM  Result Value Ref Range   Magnesium 1.7 1.7 - 2.4 mg/dL  CBC     Status: Abnormal   Collection Time: 09/29/16  3:58 AM  Result Value Ref Range   WBC 12.2 (H) 4.0 - 10.5 K/uL   RBC 4.08 3.87 - 5.11 MIL/uL   Hemoglobin 13.0 12.0  - 15.0 g/dL   HCT 39.1 36.0 - 46.0 %   MCV 95.8 78.0 - 100.0 fL   MCH 31.9 26.0 - 34.0 pg   MCHC 33.2 30.0 - 36.0 g/dL   RDW 13.8 11.5 - 15.5 %   Platelets 231 150 - 400 K/uL  Protime-INR     Status: Abnormal   Collection Time: 09/29/16  3:58 AM  Result Value Ref Range   Prothrombin Time 15.4 (H) 11.4 - 15.2 seconds   INR 1.21   Glucose, capillary     Status: Abnormal   Collection Time: 09/29/16  4:44 AM  Result Value Ref Range   Glucose-Capillary 100 (H) 65 - 99 mg/dL  Glucose, capillary     Status: Abnormal   Collection Time: 09/29/16  8:18 AM  Result Value Ref Range   Glucose-Capillary 118 (H) 65 - 99 mg/dL  Glucose, capillary     Status: Abnormal   Collection Time: 09/29/16 11:54 AM  Result Value Ref Range   Glucose-Capillary 131 (H) 65 - 99 mg/dL   No results found.  Assessment: Status post cholecystectomy and gallbladder fossa abscess status post radiologic drainage and antibiotics. Continued epigastric pain despite the above measures  Plan:  I discussed diagnostic EGD versus continuing empiric treatment with famotidine and watchful waiting and she would like to go ahead with EGD. This is been scheduled for 11:00 tomorrow. Adelbert Gaspard C 09/29/2016, 4:04 PM  Pager [phone] If no answer or after 5 PM call [phone]

## 2016-09-29 NOTE — Progress Notes (Addendum)
ANTICOAGULATION CONSULT NOTE - Follow Up Consult  Pharmacy Consult for Heparin Indication: MVR  Allergies  Allergen Reactions  . Cyclobenzaprine Hcl Nausea And Vomiting  . Latex Other (See Comments)    Unknown    Patient Measurements: Weight: 139 lb 15.9 oz (63.5 kg)   Vital Signs: Temp: 98.7 F (37.1 C) (11/06 0447) Temp Source: Oral (11/06 0447) BP: 130/51 (11/06 0447) Pulse Rate: 72 (11/06 0447)  Labs:  Recent Labs  09/26/16 1955  09/27/16 0325  09/28/16 [id number] 09/28/16 1647 09/29/16 0358  HGB  --   < > 14.1  --  12.7  --  13.0  HCT  --   --  40.8  --  38.3  --  39.1  PLT  --   --  220  --  195  --  231  LABPROT  --   --  16.8*  --  14.4  --  15.4*  INR  --   --  1.35  --  1.12  --  1.21  HEPARINUNFRC  --   --   --   < > 0.17* 0.54 0.63  CREATININE 0.72  --   --   --  0.69  --  0.70  < > = values in this interval not displayed.  Estimated Creatinine Clearance: 69.4 mL/min (by C-G formula based on SCr of 0.7 mg/dL).   Assessment: 55yof restarted on coumadin on 11/5 with heparin bridge for MVR. Heparin level remains therapeutic at 0.63 on 1300 units/h. INR 8.06 on 11/3 - s/p vit k. INR up 1.21 after 1 dose of 7.5mg . CBC stable. No bleeding reported.  PTA warfarin dose: 10mg  daily exc for 7.5mg  on Mon/Thurs  Goal of Therapy:  Heparin level 0.3-0.7 units/ml  INR 2.5-3.5 per MD note Monitor platelets by anticoagulation protocol: Yes   Plan:  Continue heparin at 1300 units/hr Warfarin 7.5mg  x 1 dose tonight Daily heparin level/CBC Monitor for s/sx bleeding   Babs BertinHaley Briahna Pescador, PharmD, BCPS Clinical Pharmacist 09/29/2016 8:59 AM

## 2016-09-29 NOTE — Progress Notes (Signed)
Subjective: Still complains of nausea and loose stools, Chronic pain same place, she always points to mid epigastric area.    Objective: Vital signs in last 24 hours: Temp:  [98.5 F (36.9 C)-99.5 F (37.5 C)] 98.7 F (37.1 C) (11/06 0447) Pulse Rate:  [58-79] 72 (11/06 0447) Resp:  [16] 16 (11/06 0447) BP: (130-167)/(51-70) 130/51 (11/06 0447) SpO2:  [98 %-99 %] 98 % (11/06 0447) Weight:  [63.5 kg (139 lb 15.9 oz)] 63.5 kg (139 lb 15.9 oz) (11/06 0447) Last BM Date: 09/28/16 680 PO/IV fluids dced Voided x 4 15 ml from the drain Stool x 3 recorded Afebrile VSS WBC down to 12.2 Creatinine is normal, INR 1.21 CT scan 09/25/16, unremarkable Hida 09/16/16 negative Drainage fluid:  No growth   Intake/Output from previous day: 11/05 0701 - 11/06 0700 In: 836 [P.O.:680; I.V.:156] Out: 15 [Drains:15] Intake/Output this shift: Total I/O In: 120 [P.O.:120] Out: -   General appearance: alert, cooperative and no distress Resp: clear to auscultation bilaterally GI: Soft, non tender on exam, no distension, + BS, "loose stools x 3", normal bowel sounds drainage is clear.  Lab Results:   Recent Labs  09/28/16 0632 09/29/16 0358  WBC 13.0* 12.2*  HGB 12.7 13.0  HCT 38.3 39.1  PLT 195 231    BMET  Recent Labs  09/28/16 0632 09/29/16 0358  NA 136 137  K 3.2* 3.5  CL 107 106  CO2 22 22  GLUCOSE 118* 97  BUN 6 6  CREATININE 0.69 0.70  CALCIUM 7.6* 8.6*   PT/INR  Recent Labs  09/28/16 0632 09/29/16 0358  LABPROT 14.4 15.4*  INR 1.12 1.21     Recent Labs Lab 09/24/16 1201 09/25/16 1146 09/26/16 1427 09/26/16 1955  AST 38 53* 36 34  ALT 27 25 21 21   ALKPHOS 96 107 66 65  BILITOT 0.9 1.2 1.4* 1.0  PROT 8.3* 7.9 6.3* 6.5  ALBUMIN 3.9 3.7 2.6* 2.5*     Lipase  No results found for: LIPASE   Studies/Results: No results found.  Medications: . acetaminophen  1,000 mg Oral TID  . carvedilol  25 mg Oral BID WC  . dextrose  50 mL Intravenous Once   . enalapril  5 mg Oral BID  . famotidine  20 mg Oral BID  . feeding supplement  1 Container Oral TID BM  . fentaNYL  25 mcg Transdermal Q72H  . insulin aspart  0-9 Units Subcutaneous Q4H  . lip balm   Topical BID  . ondansetron (ZOFRAN) IV  4 mg Intravenous Q6H  . saccharomyces boulardii  250 mg Oral BID  . sodium chloride flush  3 mL Intravenous Q12H  . warfarin  7.5 mg Oral ONCE-1800  . Warfarin - Pharmacist Dosing Inpatient   Does not apply q1800   . heparin 1,300 Units/hr (09/29/16 [id number])    Assessment/Plan Abdominal pain, Nausea, probably secondary to some subhepatic fluid collection S/p laparoscopic cholecystectomy10/16/17, Dr. 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   New leukocytosis - trending down Supra-theraputic INR Had gottenup to 8.06 - Discussed with pharmacy, now normalized Dehydration -continuing IV hydration, resolved, DC IVF Acute renal insuffiencey- Cr normal with  hydration  FEN:adv to regular. D/c mIVF. Add nutrition shakes. Hypokalemia - replace potassium today ID: she had 7 days of Zosyn 10/13-10/19/17 ciprofloxacin day 3 09/17/16, then discontinued. C diff negative 09/17/16 - No growth on Blood or fluid cultures no growth on GB fossa fluid. Appreciated ID input. No fever. Wbc decreasing 21 to 13 to 12 ZOX:[id number]:heparin per pharmacy, started coumadin. INR 1.21  Warfarin - 7.5 mg 09/28/16  Plan:  Very little change.  She has same ongoing complaints without anything objective to go along with complaints.  Medicine following, and main acute issues are better.  Chronic pain, abdominal pain, and nausea remain as major issues. She has been on  Pepcid or PPI since 11/17/16.  IR signed off, no antibiotics recommended.    Will check on drain removal.  No prior history of prior colonoscopy or GI evaluation      LOS: 14 days    [name] 09/29/2016 [phone]

## 2016-09-30 ENCOUNTER — Encounter (HOSPITAL_COMMUNITY): Admission: EM | Disposition: A | Discharge status: Home / Self Care | Payer: Self-pay | Source: Home / Self Care

## 2016-09-30 ENCOUNTER — Inpatient Hospital Stay (HOSPITAL_COMMUNITY): Payer: Medicaid Other | Admitting: Anesthesiology

## 2016-09-30 ENCOUNTER — Encounter (HOSPITAL_COMMUNITY): Payer: Self-pay | Admitting: *Deleted

## 2016-09-30 HISTORY — PX: ESOPHAGOGASTRODUODENOSCOPY: SHX5428

## 2016-09-30 LAB — HEPARIN LEVEL (UNFRACTIONATED)
Heparin Unfractionated: 0.77 IU/mL — ABNORMAL HIGH (ref 0.30–0.70)
Heparin Unfractionated: 0.57 IU/mL (ref 0.30–0.70)

## 2016-09-30 LAB — CULTURE, BLOOD (ROUTINE X 2)
CULTURE: NO GROWTH
Culture: NO GROWTH

## 2016-09-30 LAB — CBC
HCT: 41.3 % (ref 36.0–46.0)
Hemoglobin: 14 g/dL (ref 12.0–15.0)
MCH: 32.1 pg (ref 26.0–34.0)
MCHC: 33.9 g/dL (ref 30.0–36.0)
MCV: 94.7 fL (ref 78.0–100.0)
PLATELETS: 243 10*3/uL (ref 150–400)
RBC: 4.36 MIL/uL (ref 3.87–5.11)
RDW: 13.7 % (ref 11.5–15.5)
WBC: 14.2 10*3/uL — ABNORMAL HIGH (ref 4.0–10.5)

## 2016-09-30 LAB — PROTIME-INR
INR: 1.48
Prothrombin Time: 18.1 seconds — ABNORMAL HIGH (ref 11.4–15.2)

## 2016-09-30 LAB — GLUCOSE, CAPILLARY
GLUCOSE-CAPILLARY: 120 mg/dL — AB (ref 65–99)
GLUCOSE-CAPILLARY: 119 mg/dL — AB (ref 65–99)
GLUCOSE-CAPILLARY: 110 mg/dL — AB (ref 65–99)
GLUCOSE-CAPILLARY: 131 mg/dL — AB (ref 65–99)
Glucose-Capillary: 109 mg/dL — ABNORMAL HIGH (ref 65–99)

## 2016-09-30 SURGERY — EGD (ESOPHAGOGASTRODUODENOSCOPY)
Anesthesia: Monitor Anesthesia Care | Laterality: Left

## 2016-09-30 MED ORDER — SODIUM CHLORIDE 0.9 % IV SOLN: INTRAVENOUS | Status: DC

## 2016-09-30 MED ORDER — WARFARIN SODIUM 7.5 MG PO TABS
7.5000 mg | ORAL_TABLET | Freq: Once | ORAL | Status: AC
  Administered 2016-09-30: 7.5 mg via ORAL
  Filled 2016-09-30: qty 1

## 2016-09-30 MED ORDER — AMOXICILLIN 500 MG PO CAPS
500.0000 mg | ORAL_CAPSULE | Freq: Two times a day (BID) | ORAL | Status: DC
  Administered 2016-09-30 – 2016-10-07 (×15): 500 mg via ORAL
  Filled 2016-09-30 (×15): qty 1

## 2016-09-30 MED ORDER — PROPOFOL 10 MG/ML IV BOLUS
INTRAVENOUS | Status: DC | PRN
  Administered 2016-09-30 (×2): 20 mg via INTRAVENOUS

## 2016-09-30 MED ORDER — FENTANYL CITRATE (PF) 100 MCG/2ML IJ SOLN
12.5000 ug | INTRAMUSCULAR | Status: DC | PRN
  Administered 2016-09-30 (×2): 12.5 ug via INTRAVENOUS
  Filled 2016-09-30 (×2): qty 2

## 2016-09-30 MED ORDER — CLARITHROMYCIN 500 MG PO TABS
500.0000 mg | ORAL_TABLET | Freq: Two times a day (BID) | ORAL | Status: DC
  Administered 2016-09-30 – 2016-10-07 (×15): 500 mg via ORAL
  Filled 2016-09-30 (×16): qty 1

## 2016-09-30 MED ORDER — SODIUM CHLORIDE 0.9 % IV SOLN
40.0000 mg | Freq: Two times a day (BID) | INTRAVENOUS | Status: DC
  Administered 2016-09-30: 40 mg via INTRAVENOUS
  Filled 2016-09-30 (×2): qty 4

## 2016-09-30 MED ORDER — BUTAMBEN-TETRACAINE-BENZOCAINE 2-2-14 % EX AERO
INHALATION_SPRAY | CUTANEOUS | Status: DC | PRN
  Administered 2016-09-30: 1 via TOPICAL

## 2016-09-30 MED ORDER — LACTATED RINGERS IV SOLN
INTRAVENOUS | Status: DC
Start: 2016-09-30 — End: 2016-10-16
  Administered 2016-09-30 – 2016-10-12 (×4): via INTRAVENOUS

## 2016-09-30 MED ORDER — PANTOPRAZOLE SODIUM 40 MG PO TBEC
40.0000 mg | DELAYED_RELEASE_TABLET | Freq: Two times a day (BID) | ORAL | Status: DC
  Administered 2016-09-30 – 2016-10-16 (×29): 40 mg via ORAL
  Filled 2016-09-30 (×30): qty 1

## 2016-09-30 MED ORDER — PROPOFOL 500 MG/50ML IV EMUL
INTRAVENOUS | Status: DC | PRN
  Administered 2016-09-30: 75 ug/kg/min via INTRAVENOUS

## 2016-09-30 MED ORDER — OXYCODONE HCL 5 MG PO TABS
10.0000 mg | ORAL_TABLET | Freq: Three times a day (TID) | ORAL | Status: DC | PRN
  Administered 2016-09-30 – 2016-10-01 (×2): 10 mg via ORAL
  Filled 2016-09-30 (×2): qty 2

## 2016-09-30 MED ORDER — SUCRALFATE 1 GM/10ML PO SUSP
1.0000 g | Freq: Three times a day (TID) | ORAL | Status: DC
  Administered 2016-09-30 – 2016-10-16 (×54): 1 g via ORAL
  Filled 2016-09-30 (×55): qty 10

## 2016-09-30 NOTE — Anesthesia Preprocedure Evaluation (Signed)
Anesthesia Evaluation  Patient identified by MRN, date of birth, ID band Patient awake    Reviewed: Allergy & Precautions, NPO status , Patient's Chart, lab work & pertinent test results  Airway Mallampati: II  TM Distance: >3 FB Neck ROM: Full    Dental  (+) Teeth Intact, Dental Advisory Given   Pulmonary former smoker,    breath sounds clear to auscultation       Cardiovascular hypertension,  Rhythm:Regular Rate:Normal     Neuro/Psych    GI/Hepatic   Endo/Other    Renal/GU      Musculoskeletal   Abdominal   Peds  Hematology   Anesthesia Other Findings   Reproductive/Obstetrics                             Anesthesia Physical Anesthesia Plan  ASA: III  Anesthesia Plan: MAC   Post-op Pain Management:    Induction: Intravenous  Airway Management Planned: Natural Airway and Nasal Cannula  Additional Equipment:   Intra-op Plan:   Post-operative Plan:   Informed Consent: I have reviewed the patients History and Physical, chart, labs and discussed the procedure including the risks, benefits and alternatives for the proposed anesthesia with the patient or authorized representative who has indicated his/her understanding and acceptance.     Plan Discussed with: CRNA and Anesthesiologist  Anesthesia Plan Comments:         Anesthesia Quick Evaluation

## 2016-09-30 NOTE — Anesthesia Postprocedure Evaluation (Signed)
Anesthesia Post Note  Patient: [name]  Procedure(s) Performed: Procedure(s) (LRB): ESOPHAGOGASTRODUODENOSCOPY (EGD) (Left)  Patient location during evaluation: Endoscopy Anesthesia Type: MAC Level of consciousness: awake, awake and alert and oriented Pain management: pain level controlled Vital Signs Assessment: post-procedure vital signs reviewed and stable Respiratory status: spontaneous breathing, nonlabored ventilation and respiratory function stable Cardiovascular status: blood pressure returned to baseline Anesthetic complications: no    Last Vitals:  Vitals:   09/30/16 1125 09/30/16 1141  BP: (!) 196/93 (!) 164/79  Pulse: 82 86  Resp: (!) 32 16  Temp:  36.6 C    Last Pain:  Vitals:   09/30/16 1141  TempSrc: Oral  PainSc:                  Shiron Whetsel COKER

## 2016-09-30 NOTE — Progress Notes (Signed)
Patient ID: [name], female   DOB: [birth year], 55 y.o.   MRN: [id number]  GB fossa drain removed per Dr Miles CostainShick order Without complication  Dressing applied

## 2016-09-30 NOTE — Transfer of Care (Signed)
Immediate Anesthesia Transfer of Care Note  Patient: [name]  Procedure(s) Performed: Procedure(s): ESOPHAGOGASTRODUODENOSCOPY (EGD) (Left)  Patient Location: Endoscopy Unit  Anesthesia Type:MAC  Level of Consciousness: awake, alert  and oriented  Airway & Oxygen Therapy: Patient Spontanous Breathing and Patient connected to nasal cannula oxygen  Post-op Assessment: Report given to RN and Post -op Vital signs reviewed and stable  Post vital signs: Reviewed and stable  Last Vitals:  Vitals:   09/30/16 0953 09/30/16 1120  BP: (!) 153/84 (!) 205/86  Pulse: 90 82  Resp: 18 (!) 31  Temp: 37.6 C     Last Pain:  Vitals:   09/30/16 1120  TempSrc: Oral  PainSc:       Patients Stated Pain Goal: 3 (09/30/16 0953)  Complications: No apparent anesthesia complications

## 2016-09-30 NOTE — Progress Notes (Signed)
Patient ID: [name], female   DOB: 1961-09-29, 55 y.o.   MRN: [id number]    Referring Physician(s): Dr. Manus RuddMatthew Tsuei  Supervising Physician: Ruel FavorsShick, Trevor  [facility]  Chief Complaint: GB fossa fluid collection  Subjective: Sleeping, no complaints of much pain in RUQ  Allergies: Cyclobenzaprine hcl and Latex  Medications: Prior to Admission medications   Medication Sig Start Date End Date Taking? Authorizing Provider  carvedilol (COREG) 6.25 MG tablet Take 1 tablet (6.25 mg total) by mouth 2 (two) times daily with a meal. 09/12/16  Yes Shanker Levora DredgeM Ghimire, MD  HYDROcodone-acetaminophen (NORCO/VICODIN) 5-325 MG tablet Take 1 tablet by mouth 3 (three) times daily as needed for moderate pain. 09/12/16  Yes Shanker Levora DredgeM Ghimire, MD  Melatonin 3 MG CAPS Take 3 mg by mouth at bedtime as needed (for sleep).   Yes Historical Provider, MD  promethazine (PHENERGAN) 25 MG tablet Take 25 mg by mouth 2 (two) times daily as needed for nausea or vomiting.  02/26/12  Yes Historical Provider, MD  warfarin (COUMADIN) 5 MG tablet Take 2.5 mg once on 10/21 Start taking your usual regimen on 10/22 (7.5 mg by mouth on Monday and Thursday. Take 10mg  by mouth on all other days) Go to Dr Bonnita LevanVyas's office on 10/23 for INR check-and ask Dr Sherril CroonVyas for further dosing instructions 09/12/16  Yes Shanker Levora DredgeM Ghimire, MD  NITROSTAT 0.4 MG SL tablet DISSOLVE ONE TABLET UNDER TONGUE EVERY 5 MINUTES UP TO 3 DOSES AS NEEDED FOR CHEST PAIN Patient taking differently: DISSOLVE 0.4 MG UNDER TONGUE EVERY 5 MINUTES UP TO 3 DOSES AS NEEDED FOR CHEST PAIN 03/22/15   Jonelle SidleSamuel G McDowell, MD    Vital Signs: BP (!) 151/58 (BP Location: Left Arm)   Pulse 75   Temp 98.7 F (37.1 C) (Oral)   Resp 17   Wt 139 lb 12.8 oz (63.4 kg)   SpO2 98%   BMI 24.00 kg/m   Physical Exam: Abd: soft, minimal RUQ soreness around, drain with minimal serous drainage, 10cc yesterday.  Drain site is c/d/i  Imaging: No results  found.  Labs:  CBC:  Recent Labs  09/27/16 0325 09/28/16 0632 09/29/16 0358 09/30/16 0802  WBC 21.0* 13.0* 12.2* 14.2*  HGB 14.1 12.7 13.0 14.0  HCT 40.8 38.3 39.1 41.3  PLT 220 195 231 243    COAGS:  Recent Labs  09/04/16 0515  09/15/16 2224  09/27/16 0325 09/28/16 0632 09/29/16 0358 09/30/16 0802  INR 1.83  < > 3.08  < > 1.35 1.12 1.21 1.48  APTT 92*  --  51*  --   --   --   --   --   < > = values in this interval not displayed.  BMP:  Recent Labs  09/26/16 1427 09/26/16 1955 09/28/16 0632 09/29/16 0358  NA 133* 136 136 137  K 3.3* 3.5 3.2* 3.5  CL 99* 101 107 106  CO2 24 24 22 22   GLUCOSE 152* 160* 118* 97  BUN 15 12 6 6   CALCIUM 8.3* 8.2* 7.6* 8.6*  CREATININE 0.71 0.72 0.69 0.70  GFRNONAA >60 >60 >60 >60  GFRAA >60 >60 >60 >60    LIVER FUNCTION TESTS:  Recent Labs  09/24/16 1201 09/25/16 1146 09/26/16 1427 09/26/16 1955  BILITOT 0.9 1.2 1.4* 1.0  AST 38 53* 36 34  ALT 27 25 21 21   ALKPHOS 96 107 66 65  PROT 8.3* 7.9 6.3* 6.5  ALBUMIN 3.9 3.7 2.6* 2.5*  Assessment and Plan: 1. S/p GB fossa drain placement on 10/29 -repeat scan on 11/2 showed improved in this fluid collection.  Cultures have been negative to date. She is having minimal output.  Will d/w Dr. Miles CostainShick to determine if this can be removed soon or at least before she goes home. -will follow  Electronically Signed: Bee Hammerschmidt E 09/30/2016, 9:41 AM   I spent a total of 15 Minutes at the the patient's bedside AND on the patient's hospital floor or unit, greater than 50% of which was counseling/coordinating care for GB fossa drain placement

## 2016-09-30 NOTE — Progress Notes (Signed)
Day of Surgery  Subjective: She is just back from the EGD.  She is pretty comfortable right now.   Objective: Vital signs in last 24 hours: Temp:  [98.2 F (36.8 C)-99.7 F (37.6 C)] 98.3 F (36.8 C) (11/07 1120) Pulse Rate:  [74-90] 82 (11/07 1125) Resp:  [16-32] 32 (11/07 1125) BP: (139-205)/(58-93) 196/93 (11/07 1125) SpO2:  [97 %-100 %] 99 % (11/07 1125) Weight:  [63.4 kg (139 lb 12.8 oz)] 63.4 kg (139 lb 12.8 oz) (11/07 0630) Last BM Date: 09/29/16 360 PO recorded Voided x 4 Drain 10 ml Stool x 1 recorded Afebrile, BP up at 11 AM, otherwise stable WBC 14.2 INR 1.48 EGD:  LA grade C reflux esophagitis and duodenitis Intake/Output from previous day: 11/06 0701 - 11/07 0700 In: 513.9 [P.O.:360; I.V.:143.9] Out: 10 [Drains:10] Intake/Output this shift: Total I/O In: 200 [I.V.:200] Out: -   General appearance: alert, cooperative and no distress Resp: clear to auscultation bilaterally GI: soft, not tender, + bS, no distension.  Lab Results:   Recent Labs  09/29/16 0358 09/30/16 0802  WBC 12.2* 14.2*  HGB 13.0 14.0  HCT 39.1 41.3  PLT 231 243    BMET  Recent Labs  09/28/16 0632 09/29/16 0358  NA 136 137  K 3.2* 3.5  CL 107 106  CO2 22 22  GLUCOSE 118* 97  BUN 6 6  CREATININE 0.69 0.70  CALCIUM 7.6* 8.6*   PT/INR  Recent Labs  09/29/16 0358 09/30/16 0802  LABPROT 15.4* 18.1*  INR 1.21 1.48     Recent Labs Lab 09/24/16 1201 09/25/16 1146 09/26/16 1427 09/26/16 1955  AST 38 53* 36 34  ALT 27 25 21 21   ALKPHOS 96 107 66 65  BILITOT 0.9 1.2 1.4* 1.0  PROT 8.3* 7.9 6.3* 6.5  ALBUMIN 3.9 3.7 2.6* 2.5*     Lipase  No results found for: LIPASE   Studies/Results: No results found.  Medications: . [MAR Hold] acetaminophen  1,000 mg Oral TID  . [MAR Hold] carvedilol  25 mg Oral BID WC  . [MAR Hold] dextrose  50 mL Intravenous Once  . [MAR Hold] enalapril  5 mg Oral BID  . [MAR Hold] famotidine  20 mg Oral BID  . [MAR Hold] feeding  supplement  1 Container Oral TID BM  . [MAR Hold] feeding supplement (PRO-STAT SUGAR FREE 64)  30 mL Oral BID  . [MAR Hold] fentaNYL  25 mcg Transdermal Q72H  . [MAR Hold] insulin aspart  0-9 Units Subcutaneous Q4H  . [MAR Hold] lip balm   Topical BID  . [MAR Hold] ondansetron (ZOFRAN) IV  4 mg Intravenous Q6H  . [MAR Hold] saccharomyces boulardii  250 mg Oral BID  . [MAR Hold] sodium chloride flush  3 mL Intravenous Q12H  . [MAR Hold] warfarin  7.5 mg Oral ONCE-1800  . [MAR Hold] Warfarin - Pharmacist Dosing Inpatient   Does not apply q1800    Assessment/Plan Abdominal pain, Nausea, probably secondary to some subhepatic fluid collection S/p laparoscopic cholecystectomy10/16/17, Dr. Ezzard StandingNewman Negative HIDA 09/16/16 IR placement of drain 09/21/16- no growth on fluid cultured EGD 09/30/16:  EGD:  La grade C reflux esophagitis and duodenitis - IV Protonix H Pylori is positive  - will start  Rx:  Amoxicillin 1 gm BID, Clarithromycin 500 mg BID and she is on pepcid IV, will change to PPI PO when OK with Dr. Madilyn FiremanHayes. 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   New leukocytosis- trending down Supra-theraputic INR Had gottenup to 8.06 - Discussed with pharmacy, now normalized Dehydration -continuing IV hydration, resolved, DC IVF Acute renal insuffiencey- Cr normal with hydration  FEN:adv to regular. D/cmIVF. Add nutrition shakes. Hypokalemia - replace potassium today ID: she had 7 days of Zosyn 10/13-10/19/17 ciprofloxacin day 3 09/17/16, then discontinued. C diff negative 09/17/16 - No growth on Blood or fluid cultures no growth on GB fossa fluid. Appreciated ID input. No fever. Wbc decreasing 21 to 13 to 12 H Pylori - Amoxicillin 1 gm BID, Clarithromycin 500 mg  BID start 09/30/16 ZOX:WRUEAVWVTE:heparin per pharmacy, started coumadin. INR 1.21  Warfarin - 7.5 mg 09/28/16   Plan:  Appreciate Dr. Madilyn FiremanHayes assistance. IV Protonix is not available.  I talked with Dr. Madilyn FiremanHayes, will go with oral PPI, add Carafate, initiate treatment for H pylori.    She took 225 mcg of Fentanyl yesterday.  I am going to put her back on her Oxycodone she has been on for 2 years and wean her off the fentanyl. Decrease to 12.5 q3h PRN she has been getting 25 mcg q2h PRN.     I think her pain issues are as significant as her other issues.  I have discussed resuming oxycodone 10 mg TID,  get her back on that with the aim of sending her home on prior 10 mg Oxycodone TID for pain and let her follow up with pain clinic at home as she was instructed to do before.  I told her we could give her a week of pain meds for home, but no more.  Pharmacy is working on getting her therapeutic on her warfarin.  IR will pull her drain today  Recheck labs in AM.    LOS: 15 days    Megham Dwyer 09/30/2016 [phone]

## 2016-09-30 NOTE — Progress Notes (Signed)
[facility] Progress Note  Subjective: Still feeling poorly with dyspepsia and diarrhea.  Objective: Vital signs in last 24 hours: Temp:  [98.2 F (36.8 C)-99.7 F (37.6 C)] 99.7 F (37.6 C) (11/07 0953) Pulse Rate:  [74-90] 90 (11/07 0953) Resp:  [16-18] 18 (11/07 0953) BP: (139-161)/(58-84) 153/84 (11/07 0953) SpO2:  [97 %-100 %] 97 % (11/07 0953) Weight:  [63.4 kg (139 lb 12.8 oz)] 63.4 kg (139 lb 12.8 oz) (11/07 0630) Weight change: -0.087 kg (-3.1 oz)   PE: Unchanged  Lab Results: Results for orders placed or performed during the hospital encounter of 09/15/16 (from the past 24 hour(s))  Glucose, capillary     Status: Abnormal   Collection Time: 09/29/16 11:54 AM  Result Value Ref Range   Glucose-Capillary 131 (H) 65 - 99 mg/dL  Glucose, capillary     Status: Abnormal   Collection Time: 09/29/16  4:30 PM  Result Value Ref Range   Glucose-Capillary 170 (H) 65 - 99 mg/dL  Glucose, capillary     Status: Abnormal   Collection Time: 09/29/16  8:31 PM  Result Value Ref Range   Glucose-Capillary 117 (H) 65 - 99 mg/dL  Glucose, capillary     Status: Abnormal   Collection Time: 09/29/16 11:59 PM  Result Value Ref Range   Glucose-Capillary 122 (H) 65 - 99 mg/dL  Glucose, capillary     Status: Abnormal   Collection Time: 09/30/16  5:10 AM  Result Value Ref Range   Glucose-Capillary 120 (H) 65 - 99 mg/dL  Heparin level (unfractionated)     Status: Abnormal   Collection Time: 09/30/16  8:02 AM  Result Value Ref Range   Heparin Unfractionated 0.77 (H) 0.30 - 0.70 IU/mL  CBC     Status: Abnormal   Collection Time: 09/30/16  8:02 AM  Result Value Ref Range   WBC 14.2 (H) 4.0 - 10.5 K/uL   RBC 4.36 3.87 - 5.11 MIL/uL   Hemoglobin 14.0 12.0 - 15.0 g/dL   HCT 16.141.3 09.636.0 - 04.546.0 %   MCV 94.7 78.0 - 100.0 fL   MCH 32.1 26.0 - 34.0 pg   MCHC 33.9 30.0 - 36.0 g/dL   RDW 40.913.7 81.111.5 - 91.415.5 %   Platelets 243 150 - 400 K/uL  Protime-INR     Status: Abnormal   Collection  Time: 09/30/16  8:02 AM  Result Value Ref Range   Prothrombin Time 18.1 (H) 11.4 - 15.2 seconds   INR 1.48   Glucose, capillary     Status: Abnormal   Collection Time: 09/30/16  8:24 AM  Result Value Ref Range   Glucose-Capillary 119 (H) 65 - 99 mg/dL    Studies/Results: No results found.    Assessment: Nausea vomiting abdominal pain diarrhea, status post cholecystectomy with postop gallbladder fossa abscess EGD today showed moderately severe distal erosive esophagitis as well as a diffuse duodenitis with duodenal erosions. Unable to perform biopsies due to recent heparin administration  Plan: Would switch back over to Protonix 40 mg IV every 12 hours We'll obtain tissue transglutaminase transglutaminase and H. pylori antibody to screen for celiac disease and H. pylori infection respectively. We'll follow-up on these results.    Inice Sanluis C 09/30/2016, 11:20 AM  Pager [phone] If no answer or after 5 PM call [phone]

## 2016-09-30 NOTE — Op Note (Signed)
[facility] Patient Name: [name] Procedure Date : 09/30/2016 MRN: [id number] Attending MD: Barrie Folk , MD Date of Birth: [birth year] CSN: [id number] Age: 55 Admit Type: Inpatient Procedure:                Upper GI endoscopy Indications:              Epigastric abdominal pain Providers:                Everardo All. Madilyn Fireman, MD, Harold Barban, RN, Clearnce Sorrel,                            Technician, Dairl Ponder, CRNA Referring MD:              Medicines:                Propofol per Anesthesia Complications:            No immediate complications. Estimated Blood Loss:     Estimated blood loss: none. Procedure:                Pre-Anesthesia Assessment:                           - Prior to the procedure, a History and Physical                            was performed, and patient medications and                            allergies were reviewed. The patient's tolerance of                            previous anesthesia was also reviewed. The risks                            and benefits of the procedure and the sedation                            options and risks were discussed with the patient.                            All questions were answered, and informed consent                            was obtained. Prior Anticoagulants: The patient has                            taken Coumadin (warfarin), last dose was 4 days                            prior to procedure. ASA Grade Assessment: III - A                            patient with severe systemic disease. After  reviewing the risks and benefits, the patient was                            deemed in satisfactory condition to undergo the                            procedure.                           After obtaining informed consent, the endoscope was                            passed under direct vision. Throughout the                            procedure, the patient's blood pressure, pulse,  and                            oxygen saturations were monitored continuously. The                            [id number] ([id number] scope was introduced through the                            mouth, and advanced to the second part of duodenum.                            The upper GI endoscopy was accomplished without                            difficulty. The patient tolerated the procedure                            well. Scope In: Scope Out: Findings:      LA Grade C (one or more mucosal breaks continuous between tops of 2 or       more mucosal folds, less than 75% circumference) esophagitis was found.      A small amount of food (residue) was found in the gastric body.      Diffuse moderate inflammation characterized by congestion (edema),       erythema, granularity and shallow ulcerations was found in the entire       duodenum. Impression:               - LA Grade C reflux esophagitis.                           - A small amount of food (residue) in the stomach.                           - Duodenitis.                           - No specimens collected. Moderate Sedation:      Moderate (conscious) sedation was. [Parameters Monitored]. [Procedure       Duration Time]. Recommendation:           -  Use Protonix (pantoprazole) 80 mg IV daily.                           - Resume previous diet - advance as tolerated [Diet                            Recommendation] [Duration].                           - Continue present medications. Procedure Code(s):        --- Professional ---                           [phone], Esophagogastroduodenoscopy, flexible,                            transoral; diagnostic, including collection of                            specimen(s) by brushing or washing, when performed                            (separate procedure) Diagnosis Code(s):        --- Professional ---                           K21.0, Gastro-esophageal reflux disease with                             esophagitis                           K29.80, Duodenitis without bleeding                           R10.13, Epigastric pain CPT copyright 2016 [employer] Association. All rights reserved. The codes documented in this report are preliminary and upon coder review may  be revised to meet current compliance requirements. Barrie FolkJohn C Avien Taha, MD 09/30/2016 11:27:14 AM This report has been signed electronically. Number of Addenda: 0

## 2016-09-30 NOTE — Progress Notes (Signed)
ANTICOAGULATION CONSULT NOTE - Follow Up Consult  Pharmacy Consult for Heparin and Coumadin Indication: mechanical MVR  Allergies  Allergen Reactions  . Cyclobenzaprine Hcl Nausea And Vomiting  . Latex Other (See Comments)    Unknown    Patient Measurements: Height: 5\' 4"  (162.6 cm) Weight: 139 lb 12.8 oz (63.4 kg) IBW/kg (Calculated) : 54.7 Heparin Dosing Weight: 63.4 kg  Vital Signs: Temp: 98.9 F (37.2 C) (11/07 2027) Temp Source: Oral (11/07 2027) BP: 131/66 (11/07 2027) Pulse Rate: 84 (11/07 2027)  Labs:  Recent Labs  09/28/16 [id number]  09/29/16 0358 09/30/16 0802 09/30/16 1929  HGB 12.7  --  13.0 14.0  --   HCT 38.3  --  39.1 41.3  --   PLT 195  --  231 243  --   LABPROT 14.4  --  15.4* 18.1*  --   INR 1.12  --  1.21 1.48  --   HEPARINUNFRC 0.17*  < > 0.63 0.77* 0.57  CREATININE 0.69  --  0.70  --   --   < > = values in this interval not displayed.  Estimated Creatinine Clearance: 69.4 mL/min (by C-G formula based on SCr of 0.7 mg/dL).  Assessment:  55yof restarted on coumadin on 11/5 with heparin bridge for MVR.  INR 8.06 on 11/3 - s/p vit k. INR up 1.48 after 2 doses, conservative dosing for now with labile INR.    Heparin was off for procedures this morning; resumed at 12noon at 1300 units/hr.      Heparin level is therapeutic (0.57) tonight.  Coumadin 7.5 mg given tonight. Amoxicillin and Biaxin added today for H pylori. Biaxin sometimes effects INR.  Goal of Therapy:  Heparin level 0.3-0.7 units/ml INR 2.5-3.5 Monitor platelets by anticoagulation protocol: Yes   Plan:   Continue heparin drip at 1300 units/hr.  Daily heparin level, PT/INR and CBC.  Daily Coumadin dosing. Monitor for potential Biaxin effects on INR.  Dennie Fettersgan, Deeya Richeson Donovan, [place] Pager: [phone] 09/30/2016,9:11 PM

## 2016-09-30 NOTE — Progress Notes (Addendum)
ANTICOAGULATION CONSULT NOTE - Follow Up Consult  Pharmacy Consult for Heparin Indication: MVR  Allergies  Allergen Reactions  . Cyclobenzaprine Hcl Nausea And Vomiting  . Latex Other (See Comments)    Unknown    Patient Measurements: Weight: 139 lb 12.8 oz (63.4 kg)   Vital Signs: Temp: 98.7 F (37.1 C) (11/07 0513) Temp Source: Oral (11/07 0513) BP: 151/58 (11/07 0513) Pulse Rate: 75 (11/07 0513)  Labs:  Recent Labs  09/28/16 [id number] 09/28/16 1647 09/29/16 0358 09/30/16 0802  HGB 12.7  --  13.0 14.0  HCT 38.3  --  39.1 41.3  PLT 195  --  231 243  LABPROT 14.4  --  15.4* 18.1*  INR 1.12  --  1.21 1.48  HEPARINUNFRC 0.17* 0.54 0.63 0.77*  CREATININE 0.69  --  0.70  --     Estimated Creatinine Clearance: 69.4 mL/min (by C-G formula based on SCr of 0.7 mg/dL).   Assessment: 55yof restarted on coumadin on 11/5 with heparin bridge for MVR. Heparin level supratherapeutic at 0.77 on 1300 units/h this AM - however, RN reports heparin drip now off for procedure. To let Pharmacy know when resumed. INR 8.06 on 11/3 - s/p vit k. INR up 1.48 after 2 doses, conservative dosing for now with labile INR. CBC stable. No bleeding reported.  PTA warfarin dose: 10mg  daily exc for 7.5mg  on Mon/Thurs  Goal of Therapy:  Heparin level 0.3-0.7 units/ml  INR 2.5-3.5 per MD note Monitor platelets by anticoagulation protocol: Yes   Plan:  Heparin on hold this AM for procedure - RN to notify when resumed Warfarin 7.5mg  x 1 dose tonight Daily heparin level/INR/CBC Monitor for s/sx bleeding   Babs BertinHaley Jerian Morais, PharmD, BCPS Clinical Pharmacist 09/30/2016 9:00 AM    ADDENDUM: Heparin resumed post-procedure at ~1200 per RN at previous rate 1300 units/h. Will re-time level for 6 hours post-restart.  Plan:  Heparin resumed at 1300 units/h at ~1200 per RN Warfarin 7.5mg  x 1 dose tonight 6h heparin level Daily heparin level/INR/CBC Monitor for s/sx bleeding   Babs BertinHaley Capricia Serda, PharmD,  BCPS Clinical Pharmacist 09/30/2016 1:26 PM

## 2016-10-01 ENCOUNTER — Encounter: Payer: Self-pay | Admitting: Cardiology

## 2016-10-01 LAB — COMPREHENSIVE METABOLIC PANEL
ALT: 28 U/L (ref 14–54)
ANION GAP: 11 (ref 5–15)
AST: 38 U/L (ref 15–41)
Albumin: 2.9 g/dL — ABNORMAL LOW (ref 3.5–5.0)
Alkaline Phosphatase: 92 U/L (ref 38–126)
BILIRUBIN TOTAL: 0.8 mg/dL (ref 0.3–1.2)
BUN: 10 mg/dL (ref 6–20)
CO2: 19 mmol/L — ABNORMAL LOW (ref 22–32)
Calcium: 8.8 mg/dL — ABNORMAL LOW (ref 8.9–10.3)
Chloride: 102 mmol/L (ref 101–111)
Creatinine, Ser: 0.79 mg/dL (ref 0.44–1.00)
Glucose, Bld: 146 mg/dL — ABNORMAL HIGH (ref 65–99)
POTASSIUM: 3.4 mmol/L — AB (ref 3.5–5.1)
Sodium: 132 mmol/L — ABNORMAL LOW (ref 135–145)
TOTAL PROTEIN: 6.7 g/dL (ref 6.5–8.1)

## 2016-10-01 LAB — PROTIME-INR
INR: 1.9
PROTHROMBIN TIME: 22 s — AB (ref 11.4–15.2)

## 2016-10-01 LAB — BASIC METABOLIC PANEL
ANION GAP: 13 (ref 5–15)
BUN: 7 mg/dL (ref 6–20)
CHLORIDE: 102 mmol/L (ref 101–111)
CO2: 19 mmol/L — ABNORMAL LOW (ref 22–32)
Calcium: 9.3 mg/dL (ref 8.9–10.3)
Creatinine, Ser: 0.96 mg/dL (ref 0.44–1.00)
GFR calc Af Amer: >60 mL/min (ref >60)
GFR calc non Af Amer: >60 mL/min (ref >60)
Glucose, Bld: 151 mg/dL — ABNORMAL HIGH (ref 65–99)
POTASSIUM: 3.7 mmol/L (ref 3.5–5.1)
SODIUM: 134 mmol/L — AB (ref 135–145)

## 2016-10-01 LAB — CBC
HCT: 43 % (ref 36.0–46.0)
HEMOGLOBIN: 14.8 g/dL (ref 12.0–15.0)
MCH: 32.4 pg (ref 26.0–34.0)
MCHC: 34.4 g/dL (ref 30.0–36.0)
MCV: 94.1 fL (ref 78.0–100.0)
Platelets: 263 10*3/uL (ref 150–400)
RBC: 4.57 MIL/uL (ref 3.87–5.11)
RDW: 13.5 % (ref 11.5–15.5)
WBC: 22 10*3/uL — ABNORMAL HIGH (ref 4.0–10.5)

## 2016-10-01 LAB — URINE MICROSCOPIC-ADD ON

## 2016-10-01 LAB — URINALYSIS, ROUTINE W REFLEX MICROSCOPIC
BILIRUBIN URINE: NEGATIVE
GLUCOSE, UA: NEGATIVE mg/dL
KETONES UR: NEGATIVE mg/dL
Leukocytes, UA: NEGATIVE
Nitrite: NEGATIVE
PH: 5.5 (ref 5.0–8.0)
Protein, ur: NEGATIVE mg/dL
Specific Gravity, Urine: 1.012 (ref 1.005–1.030)

## 2016-10-01 LAB — GLUCOSE, CAPILLARY
GLUCOSE-CAPILLARY: 146 mg/dL — AB (ref 65–99)
GLUCOSE-CAPILLARY: 159 mg/dL — AB (ref 65–99)
GLUCOSE-CAPILLARY: 159 mg/dL — AB (ref 65–99)
Glucose-Capillary: 114 mg/dL — ABNORMAL HIGH (ref 65–99)
Glucose-Capillary: 124 mg/dL — ABNORMAL HIGH (ref 65–99)
Glucose-Capillary: 117 mg/dL — ABNORMAL HIGH (ref 65–99)

## 2016-10-01 LAB — HEPARIN LEVEL (UNFRACTIONATED)
Heparin Unfractionated: 0.83 IU/mL — ABNORMAL HIGH (ref 0.30–0.70)
Heparin Unfractionated: 0.79 IU/mL — ABNORMAL HIGH (ref 0.30–0.70)
Heparin Unfractionated: 0.39 IU/mL (ref 0.30–0.70)

## 2016-10-01 MED ORDER — SODIUM CHLORIDE 0.9 % IV BOLUS (SEPSIS)
1000.0000 mL | Freq: Once | INTRAVENOUS | Status: AC
  Administered 2016-10-01: 1000 mL via INTRAVENOUS

## 2016-10-01 MED ORDER — ONDANSETRON 4 MG PO TBDP
4.0000 mg | ORAL_TABLET | Freq: Four times a day (QID) | ORAL | Status: DC | PRN
  Administered 2016-10-01 – 2016-10-09 (×11): 4 mg via ORAL
  Filled 2016-10-01 (×19): qty 1

## 2016-10-01 MED ORDER — FENTANYL CITRATE (PF) 100 MCG/2ML IJ SOLN: 12.5000 ug | Freq: Four times a day (QID) | INTRAMUSCULAR | Status: DC | PRN

## 2016-10-01 MED ORDER — OXYCODONE HCL 5 MG PO TABS
10.0000 mg | ORAL_TABLET | Freq: Four times a day (QID) | ORAL | Status: DC | PRN
  Administered 2016-10-01 – 2016-10-14 (×34): 10 mg via ORAL
  Filled 2016-10-01 (×35): qty 2

## 2016-10-01 MED ORDER — WARFARIN SODIUM 7.5 MG PO TABS
7.5000 mg | ORAL_TABLET | Freq: Once | ORAL | Status: AC
  Administered 2016-10-01: 7.5 mg via ORAL
  Filled 2016-10-01: qty 1

## 2016-10-01 NOTE — Progress Notes (Signed)
ANTICOAGULATION CONSULT NOTE - Follow Up Consult  Pharmacy Consult for Heparin/Warfarin Indication: MVR  Allergies  Allergen Reactions  . Cyclobenzaprine Hcl Nausea And Vomiting  . Latex Other (See Comments)    Unknown    Patient Measurements: Height: 5\' 4"  (162.6 cm) Weight: 149 lb 1.6 oz (67.6 kg) IBW/kg (Calculated) : 54.7   Vital Signs: Temp: 98.4 F (36.9 C) (11/08 0400) Temp Source: Oral (11/08 0400) BP: 143/66 (11/08 0837) Pulse Rate: 92 (11/08 0837)  Labs:  Recent Labs  09/29/16 0358 09/30/16 0802 09/30/16 1929 10/01/16 0812  HGB 13.0 14.0  --  14.8  HCT 39.1 41.3  --  43.0  PLT 231 243  --  263  LABPROT 15.4* 18.1*  --  22.0*  INR 1.21 1.48  --  1.90  HEPARINUNFRC 0.63 0.77* 0.57 0.83*  CREATININE 0.70  --   --   --     Estimated Creatinine Clearance: 76 mL/min (by C-G formula based on SCr of 0.7 mg/dL).   Assessment: 55yof restarted on coumadin on 11/5 with heparin bridge for MVR. INR 8.06 on 11/3 - s/p vit k. Heparin level now supratherapeutic at 0.83 on 1300 units/h this AM. INR up 1.9 after 3 doses, conservative dosing for now with history of labile INR values. Also noted recently added amoxicillin/clarithroymycin - may affect INR. CBC stable. No bleeding or IV line issues per RN.  PTA warfarin dose: 10mg  daily exc for 7.5mg  on Mon/Thurs  Goal of Therapy:  Heparin level 0.3-0.7 units/ml  INR 2.5-3.5 per MD note Monitor platelets by anticoagulation protocol: Yes   Plan:  Decrease heparin to 1200 units/h Warfarin 7.5mg  x 1 dose tonight 6h heparin level Daily heparin level/INR/CBC Monitor for s/sx bleeding   Babs BertinHaley Avionna Bower, PharmD, BCPS Clinical Pharmacist 10/01/2016 9:08 AM

## 2016-10-01 NOTE — Progress Notes (Signed)
1 Day Post-Op  Subjective: She still complains of pain same place.  Says we gave her an orange elixir that made and it made her better.  She complains of pain   Objective: Vital signs in last 24 hours: Temp:  [97.8 F (36.6 C)-98.9 F (37.2 C)] 98.4 F (36.9 C) (11/08 0400) Pulse Rate:  [82-101] 92 (11/08 0837) Resp:  [16-32] 16 (11/08 0400) BP: (131-205)/(66-93) 143/66 (11/08 0837) SpO2:  [99 %-100 %] 99 % (11/08 0400) Weight:  [67.6 kg (149 lb 1.6 oz)] 67.6 kg (149 lb 1.6 oz) (11/08 0400) Last BM Date: 09/30/16 Weight is trending up  Nothing PO recorded - regular diet Voided x 1 recorded Afebrile, VSS Na down to 134, CO2 19, glucose is up, and WBC is up to 22 K INR up to 1.9  Fentanyl 150 mcg yesterday Fentanyl patch 25 mcg is also in place Zofran x 5 IV + phenergan 25 mg x 3 doses yesterday Oxycodone 10 mg x 1   Intake/Output from previous day: 11/07 0701 - 11/08 0700 In: 205 [I.V.:200] Out: -  Intake/Output this shift: Total I/O In: 3 [I.V.:3] Out: -   General appearance: alert, cooperative, no distress and sleeping when I went in Resp: clear to auscultation bilaterally GI: soft, non tender, + BS, continues to complain of pain same place.  Mid epigastric area, exam is normal otherwise. Extremities: extremities normal, atraumatic, no cyanosis or edema  Lab Results:   Recent Labs  09/30/16 0802 10/01/16 0812  WBC 14.2* 22.0*  HGB 14.0 14.8  HCT 41.3 43.0  PLT 243 263    BMET  Recent Labs  09/29/16 0358 10/01/16 0812  NA 137 134*  K 3.5 3.7  CL 106 102  CO2 22 19*  GLUCOSE 97 151*  BUN 6 7  CREATININE 0.70 0.96  CALCIUM 8.6* 9.3   PT/INR  Recent Labs  09/30/16 0802 10/01/16 0812  LABPROT 18.1* 22.0*  INR 1.48 1.90     Recent Labs Lab 09/24/16 1201 09/25/16 1146 09/26/16 1427 09/26/16 1955  AST 38 53* 36 34  ALT 27 25 21 21   ALKPHOS 96 107 66 65  BILITOT 0.9 1.2 1.4* 1.0  PROT 8.3* 7.9 6.3* 6.5  ALBUMIN 3.9 3.7 2.6* 2.5*      Lipase  No results found for: LIPASE   Studies/Results: No results found.  Medications: . acetaminophen  1,000 mg Oral TID  . amoxicillin  500 mg Oral Q12H  . carvedilol  25 mg Oral BID WC  . clarithromycin  500 mg Oral Q12H  . dextrose  50 mL Intravenous Once  . enalapril  5 mg Oral BID  . feeding supplement  1 Container Oral TID BM  . feeding supplement (PRO-STAT SUGAR FREE 64)  30 mL Oral BID  . fentaNYL  25 mcg Transdermal Q72H  . insulin aspart  0-9 Units Subcutaneous Q4H  . lip balm   Topical BID  . ondansetron (ZOFRAN) IV  4 mg Intravenous Q6H  . pantoprazole  40 mg Oral BID  . saccharomyces boulardii  250 mg Oral BID  . sodium chloride flush  3 mL Intravenous Q12H  . sucralfate  1 g Oral TID WC & HS  . warfarin  7.5 mg Oral ONCE-1800  . Warfarin - Pharmacist Dosing Inpatient   Does not apply q1800   . heparin 1,200 Units/hr (10/01/16 0916)  . lactated ringers 10 mL/hr at 09/30/16 0955    Assessment/Plan Abdominal pain, Nausea, probably secondary to some  subhepatic fluid collection S/p laparoscopic cholecystectomy10/16/17, Dr. 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    New leukocytosis- improved; but now trending up  Again to 22K  Supra-theraputic INR Had gottenup to 8.06 - INR 1.9 today  Dehydration -continuing IV hydration, resolved, DC  IVF  Acute renal insuffiencey- Cr normal with hydration   FEN:adv to regular diet/ Add nutrition shakes. Hypokalemia - replace potassium today  ID:  7 days of Zosyn 10/13-10/19/17 ciprofloxacin day 3 09/17/16, then discontinued. C diff negative 09/17/16 - No growth on Blood or fluid cultures no growth on GB fossa fluid. Appreciated ID input. No fever. Wbc decreasing 21 to 13 to 12  H Pylori - Amoxicillin 1 gm BID, Clarithromycin 500 mg BID start 09/30/16 day 2  WUJ:WJXBJYNVTE:heparin per pharmacy, started coumadin. INR 1.9  Plan:  Discussed the lack of I/O with nursing,they do not know what the "orange elixir" is.  She ask for something for diarrhea earlier,but no one has seen her have diarrhea.  Decreasing Fentanyl, stopping phenergan, making Zofran PRN and PO.  Giving her extra fluid, calorie count, increasing her ambulation.  Check UA, and recheck labs in AM.     LOS: 16 days    Deland Slocumb 10/01/2016 [phone]

## 2016-10-01 NOTE — Progress Notes (Signed)
ANTICOAGULATION CONSULT NOTE - Follow Up Consult  Pharmacy Consult for Heparin/Warfarin Indication: MVR  Allergies  Allergen Reactions  . Cyclobenzaprine Hcl Nausea And Vomiting  . Latex Other (See Comments)    Unknown    Patient Measurements: Height: 5\' 4"  (162.6 cm) Weight: 149 lb 1.6 oz (67.6 kg) IBW/kg (Calculated) : 54.7   Vital Signs: Temp: 98.8 F (37.1 C) (11/08 1353) Temp Source: Oral (11/08 1353) BP: 153/82 (11/08 1353) Pulse Rate: 86 (11/08 1353)  Labs:  Recent Labs  09/29/16 0358 09/30/16 0802 09/30/16 1929 10/01/16 0812 10/01/16 1206 10/01/16 1603  HGB 13.0 14.0  --  14.8  --   --   HCT 39.1 41.3  --  43.0  --   --   PLT 231 243  --  263  --   --   LABPROT 15.4* 18.1*  --  22.0*  --   --   INR 1.21 1.48  --  1.90  --   --   HEPARINUNFRC 0.63 0.77* 0.57 0.83*  --  0.79*  CREATININE 0.70  --   --  0.96 0.79  --     Estimated Creatinine Clearance: 76 mL/min (by C-G formula based on SCr of 0.79 mg/dL).   Assessment: 55yof restarted on coumadin on 11/5 with heparin bridge for MVR. INR 8.06 on 11/3 - s/p vit k. Heparin level again SUPRAtherapeutic at 0.79 after reduction to 1200 units/hr. No bleeding or IV line issues per RN.  PTA warfarin dose: 10mg  daily exc for 7.5mg  on Mon/Thurs  Goal of Therapy:  Heparin level 0.3-0.7 units/ml  INR 2.5-3.5 per MD note Monitor platelets by anticoagulation protocol: Yes   Plan:  Decrease heparin to 1100 units/h 6h heparin level Daily heparin level/INR/CBC Monitor for s/sx bleeding   Sherle Poeob Vincent, PharmD Clinical Pharmacist 5:06 PM, 10/01/2016

## 2016-10-02 ENCOUNTER — Encounter (HOSPITAL_COMMUNITY): Payer: Self-pay | Admitting: Gastroenterology

## 2016-10-02 LAB — HEPARIN LEVEL (UNFRACTIONATED): Heparin Unfractionated: 0.43 IU/mL (ref 0.30–0.70)

## 2016-10-02 LAB — PROTIME-INR
INR: 2.37
Prothrombin Time: 26.4 seconds — ABNORMAL HIGH (ref 11.4–15.2)

## 2016-10-02 LAB — GLUCOSE, CAPILLARY
Glucose-Capillary: 107 mg/dL — ABNORMAL HIGH (ref 65–99)
Glucose-Capillary: 107 mg/dL — ABNORMAL HIGH (ref 65–99)
Glucose-Capillary: 115 mg/dL — ABNORMAL HIGH (ref 65–99)

## 2016-10-02 LAB — CBC
HCT: 37 % (ref 36.0–46.0)
HEMOGLOBIN: 12.7 g/dL (ref 12.0–15.0)
MCH: 31.8 pg (ref 26.0–34.0)
MCHC: 34.3 g/dL (ref 30.0–36.0)
MCV: 92.7 fL (ref 78.0–100.0)
Platelets: 245 10*3/uL (ref 150–400)
RBC: 3.99 MIL/uL (ref 3.87–5.11)
RDW: 13.5 % (ref 11.5–15.5)
WBC: 14.1 10*3/uL — ABNORMAL HIGH (ref 4.0–10.5)

## 2016-10-02 LAB — TISSUE TRANSGLUTAMINASE, IGA: Tissue Transglutaminase Ab, IgA: <2 U/mL (ref 0–3)

## 2016-10-02 LAB — PREALBUMIN: PREALBUMIN: 13 mg/dL — AB (ref 18–38)

## 2016-10-02 MED ORDER — FENTANYL CITRATE (PF) 100 MCG/2ML IJ SOLN
12.5000 ug | Freq: Three times a day (TID) | INTRAMUSCULAR | Status: DC | PRN
  Administered 2016-10-08 – 2016-10-09 (×3): 12.5 ug via INTRAVENOUS
  Filled 2016-10-02 (×4): qty 2

## 2016-10-02 MED ORDER — PRO-STAT SUGAR FREE PO LIQD
30.0000 mL | Freq: Three times a day (TID) | ORAL | Status: DC
  Administered 2016-10-02 – 2016-10-14 (×18): 30 mL via ORAL
  Filled 2016-10-02 (×21): qty 30

## 2016-10-02 MED ORDER — WARFARIN SODIUM 7.5 MG PO TABS
7.5000 mg | ORAL_TABLET | Freq: Once | ORAL | Status: AC
  Administered 2016-10-02: 7.5 mg via ORAL
  Filled 2016-10-02: qty 1

## 2016-10-02 NOTE — Progress Notes (Signed)
Nutrition Follow-up  DOCUMENTATION CODES:   Not applicable  INTERVENTION:  48 hour calorie count initiated.   Continue Boost Breeze po TID, each supplement provides 250 kcal and 9 grams of protein.  Provide 30 ml Prostat po TID, each supplement provides 100 kcal and 15 grams of protein.   Encourage adequate PO intake.   NUTRITION DIAGNOSIS:   Inadequate oral intake related to poor appetite as evidenced by meal completion < 50%; improving  GOAL:   Patient will meet greater than or equal to 90% of their needs; progressing  MONITOR:   PO intake, Supplement acceptance, Labs, Weight trends, Skin, I & O's  REASON FOR ASSESSMENT:   Malnutrition Screening Tool    ASSESSMENT:   55 y.o. 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 Ezzard StandingNewman, discharged on 10/20.  Readmitted from ED on 10/23 for [facility] CT [facility], indicating possible post-op complications of bile leak vs infection.  CT abd on 10/27 showed fluid/gas collection in GB fossa indicating possible infection. Percutaneous drain placed in subhepatic gallbladder fossa fluid collection 10/29. Drains removed 11/6.  RD consulted for calorie count. No recent meal completion recorded, however pt reports consuming oatmeal this AM. She reports appetite is improving and abdominal pains has been getting better. Pt currently has Boost Breeze and Prostat ordered and has been consuming them. RD to increase Prostat to aid in adequate protein needs. Pt encouraged to eat her food at meals. RD to follow up tomorrow for day 1 calorie count results.   Labs and medications reviewed.   Diet Order:  Diet regular Room service appropriate? Yes; Fluid consistency: Thin  Skin:   (Incision on abdomen)  Last BM:   11/8  Height:   Ht Readings from Last 1 Encounters:  09/30/16 5\' 4"  (1.626 m)    Weight:   Wt Readings from Last 1 Encounters:  10/02/16 144 lb 14.4 oz (65.7 kg)    Ideal Body Weight:  54.5 kg  BMI:  Body mass index is 24.87 kg/m.  Estimated Nutritional Needs:   Kcal:  1800-1900  Protein:  75-85 grams  Fluid:  1.8 - 1.9 L/day  EDUCATION NEEDS:   No education needs identified at this time  Roslyn SmilingStephanie Tremon Sainvil, MS, RD, LDN Pager # [phone] After hours/ weekend pager # [phone]

## 2016-10-02 NOTE — Progress Notes (Signed)
2 Days Post-Op  Subjective: Fentanyl patch 25 mcg No IV fentanyl recorded supplements recorded Oxycodone 30 mg po recorded yesterday She reports she was sick yesterday but started feeling better last PM.  PO intake is hard to gauge, she reports little and nothing is recorded.  No complaints of diarrhea this AM.    Objective: Vital signs in last 24 hours: Temp:  [98.1 F (36.7 C)-98.8 F (37.1 C)] 98.3 F (36.8 C) (11/09 0300) Pulse Rate:  [81-86] 81 (11/09 0300) Resp:  [16] 16 (11/09 0300) BP: (125-153)/(55-82) 127/59 (11/09 0300) SpO2:  [97 %-98 %] 97 % (11/09 0300) Weight:  [65.7 kg (144 lb 14.4 oz)] 65.7 kg (144 lb 14.4 oz) (11/09 0300) Last BM Date: 10/01/16   PO not recorded IV 600 Urine 500 recorded NO BM recorded Afebrile, VSS WBC 14.1 Prealbumin 13 INR up to 2.37 Urine shows some blood, some yeast   Intake/Output from previous day: 11/08 0701 - 11/09 0700 In: 614.3 [I.V.:72.5; IV Piggyback:541.8] Out: 500 [Urine:500] Intake/Output this shift: No intake/output data recorded.  General appearance: alert, cooperative and no distress Resp: clear to auscultation bilaterally GI: soft, non-tender; bowel sounds normal; no masses,  no organomegaly  Lab Results:   Recent Labs  10/01/16 0812 10/02/16 0538  WBC 22.0* 14.1*  HGB 14.8 12.7  HCT 43.0 37.0  PLT 263 245    BMET  Recent Labs  10/01/16 0812 10/01/16 1206  NA 134* 132*  K 3.7 3.4*  CL 102 102  CO2 19* 19*  GLUCOSE 151* 146*  BUN 7 10  CREATININE 0.96 0.79  CALCIUM 9.3 8.8*   PT/INR  Recent Labs  10/01/16 0812 10/02/16 0538  LABPROT 22.0* 26.4*  INR 1.90 2.37     Recent Labs Lab 09/26/16 1427 09/26/16 1955 10/01/16 1206  AST 36 34 38  ALT 21 21 28   ALKPHOS 66 65 92  BILITOT 1.4* 1.0 0.8  PROT 6.3* 6.5 6.7  ALBUMIN 2.6* 2.5* 2.9*     Lipase  No results found for: LIPASE   Studies/Results: No results found. Prior to Admission medications   Medication Sig Start Date  End Date Taking? Authorizing Provider  carvedilol (COREG) 6.25 MG tablet Take 1 tablet (6.25 mg total) by mouth 2 (two) times daily with a meal. 09/12/16  Yes Brooke Levora DredgeM Ghimire, MD  HYDROcodone-acetaminophen (NORCO/VICODIN) 5-325 MG tablet Take 1 tablet by mouth 3 (three) times daily as needed for moderate pain. 09/12/16  Yes Brooke Levora DredgeM Ghimire, MD  Melatonin 3 MG CAPS Take 3 mg by mouth at bedtime as needed (for sleep).   Yes Historical Provider, MD  promethazine (PHENERGAN) 25 MG tablet Take 25 mg by mouth 2 (two) times daily as needed for nausea or vomiting.  02/26/12  Yes Historical Provider, MD  warfarin (COUMADIN) 5 MG tablet Take 2.5 mg once on 10/21 Start taking your usual regimen on 10/22 (7.5 mg by mouth on Monday and Thursday. Take 10mg  by mouth on all other days) Go to Dr [name]'s office on 10/23 for INR check-and ask Dr [name] for further dosing instructions 09/12/16  Yes Brooke Levora DredgeM Ghimire, MD  NITROSTAT 0.4 MG SL tablet DISSOLVE ONE TABLET UNDER TONGUE EVERY 5 MINUTES UP TO 3 DOSES AS NEEDED FOR CHEST PAIN Patient taking differently: DISSOLVE 0.4 MG UNDER TONGUE EVERY 5 MINUTES UP TO 3 DOSES AS NEEDED FOR CHEST PAIN 03/22/15   Brooke SidleSamuel G McDowell, MD    Medications: . acetaminophen  1,000 mg Oral TID  . amoxicillin  500 mg Oral Q12H  . carvedilol  25 mg Oral BID WC  . clarithromycin  500 mg Oral Q12H  . dextrose  50 mL Intravenous Once  . enalapril  5 mg Oral BID  . feeding supplement  1 Container Oral TID BM  . feeding supplement (PRO-STAT SUGAR FREE 64)  30 mL Oral BID  . fentaNYL  25 mcg Transdermal Q72H  . insulin aspart  0-9 Units Subcutaneous Q4H  . lip balm   Topical BID  . pantoprazole  40 mg Oral BID  . saccharomyces boulardii  250 mg Oral BID  . sodium chloride flush  3 mL Intravenous Q12H  . sucralfate  1 g Oral TID WC & HS  . warfarin  7.5 mg Oral ONCE-1800  . Warfarin - Pharmacist Dosing Inpatient   Does not apply q1800    Assessment/Plan Abdominal pain, Nausea,  probably secondary to some subhepatic fluid collection S/p laparoscopic cholecystectomy10/16/17, Dr. 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    New leukocytosis- improved; but now trending up  Again to 22K  Supra-theraputic INR Had gottenup to 8.06 - INR 2.37 today  Dehydration -continuing IV hydration, resolved, DC IVF  Acute renal insuffiencey- Cr normal with hydration   FEN:adv to regular diet/ Add nutrition shakes. Hypokalemia - replace potassium today  ID:  7 days of Zosyn 10/13-10/19/17 ciprofloxacin day 3 09/17/16, then discontinued. C diff negative 09/17/16 - No growth on Blood or fluid cultures no growth on GB fossa fluid. Appreciated ID input. No fever. Wbc decreasing 21 to 13 to 12 Urine cultures negative x 2  H Pylori - Amoxicillin 1 gm BID, Clarithromycin 500 mg BIDstart 09/30/16 day 3  VWU:JWJXBJYVTE:heparin per pharmacy, started coumadin. INR 1.9     Plan:  Decrease possible Fentanyl even more, stop Fentanyl patch, continue oxycodone.   If her INR is therapeutic and she is doing well aim for discharge home tomorrow. Stopping CBG's and SS insulin.  Continue Rx for esophagitis and duodenitis. I encouraged her to continue eating well.      LOS: 17 days    [name] 10/02/2016 [phone]

## 2016-10-02 NOTE — Progress Notes (Signed)
ANTICOAGULATION CONSULT NOTE - Follow Up Consult  Pharmacy Consult for Heparin/Warfarin Indication: MVR  Allergies  Allergen Reactions  . Cyclobenzaprine Hcl Nausea And Vomiting  . Latex Other (See Comments)    Unknown    Patient Measurements: Height: 5\' 4"  (162.6 cm) Weight: 144 lb 14.4 oz (65.7 kg) IBW/kg (Calculated) : 54.7   Vital Signs: Temp: 98.3 F (36.8 C) (11/09 0300) Temp Source: Oral (11/09 0300) BP: 127/59 (11/09 0300) Pulse Rate: 81 (11/09 0300)  Labs:  Recent Labs  09/30/16 0802  10/01/16 0812 10/01/16 1206 10/01/16 1603 10/01/16 2311 10/02/16 0538  HGB 14.0  --  14.8  --   --   --  12.7  HCT 41.3  --  43.0  --   --   --  37.0  PLT 243  --  263  --   --   --  245  LABPROT 18.1*  --  22.0*  --   --   --  26.4*  INR 1.48  --  1.90  --   --   --  2.37  HEPARINUNFRC 0.77*  < > 0.83*  --  0.79* 0.39 0.43  CREATININE  --   --  0.96 0.79  --   --   --   < > = values in this interval not displayed.  Estimated Creatinine Clearance: 75 mL/min (by C-G formula based on SCr of 0.79 mg/dL).   Assessment: 55yof restarted on coumadin on 11/5 with heparin bridge for MVR. INR 8.06 on 11/3 - s/p vit k. Heparin level remains therapeutic at 0.43 on 1100 units/h. INR up 2.37 after 4 doses, conservative dosing for now with history of labile INR values. Also noted recently added amoxicillin/clarithromycin - may affect INR. CBC stable. No bleeding documented.  PTA warfarin dose: 10mg  daily exc for 7.5mg  on Mon/Thurs  Goal of Therapy:  Heparin level 0.3-0.7 units/ml  INR 2.5-3.5 per MD note Monitor platelets by anticoagulation protocol: Yes   Plan:  Heparin at 1100 units/h - d/c when INR therapeutic Warfarin 7.5mg  x 1 dose tonight Daily heparin level/INR/CBC Monitor for s/sx bleeding   Babs BertinHaley Hutchinson Isenberg, PharmD, BCPS Clinical Pharmacist 10/02/2016 9:10 AM

## 2016-10-02 NOTE — Progress Notes (Signed)
ANTICOAGULATION CONSULT NOTE - Follow Up Consult  Pharmacy Consult for heparin Indication: MVR  Labs:  Recent Labs  09/29/16 0358 09/30/16 0802  10/01/16 0812 10/01/16 1206 10/01/16 1603 10/01/16 2311  HGB 13.0 14.0  --  14.8  --   --   --   HCT 39.1 41.3  --  43.0  --   --   --   PLT 231 243  --  263  --   --   --   LABPROT 15.4* 18.1*  --  22.0*  --   --   --   INR 1.21 1.48  --  1.90  --   --   --   HEPARINUNFRC 0.63 0.77*  < > 0.83*  --  0.79* 0.39  CREATININE 0.70  --   --  0.96 0.79  --   --   < > = values in this interval not displayed.   Assessment/Plan:  55yo female therapeutic on heparin after rate change. Will continue gtt at current rate and confirm stable with am labs.   Vernard GamblesVeronda Tag Wurtz, PharmD, BCPS  10/02/2016,12:00 AM

## 2016-10-03 LAB — CBC
HCT: 35.5 % — ABNORMAL LOW (ref 36.0–46.0)
Hemoglobin: 12.2 g/dL (ref 12.0–15.0)
MCH: 31.9 pg (ref 26.0–34.0)
MCHC: 34.4 g/dL (ref 30.0–36.0)
MCV: 92.7 fL (ref 78.0–100.0)
Platelets: 270 10*3/uL (ref 150–400)
RBC: 3.83 MIL/uL — ABNORMAL LOW (ref 3.87–5.11)
RDW: 13.4 % (ref 11.5–15.5)
WBC: 13.3 10*3/uL — ABNORMAL HIGH (ref 4.0–10.5)

## 2016-10-03 LAB — PROTIME-INR
INR: 2.47
PROTHROMBIN TIME: 27.2 s — AB (ref 11.4–15.2)

## 2016-10-03 LAB — HEPARIN LEVEL (UNFRACTIONATED): HEPARIN UNFRACTIONATED: 0.5 [IU]/mL (ref 0.30–0.70)

## 2016-10-03 MED ORDER — WARFARIN SODIUM 7.5 MG PO TABS
7.5000 mg | ORAL_TABLET | Freq: Once | ORAL | Status: AC
  Administered 2016-10-03: 7.5 mg via ORAL
  Filled 2016-10-03: qty 1

## 2016-10-03 NOTE — Progress Notes (Signed)
Nutrition Follow-up  DOCUMENTATION CODES:   Not applicable  INTERVENTION:  Continue 48 hour calorie count.  Continue Boost Breeze po TID, each supplement provides 250 kcal and 9 grams of protein.  Continue 30 ml Prostat po TID, each supplement provides 100 kcal and 15 grams of protein.   Encourage adequate PO intake.   NUTRITION DIAGNOSIS:   Inadequate oral intake related to poor appetite as evidenced by meal completion < 50%; ongoing  GOAL:   Patient will meet greater than or equal to 90% of their needs; progressing  MONITOR:   PO intake, Supplement acceptance, Labs, Weight trends, Skin, I & O's  REASON FOR ASSESSMENT:   Malnutrition Screening Tool    ASSESSMENT:   55 y.o. 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 Ezzard StandingNewman, discharged on 10/20.  Readmitted from ED on 10/23 for [facility] CT [facility], indicating possible post-op complications of bile leak vs infection.  CT abd on 10/27 showed fluid/gas collection in GB fossa indicating possible infection. Percutaneous drain placed in subhepatic gallbladder fossa fluid collection 10/29.  RD attempted to retrieve results from day 1 calorie count, however no information given on meal tickets. RD unable to determine nutritional/po intake over the past 24 hours. No recent meal completion recorded. Pt does report abdominal pains which started last night and have not improved. Pt reports no po intake this AM. Pt however has been consuming her Boost Breeze and Prostat supplements. RD to continue with current orders. Pt encouraged to eat her food at meals and to drink her supplements. Supplements to provide 1050 kcal (58% of kcal needs) and 72 grams of protein (96% of protein needs) if consumption 100%. RD to  follow up with full calorie count results Monday 11/13.   Diet Order:  Diet regular Room service appropriate? Yes; Fluid consistency: Thin  Skin:   (Incision on abdomen)  Last BM:  11/10  Height:   Ht Readings from Last 1 Encounters:  09/30/16 5\' 4"  (1.626 m)    Weight:   Wt Readings from Last 1 Encounters:  10/03/16 147 lb 3.2 oz (66.8 kg)    Ideal Body Weight:  54.5 kg  BMI:  Body mass index is 25.27 kg/m.  Estimated Nutritional Needs:   Kcal:  1800-1900  Protein:  75-85 grams  Fluid:  1.8 - 1.9 L/day  EDUCATION NEEDS:   No education needs identified at this time  Roslyn SmilingStephanie Shaheen Mende, MS, RD, LDN Pager # [phone] After hours/ weekend pager # [phone]

## 2016-10-03 NOTE — Progress Notes (Signed)
3 Days Post-Op  Subjective: Complaining of nausea this AM, not wanting meds.  Exam as usual, is unremarkable.  INR is not up to 2.5 yet.  Objective: Vital signs in last 24 hours: Temp:  [97.8 F (36.6 C)-99.5 F (37.5 C)] 98.8 F (37.1 C) (11/10 0420) Pulse Rate:  [79-83] 83 (11/10 0420) Resp:  [16] 16 (11/10 0420) BP: (92-134)/(50-65) 134/65 (11/10 0908) SpO2:  [97 %-100 %] 97 % (11/10 0420) Weight:  [66.8 kg (147 lb 3.2 oz)] 66.8 kg (147 lb 3.2 oz) (11/10 0557) Last BM Date: 10/03/16 Nothing in I/O Afebrile, VSS WBC improving INR 2.47 Tissue Transglutaminase Ab, IgA (looking for Celiac dz) is <2  - negative No films Meds yesterday Tylenol 2 gm Xanax 0.5 HS NO fentanyl Zofran x 1 Oxy IR 20 mg    Intake/Output from previous day: No intake/output data recorded. Intake/Output this shift: No intake/output data recorded.  General appearance: alert, cooperative and no distress Resp: clear to auscultation bilaterally GI: soft, non-tender; bowel sounds normal; no masses,  no organomegaly  Lab Results:   Recent Labs  10/02/16 0538 10/03/16 0205  WBC 14.1* 13.3*  HGB 12.7 12.2  HCT 37.0 35.5*  PLT 245 270    BMET  Recent Labs  10/01/16 0812 10/01/16 1206  NA 134* 132*  K 3.7 3.4*  CL 102 102  CO2 19* 19*  GLUCOSE 151* 146*  BUN 7 10  CREATININE 0.96 0.79  CALCIUM 9.3 8.8*   PT/INR  Recent Labs  10/02/16 0538 10/03/16 0205  LABPROT 26.4* 27.2*  INR 2.37 2.47     Recent Labs Lab 09/26/16 1427 09/26/16 1955 10/01/16 1206  AST 36 34 38  ALT 21 21 28   ALKPHOS 66 65 92  BILITOT 1.4* 1.0 0.8  PROT 6.3* 6.5 6.7  ALBUMIN 2.6* 2.5* 2.9*     Lipase  No results found for: LIPASE   Studies/Results: No results found.  Medications: . acetaminophen  1,000 mg Oral TID  . amoxicillin  500 mg Oral Q12H  . carvedilol  25 mg Oral BID WC  . clarithromycin  500 mg Oral Q12H  . dextrose  50 mL Intravenous Once  . enalapril  5 mg Oral BID  .  feeding supplement  1 Container Oral TID BM  . feeding supplement (PRO-STAT SUGAR FREE 64)  30 mL Oral TID  . lip balm   Topical BID  . pantoprazole  40 mg Oral BID  . saccharomyces boulardii  250 mg Oral BID  . sodium chloride flush  3 mL Intravenous Q12H  . sucralfate  1 g Oral TID WC & HS  . warfarin  7.5 mg Oral ONCE-1800  . Warfarin - Pharmacist Dosing Inpatient   Does not apply q1800   . heparin 1,100 Units/hr (10/02/16 1756)  . lactated ringers 10 mL/hr at 09/30/16 [id number]    Assessment/Plan Abdominal pain, Nausea, probably secondary to some subhepatic fluid collection S/p laparoscopic cholecystectomy10/16/17, Dr. 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   New leukocytosis- improved; but now trending up Again to 22K  Supra-theraputic INR Had gottenup to 8.06 - INR 2.37 today  Dehydration -continuing IV hydration, resolved, DC IVF  Acute renal insuffiencey- Cr normal with hydration   FEN:adv to regular diet/Add nutrition shakes. Hypokalemia - replace potassium today  ID: 7 days of Zosyn 10/13-10/19/17 ciprofloxacin day 3 09/17/16, then discontinued. C diff negative 09/17/16 - No growth on Blood or fluid cultures no growth on GB fossa fluid. Appreciated ID input. No fever. Wbc decreasing 21 to 13 to 12 Urine cultures  negative x 2  H Pylori - Amoxicillin 1 gm BID, Clarithromycin 500 mg BIDstart 09/30/16 day 4/14  ZOX:WRUEAVWVTE:heparin per pharmacy, started coumadin. INR 2.37     Plan:  No I/O so I don't know where we are there.  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  She remains in the dark and PO intake is rather poor.  I have encouraged her to walk more, eat more and take her meds. Home with no more than 30 tablets oxycodone IR 10 mg.  She reports they are working on pain clinic follow up thru her PCP.     LOS: 18 days    [name] 10/03/2016 [phone]

## 2016-10-03 NOTE — Progress Notes (Signed)
[facility] Progress Note  Subjective: Patient asleep and I did not arouse her.  Objective: Vital signs in last 24 hours: Temp:  [97.8 F (36.6 C)-99.5 F (37.5 C)] 98.8 F (37.1 C) (11/10 0420) Pulse Rate:  [79-83] 83 (11/10 0420) Resp:  [16] 16 (11/10 0420) BP: (92-113)/(50-60) 92/50 (11/10 0420) SpO2:  [97 %-100 %] 97 % (11/10 0420) Weight:  [66.8 kg (147 lb 3.2 oz)] 66.8 kg (147 lb 3.2 oz) (11/10 0557) Weight change: 1.043 kg (2 lb 4.8 oz)   PE: Deferred  Lab Results: Results for orders placed or performed during the hospital encounter of 09/15/16 (from the past 24 hour(s))  Protime-INR     Status: Abnormal   Collection Time: 10/03/16  2:05 AM  Result Value Ref Range   Prothrombin Time 27.2 (H) 11.4 - 15.2 seconds   INR 2.47   Heparin level (unfractionated)     Status: None   Collection Time: 10/03/16  2:05 AM  Result Value Ref Range   Heparin Unfractionated 0.50 0.30 - 0.70 IU/mL  CBC     Status: Abnormal   Collection Time: 10/03/16  2:05 AM  Result Value Ref Range   WBC 13.3 (H) 4.0 - 10.5 K/uL   RBC 3.83 (L) 3.87 - 5.11 MIL/uL   Hemoglobin 12.2 12.0 - 15.0 g/dL   HCT 16.135.5 (L) 09.636.0 - 04.546.0 %   MCV 92.7 78.0 - 100.0 fL   MCH 31.9 26.0 - 34.0 pg   MCHC 34.4 30.0 - 36.0 g/dL   RDW 40.913.4 81.111.5 - 91.415.5 %   Platelets 270 150 - 400 K/uL    Studies/Results: No results found.    Assessment: Abdominal pain status post cholecystectomy with gallbladder fossa abscess. Esophagitis and duodenitis on EGD H. pylori antibody positive  Plan: Triple therapy for H. pylori begun Tissue transglutaminase to rule out celiac pending. Will sign off for now. If TTG positive, please call us back and we will follow for celiac disease.    Lilton Pare C 10/03/2016, 8:21 AM  Pager [phone] If no answer or after 5 PM call [phone]

## 2016-10-03 NOTE — Progress Notes (Signed)
ANTICOAGULATION CONSULT NOTE - Follow Up Consult  Pharmacy Consult for Heparin/Warfarin Indication: MVR  Allergies  Allergen Reactions  . Cyclobenzaprine Hcl Nausea And Vomiting  . Latex Other (See Comments)    Unknown    Patient Measurements: Height: 5\' 4"  (162.6 cm) Weight: 147 lb 3.2 oz (66.8 kg) IBW/kg (Calculated) : 54.7   Vital Signs: Temp: 98.8 F (37.1 C) (11/10 0420) Temp Source: Oral (11/10 0420) BP: 134/65 (11/10 0908) Pulse Rate: 83 (11/10 0420)  Labs:  Recent Labs  10/01/16 0812 10/01/16 1206  10/01/16 2311 10/02/16 0538 10/03/16 0205  HGB 14.8  --   --   --  12.7 12.2  HCT 43.0  --   --   --  37.0 35.5*  PLT 263  --   --   --  245 270  LABPROT 22.0*  --   --   --  26.4* 27.2*  INR 1.90  --   --   --  2.37 2.47  HEPARINUNFRC 0.83*  --   < > 0.39 0.43 0.50  CREATININE 0.96 0.79  --   --   --   --   < > = values in this interval not displayed.  Estimated Creatinine Clearance: 75.5 mL/min (by C-G formula based on SCr of 0.79 mg/dL).   Assessment: 55yof restarted on coumadin on 11/5 with heparin bridge for MVR. INR 8.06 on 11/3 - s/p vit k. Heparin level remains therapeutic at 0.5 on 1100 units/hr. INR up 2.47 but just below goal, conservative dosing for now with history of labile INR values. Also noted recently added amoxicillin/clarithromycin for H pylori - clarithromycin has major interaction with warfarin and will likely affect INR. No bleeding documented, CBC stable.  PTA warfarin dose: 10mg  daily exc for 7.5mg  on Mon/Thurs  Goal of Therapy:  Heparin level 0.3-0.7 units/ml  INR 2.5-3.5 per MD note Monitor platelets by anticoagulation protocol: Yes   Plan:  Heparin at 1100 units/hr - d/c when INR therapeutic Warfarin 7.5mg  x 1 dose tonight Daily heparin level/INR/CBC Monitor for s/sx bleeding Needs close outpatient INR monitoring while on clarithromycin    Loura BackJennifer Strawberry, PharmD, BCPS Clinical Pharmacist Phone for today [phone] Main  pharmacy - [phone] 10/03/2016 10:23 AM

## 2016-10-04 LAB — CBC
HEMATOCRIT: 37.3 % (ref 36.0–46.0)
HEMOGLOBIN: 12.9 g/dL (ref 12.0–15.0)
MCH: 31.9 pg (ref 26.0–34.0)
MCHC: 34.6 g/dL (ref 30.0–36.0)
MCV: 92.1 fL (ref 78.0–100.0)
Platelets: 296 10*3/uL (ref 150–400)
RBC: 4.05 MIL/uL (ref 3.87–5.11)
RDW: 13.6 % (ref 11.5–15.5)
WBC: 18.1 10*3/uL — ABNORMAL HIGH (ref 4.0–10.5)

## 2016-10-04 LAB — H. PYLORI ANTIBODY, IGG: H PYLORI IGG: 2.8 U/mL — AB (ref 0.0–0.8)

## 2016-10-04 LAB — HEPARIN LEVEL (UNFRACTIONATED): HEPARIN UNFRACTIONATED: 0.5 [IU]/mL (ref 0.30–0.70)

## 2016-10-04 LAB — PROTIME-INR
INR: 2.95
Prothrombin Time: 31.4 seconds — ABNORMAL HIGH (ref 11.4–15.2)

## 2016-10-04 MED ORDER — WARFARIN SODIUM 7.5 MG PO TABS
7.5000 mg | ORAL_TABLET | Freq: Once | ORAL | Status: AC
  Administered 2016-10-04: 7.5 mg via ORAL
  Filled 2016-10-04: qty 1

## 2016-10-04 NOTE — Progress Notes (Signed)
ANTICOAGULATION CONSULT NOTE - Follow Up Consult  Pharmacy Consult for Heparin/Warfarin Indication: MVR  Allergies  Allergen Reactions  . Cyclobenzaprine Hcl Nausea And Vomiting  . Latex Other (See Comments)    Unknown   Patient Measurements: Height: 5\' 4"  (162.6 cm) Weight: 132 lb 6.4 oz (60.1 kg) IBW/kg (Calculated) : 54.7  Vital Signs: Temp: 98 F (36.7 C) (11/11 0447) Temp Source: Oral (11/11 0447) BP: 118/47 (11/11 0447) Pulse Rate: 67 (11/11 0447)  Labs:  Recent Labs  10/01/16 0812 10/01/16 1206  10/02/16 0538 10/03/16 0205 10/04/16 0459  HGB 14.8  --   --  12.7 12.2 12.9  HCT 43.0  --   --  37.0 35.5* 37.3  PLT 263  --   --  245 270 296  LABPROT 22.0*  --   --  26.4* 27.2* 31.4*  INR 1.90  --   --  2.37 2.47 2.95  HEPARINUNFRC 0.83*  --   < > 0.43 0.50 0.50  CREATININE 0.96 0.79  --   --   --   --   < > = values in this interval not displayed.  Estimated Creatinine Clearance: 69.4 mL/min (by C-G formula based on SCr of 0.79 mg/dL).  Assessment: 55y on Warfarin with Heparin overlap.  INR today is 2.9 and within desired range.  Her CBC indicates a stable H/H and no noted bleeding complications.  Heparin level this morning remains at 0.5 on 1100 units/hr.  The plan is to discontinue IV heparin with a therapeutic INR.  We have used conservative dosing for now with history of labile INR values. Also noted recently added amoxicillin/clarithromycin for H pylori - clarithromycin has major interaction with warfarin and will likely affect INR. She has had 5 days of therapy now with 7.5mg , this may need to be her maintenance dose while she is taking Clarithromycin.  PTA warfarin dose: 10mg  daily exc for 7.5mg  on Mon/Thurs  Goal of Therapy:   INR 2.5-3.5 per MD note Monitor platelets by anticoagulation protocol: Yes   Plan:  Warfarin 7.5mg  x 1 dose tonight Monitor for s/sx bleeding Needs close outpatient INR monitoring while on clarithromycin   Nadara MustardNita Neve Branscomb,  PharmD., MS Clinical Pharmacist Pager:  [phone] Thank you for allowing pharmacy to be part of this patients care team. 10/04/2016 7:21 AM

## 2016-10-04 NOTE — Progress Notes (Signed)
Progress Note: General Surgery Service   Subjective: Slight abdominal pain  Objective: Vital signs in last 24 hours: Temp:  [98 F (36.7 C)-100 F (37.8 C)] 98 F (36.7 C) (11/11 0447) Pulse Rate:  [67-85] 67 (11/11 0447) Resp:  [18] 18 (11/11 0447) BP: (101-144)/(45-66) 118/47 (11/11 0447) SpO2:  [96 %-99 %] 98 % (11/11 0447) Weight:  [60.1 kg (132 lb 6.4 oz)] 60.1 kg (132 lb 6.4 oz) (11/11 0447) Last BM Date: 10/03/16  Intake/Output from previous day: No intake/output data recorded. Intake/Output this shift: No intake/output data recorded.  Lungs: CTAB  Cardiovascular: RRR  Abd: soft, NT, ND  Extremities: no edema  Back: no sacral decubitus ulcer  Neuro: AOx4  Lab Results: CBC   Recent Labs  10/03/16 0205 10/04/16 0459  WBC 13.3* 18.1*  HGB 12.2 12.9  HCT 35.5* 37.3  PLT 270 296   BMET  Recent Labs  10/01/16 1206  NA 132*  K 3.4*  CL 102  CO2 19*  GLUCOSE 146*  BUN 10  CREATININE 0.79  CALCIUM 8.8*   PT/INR  Recent Labs  10/03/16 0205 10/04/16 0459  LABPROT 27.2* 31.4*  INR 2.47 2.95   ABG No results for input(s): PHART, HCO3 in the last 72 hours.  Invalid input(s): PCO2, PO2  Studies/Results:  Anti-infectives: Anti-infectives    Start     Dose/Rate Route Frequency Ordered Stop   09/30/16 1300  amoxicillin (AMOXIL) capsule 500 mg     500 mg Oral Every 12 hours 09/30/16 1236     09/30/16 1300  clarithromycin (BIAXIN) tablet 500 mg     500 mg Oral Every 12 hours 09/30/16 1236     09/15/16 2230  ciprofloxacin (CIPRO) IVPB 400 mg  Status:  Discontinued     400 mg 200 mL/hr over 60 Minutes Intravenous Every 12 hours 09/15/16 2215 09/17/16 1249      Medications: Scheduled Meds: . acetaminophen  1,000 mg Oral TID  . amoxicillin  500 mg Oral Q12H  . carvedilol  25 mg Oral BID WC  . clarithromycin  500 mg Oral Q12H  . dextrose  50 mL Intravenous Once  . enalapril  5 mg Oral BID  . feeding supplement  1 Container Oral TID BM   . feeding supplement (PRO-STAT SUGAR FREE 64)  30 mL Oral TID  . lip balm   Topical BID  . pantoprazole  40 mg Oral BID  . saccharomyces boulardii  250 mg Oral BID  . sodium chloride flush  3 mL Intravenous Q12H  . sucralfate  1 g Oral TID WC & HS  . warfarin  7.5 mg Oral ONCE-1800  . Warfarin - Pharmacist Dosing Inpatient   Does not apply q1800   Continuous Infusions: . lactated ringers 10 mL/hr at 09/30/16 0955   PRN Meds:.sodium chloride, ALPRAZolam, alum & mag hydroxide-simeth, diphenoxylate-atropine, fentaNYL (SUBLIMAZE) injection, hydrALAZINE, magic mouthwash, menthol-cetylpyridinium, metoprolol, nitroGLYCERIN, ondansetron, oxyCODONE, phenol, sodium chloride flush, zolpidem  Assessment/Plan: Patient Active Problem List   Diagnosis Date Noted  . H/O heart valve replacement with mechanical valve   . Intra-abdominal fluid collection s/p perc drain 10/29 09/25/2016  . Warfarin-induced coagulopathy (HCC) 09/25/2016  . Acute kidney injury (HCC) 09/25/2016  . Anemia 09/25/2016  . Intra-abdominal infection   . Subhepatic fluid collection   . Abdominal pain 09/15/2016  . CAD (coronary artery disease)   . Nausea with vomiting 09/04/2016  . Anxiety state 09/04/2016  . Tobacco abuse 09/04/2016  . Coronary artery disease involving native  coronary artery of native heart without angina pectoris   . Mitral valve disease, rheumatic   . Acute cholecystitis s/p lap cholecystectomy 09/08/2016 09/03/2016  . CVA (cerebral infarction) 09/27/2012  . 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 Elevated WBC count 09/24/2012  . Blood glucose elevated 09/24/2012  . Chronic back pain   . Myocardial infarction, anterior wall (HCC)   . HYPERLIPIDEMIA, MIXED 08/27/2010  . Coronary atherosclerosis 08/27/2010  . Chronic systolic heart failure (HCC) 08/27/2010  . MITRAL VALVE REPLACEMENT, HX OF 08/27/2010   s/p Procedure(s): ESOPHAGOGASTRODUODENOSCOPY (EGD) 09/30/2016 S/p lap chole with  fluid collection in fossa s/p drain without infection -WBC increased today, no external signs of infection, UA neg x2, c diff previously negative, complains only of upper abdominal pain likely related to esophagitis vs duodenitis -continue PPI -continue h pylori treatment -recheck labs in am    LOS: 19 days   Rodman PickleLuke Aaron Kaneisha Ellenberger, MD Pg# [phone] [facility]

## 2016-10-05 ENCOUNTER — Inpatient Hospital Stay (HOSPITAL_COMMUNITY): Payer: Medicaid Other

## 2016-10-05 LAB — COMPREHENSIVE METABOLIC PANEL
ALBUMIN: 2.7 g/dL — AB (ref 3.5–5.0)
ALK PHOS: 98 U/L (ref 38–126)
ALT: 23 U/L (ref 14–54)
ANION GAP: 12 (ref 5–15)
AST: 27 U/L (ref 15–41)
BUN: 7 mg/dL (ref 6–20)
CALCIUM: 8.9 mg/dL (ref 8.9–10.3)
CO2: 24 mmol/L (ref 22–32)
Chloride: 99 mmol/L — ABNORMAL LOW (ref 101–111)
Creatinine, Ser: 0.76 mg/dL (ref 0.44–1.00)
GFR calc Af Amer: >60 mL/min (ref >60)
GFR calc non Af Amer: >60 mL/min (ref >60)
GLUCOSE: 132 mg/dL — AB (ref 65–99)
POTASSIUM: 2.5 mmol/L — AB (ref 3.5–5.1)
SODIUM: 135 mmol/L (ref 135–145)
Total Bilirubin: 0.6 mg/dL (ref 0.3–1.2)
Total Protein: 6.7 g/dL (ref 6.5–8.1)

## 2016-10-05 LAB — CBC
HCT: 39.3 % (ref 36.0–46.0)
HEMOGLOBIN: 13.4 g/dL (ref 12.0–15.0)
MCH: 31.4 pg (ref 26.0–34.0)
MCHC: 34.1 g/dL (ref 30.0–36.0)
MCV: 92 fL (ref 78.0–100.0)
Platelets: 325 10*3/uL (ref 150–400)
RBC: 4.27 MIL/uL (ref 3.87–5.11)
RDW: 13.6 % (ref 11.5–15.5)
WBC: 20.5 10*3/uL — AB (ref 4.0–10.5)

## 2016-10-05 LAB — PROTIME-INR
INR: 3.57
PROTHROMBIN TIME: 36.5 s — AB (ref 11.4–15.2)

## 2016-10-05 MED ORDER — IOPAMIDOL (ISOVUE-300) INJECTION 61%
INTRAVENOUS | Status: AC
  Filled 2016-10-05: qty 30

## 2016-10-05 MED ORDER — IOPAMIDOL (ISOVUE-300) INJECTION 61%
INTRAVENOUS | Status: AC
  Administered 2016-10-05: 100 mL
  Filled 2016-10-05: qty 100

## 2016-10-05 MED ORDER — POTASSIUM CHLORIDE CRYS ER 20 MEQ PO TBCR
40.0000 meq | EXTENDED_RELEASE_TABLET | Freq: Once | ORAL | Status: AC
  Administered 2016-10-05: 40 meq via ORAL
  Filled 2016-10-05: qty 2

## 2016-10-05 MED ORDER — WARFARIN SODIUM 5 MG PO TABS
2.5000 mg | ORAL_TABLET | Freq: Once | ORAL | Status: AC
  Administered 2016-10-05: 2.5 mg via ORAL
  Filled 2016-10-05: qty 1

## 2016-10-05 NOTE — Progress Notes (Signed)
Progress Note: General Surgery Service   Subjective: Worsened abdominal pain this morning, nausea. Increased WBC.  Objective: Vital signs in last 24 hours: Temp:  [97.8 F (36.6 C)-98.4 F (36.9 C)] 98.4 F (36.9 C) (11/12 0446) Pulse Rate:  [68-87] 74 (11/12 0446) Resp:  [16-18] 16 (11/12 0446) BP: (101-113)/(50-62) 101/50 (11/12 0446) SpO2:  [97 %-98 %] 97 % (11/12 0446) Weight:  [60 kg (132 lb 6 oz)] 60 kg (132 lb 6 oz) (11/12 0446) Last BM Date: 10/03/16  Intake/Output from previous day: 11/11 0701 - 11/12 0700 In: 540 [P.O.:540] Out: -  Intake/Output this shift: No intake/output data recorded.  Lungs: CTAB  Cardiovascular: RRR  Abd: soft, RUQ mildly tender, ND  Extremities: no edema  Back: no sacral decubitus ulcer  Neuro: AOx4  Lab Results: CBC   Recent Labs  10/04/16 0459 10/05/16 0313  WBC 18.1* 20.5*  HGB 12.9 13.4  HCT 37.3 39.3  PLT 296 325   BMET  Recent Labs  10/05/16 0313  NA 135  K 2.5*  CL 99*  CO2 24  GLUCOSE 132*  BUN 7  CREATININE 0.76  CALCIUM 8.9   PT/INR  Recent Labs  10/04/16 0459 10/05/16 0313  LABPROT 31.4* 36.5*  INR 2.95 3.57   ABG No results for input(s): PHART, HCO3 in the last 72 hours.  Invalid input(s): PCO2, PO2  Studies/Results:  Anti-infectives: Anti-infectives    Start     Dose/Rate Route Frequency Ordered Stop   09/30/16 1300  amoxicillin (AMOXIL) capsule 500 mg     500 mg Oral Every 12 hours 09/30/16 1236     09/30/16 1300  clarithromycin (BIAXIN) tablet 500 mg     500 mg Oral Every 12 hours 09/30/16 1236     09/15/16 2230  ciprofloxacin (CIPRO) IVPB 400 mg  Status:  Discontinued     400 mg 200 mL/hr over 60 Minutes Intravenous Every 12 hours 09/15/16 2215 09/17/16 1249      Medications: Scheduled Meds: . acetaminophen  1,000 mg Oral TID  . amoxicillin  500 mg Oral Q12H  . carvedilol  25 mg Oral BID WC  . clarithromycin  500 mg Oral Q12H  . enalapril  5 mg Oral BID  . feeding  supplement  1 Container Oral TID BM  . feeding supplement (PRO-STAT SUGAR FREE 64)  30 mL Oral TID  . lip balm   Topical BID  . pantoprazole  40 mg Oral BID  . saccharomyces boulardii  250 mg Oral BID  . sodium chloride flush  3 mL Intravenous Q12H  . sucralfate  1 g Oral TID WC & HS  . warfarin  2.5 mg Oral ONCE-1800  . Warfarin - Pharmacist Dosing Inpatient   Does not apply q1800   Continuous Infusions: . lactated ringers 10 mL/hr at 09/30/16 0955   PRN Meds:.sodium chloride, ALPRAZolam, alum & mag hydroxide-simeth, diphenoxylate-atropine, fentaNYL (SUBLIMAZE) injection, hydrALAZINE, magic mouthwash, menthol-cetylpyridinium, metoprolol, nitroGLYCERIN, ondansetron, oxyCODONE, phenol, sodium chloride flush, zolpidem  Assessment/Plan: Patient Active Problem List   Diagnosis Date Noted  . H/O heart valve replacement with mechanical valve   . Intra-abdominal fluid collection s/p perc drain 10/29 09/25/2016  . Warfarin-induced coagulopathy (HCC) 09/25/2016  . Acute kidney injury (HCC) 09/25/2016  . Anemia 09/25/2016  . Intra-abdominal infection   . Subhepatic fluid collection   . Abdominal pain 09/15/2016  . CAD (coronary artery disease)   . Nausea with vomiting 09/04/2016  . Anxiety state 09/04/2016  . Tobacco abuse 09/04/2016  .  Coronary artery disease involving native coronary artery of native heart without angina pectoris   . Mitral valve disease, rheumatic   . Acute cholecystitis s/p lap cholecystectomy 09/08/2016 09/03/2016  . CVA (cerebral infarction) 09/27/2012  . 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 Elevated WBC count 09/24/2012  . Blood glucose elevated 09/24/2012  . Chronic back pain   . Myocardial infarction, anterior wall (HCC)   . HYPERLIPIDEMIA, MIXED 08/27/2010  . Coronary atherosclerosis 08/27/2010  . Chronic systolic heart failure (HCC) 08/27/2010  . MITRAL VALVE REPLACEMENT, HX OF 08/27/2010   s/p Procedure(s): ESOPHAGOGASTRODUODENOSCOPY  (EGD) 09/30/2016 S/p lap chole with fluid collection in fossa s/p drain without infection -WBC increased again today, no external signs of infection, UA neg x2, c diff previously negative, complains only of right upper abdominal pain possibly related to esophagitis vs duodenitis. Will repeat CT today. -continue PPI -continue h pylori treatment -recheck labs in am    LOS: 20 days   Brooke Buehelsea A Connor, MD [facility]

## 2016-10-05 NOTE — Progress Notes (Signed)
ANTICOAGULATION CONSULT NOTE - Follow Up Consult  Pharmacy Consult for Heparin/Warfarin Indication: MVR  Allergies  Allergen Reactions  . Cyclobenzaprine Hcl Nausea And Vomiting  . Latex Other (See Comments)    Unknown   Patient Measurements: Height: 5\' 4"  (162.6 cm) Weight: 132 lb 6 oz (60 kg) IBW/kg (Calculated) : 54.7  Vital Signs: Temp: 98.4 F (36.9 C) (11/12 0446) Temp Source: Oral (11/12 0446) BP: 101/50 (11/12 0446) Pulse Rate: 74 (11/12 0446)  Labs:  Recent Labs  10/03/16 0205 10/04/16 0459 10/05/16 0313  HGB 12.2 12.9 13.4  HCT 35.5* 37.3 39.3  PLT 270 296 325  LABPROT 27.2* 31.4* 36.5*  INR 2.47 2.95 3.57  HEPARINUNFRC 0.50 0.50  --   CREATININE  --   --  0.76    Estimated Creatinine Clearance: 69.4 mL/min (by C-G formula based on SCr of 0.76 mg/dL).  Assessment: 55y on Warfarin with Heparin overlap.  INR today is 3.57, up from 2.9 yesterday. WBC trending up.  Her CBC indicates a stable H/H and no noted bleeding complications. We have used conservative dosing for now with history of labile INR values. Also noted recently added amoxicillin/clarithromycin for H pylori - clarithromycin has major interaction with warfarin and will likely affect INR. She has had 6 days of therapy now with 7.5mg , this may need to be her maintenance dose while she is taking Clarithromycin.  PTA warfarin dose: 10mg  daily exc for 7.5mg  on Mon/Thurs  Goal of Therapy:   INR 2.5-3.5 per MD note Monitor platelets by anticoagulation protocol: Yes   Plan:  INR slightly supratherapeutic  Warfarin 2.5mg  x 1 dose tonight Monitor for s/sx bleeding Needs close outpatient INR monitoring while on clarithromycin   Cher NakaiSheema Aquinnah Devin, PharmD. Clinical Pharmacist Pager:  [phone] Thank you for allowing pharmacy to be part of this patients care team. 10/05/2016 9:16 AM

## 2016-10-05 NOTE — Progress Notes (Signed)
CRITICAL VALUE ALERT  Critical value received:  Potassium 2.5  Date of notification:  10/05/2016  Time of notification:  0448  Critical value read back:Yes.    Nurse who received alert:  Raoul Ciano  MD notified (1st page):  [facility]  Time of first page:  0510  MD notified (2nd page):  Time of second page:  Responding MD:  Hyman HopesJegede, MD  Time MD responded:  [phone]

## 2016-10-06 DIAGNOSIS — R638 Other symptoms and signs concerning food and fluid intake: Secondary | ICD-10-CM

## 2016-10-06 LAB — MAGNESIUM: MAGNESIUM: 1.7 mg/dL (ref 1.7–2.4)

## 2016-10-06 LAB — CBC
HCT: 38.6 % (ref 36.0–46.0)
HEMOGLOBIN: 13.3 g/dL (ref 12.0–15.0)
MCH: 31.6 pg (ref 26.0–34.0)
MCHC: 34.5 g/dL (ref 30.0–36.0)
MCV: 91.7 fL (ref 78.0–100.0)
PLATELETS: 339 10*3/uL (ref 150–400)
RBC: 4.21 MIL/uL (ref 3.87–5.11)
RDW: 13.7 % (ref 11.5–15.5)
WBC: 22.3 10*3/uL — AB (ref 4.0–10.5)

## 2016-10-06 LAB — BASIC METABOLIC PANEL
ANION GAP: 12 (ref 5–15)
BUN: 11 mg/dL (ref 6–20)
CHLORIDE: 97 mmol/L — AB (ref 101–111)
CO2: 22 mmol/L (ref 22–32)
Calcium: 9 mg/dL (ref 8.9–10.3)
Creatinine, Ser: 0.79 mg/dL (ref 0.44–1.00)
GFR calc Af Amer: >60 mL/min (ref >60)
Glucose, Bld: 124 mg/dL — ABNORMAL HIGH (ref 65–99)
POTASSIUM: 2.5 mmol/L — AB (ref 3.5–5.1)
SODIUM: 131 mmol/L — AB (ref 135–145)

## 2016-10-06 LAB — PROTIME-INR
INR: 3.81
PROTHROMBIN TIME: 40.5 s — AB (ref 11.4–15.2)

## 2016-10-06 LAB — C DIFFICILE QUICK SCREEN W PCR REFLEX
C DIFFICILE (CDIFF) INTERP: NOT DETECTED
C DIFFICILE (CDIFF) TOXIN: NEGATIVE
C DIFFICLE (CDIFF) ANTIGEN: NEGATIVE

## 2016-10-06 MED ORDER — POTASSIUM CHLORIDE CRYS ER 20 MEQ PO TBCR
20.0000 meq | EXTENDED_RELEASE_TABLET | Freq: Three times a day (TID) | ORAL | Status: DC
  Administered 2016-10-06 (×2): 20 meq via ORAL
  Filled 2016-10-06 (×2): qty 1

## 2016-10-06 MED ORDER — WARFARIN 0.5 MG HALF TABLET
0.5000 mg | ORAL_TABLET | Freq: Once | ORAL | Status: AC
  Administered 2016-10-06: 0.5 mg via ORAL
  Filled 2016-10-06: qty 1

## 2016-10-06 MED ORDER — POTASSIUM CHLORIDE CRYS ER 20 MEQ PO TBCR
20.0000 meq | EXTENDED_RELEASE_TABLET | Freq: Once | ORAL | Status: AC
  Administered 2016-10-06: 20 meq via ORAL
  Filled 2016-10-06: qty 1

## 2016-10-06 NOTE — Progress Notes (Signed)
ANTICOAGULATION CONSULT NOTE  Pharmacy Consult for warfarin Indication: MVR  Allergies  Allergen Reactions  . Cyclobenzaprine Hcl Nausea And Vomiting  . Latex Other (See Comments)    Unknown   Patient Measurements: Height: 5\' 4"  (162.6 cm) Weight: 136 lb 3.2 oz (61.8 kg) IBW/kg (Calculated) : 54.7  Vital Signs: Temp: 100 F (37.8 C) (11/13 0601) Temp Source: Oral (11/13 0601) BP: 95/48 (11/13 0601) Pulse Rate: 86 (11/13 0601)  Labs:  Recent Labs  10/04/16 0459 10/05/16 0313 10/06/16 0511  HGB 12.9 13.4 13.3  HCT 37.3 39.3 38.6  PLT 296 325 339  LABPROT 31.4* 36.5* 40.5*  INR 2.95 3.57 3.81  HEPARINUNFRC 0.50  --   --   CREATININE  --  0.76 0.79    Estimated Creatinine Clearance: 69.4 mL/min (by C-G formula based on SCr of 0.79 mg/dL).  Assessment: 55y on warfarin for history of MVR.  INR currently 3.8, only slightly supratherapeutic for goal 2.5-3.5. Pt is also on amoxicillin/clarithromycin for H pylori - clarithromycin has major interaction with warfarin and is likely the cause for her labile INRs. CBC indicates a stable H/H and no noted bleeding complications.  PTA warfarin dose: 10mg  daily exc for 7.5mg  on Mon/Thurs  Goal of Therapy:  INR 2.5-3.5 Monitor platelets by anticoagulation protocol: Yes   Plan:  -Warfarin 0.5 mg po x1 -Daily INR, monitor closely while on clarithromycin    Agapito GamesAlison Raffaela Ladley, PharmD, BCPS Clinical Pharmacist 10/06/2016 8:29 AM

## 2016-10-06 NOTE — Progress Notes (Signed)
    [facility]   Reason for visit: Follow up on leukocytosis  Interval History: called back to see patient with worsening leukocytosis, now up to 22.  Has had continuous abdominal pain, unchanged and CT done showing some colitis, concerning for ischemic colitis.  No fever with Tmax of 100.  No other localizing symptoms.  C diff tested and negative.  She eats some but some abdominal pain with food but not always.    Physical Exam: Constitutional:  Vitals:   10/05/16 2027 10/06/16 0601  BP: 102/60 (!) 95/48  Pulse: 86 86  Resp: 16 16  Temp: 98.7 F (37.1 C) 100 F (37.8 C)   patient appears in NAD Eyes: anicteric HENT: no thrush Respiratory: Normal respiratory effort; CTA B Cardiovascular: RRR GI: soft, nt, nd  Review of Systems: Constitutional: negative for fevers and chills Gastrointestinal: negative for nausea and vomiting Integument/breast: negative for rash  Lab Results  Component Value Date   WBC 22.3 (H) 10/06/2016   HGB 13.3 10/06/2016   HCT 38.6 10/06/2016   MCV 91.7 10/06/2016   PLT 339 10/06/2016    Lab Results  Component Value Date   CREATININE 0.79 10/06/2016   BUN 11 10/06/2016   NA 131 (L) 10/06/2016   K 2.5 (LL) 10/06/2016   CL 97 (L) 10/06/2016   CO2 22 10/06/2016    Lab Results  Component Value Date   ALT 23 10/05/2016   AST 27 10/05/2016   ALKPHOS 98 10/05/2016     Microbiology: Recent Results (from the past 240 hour(s))  C difficile quick scan w PCR reflex     Status: None   Collection Time: 10/06/16 12:47 PM  Result Value Ref Range Status   C Diff antigen NEGATIVE NEGATIVE Final   C Diff toxin NEGATIVE NEGATIVE Final   C Diff interpretation No C. difficile detected.  Final    Impression/Plan:  1. Leukocytosis - unclear etiology.  No signs of localizing infection.  C diff negative.  Recheck in am Continue to observe off of antibiotics.  2. Diarrhea - no change. 3. Nutrition - poor appetite.  Working on  increasing intake.    I will continue to follow

## 2016-10-06 NOTE — Progress Notes (Signed)
Nutrition Follow-up  DOCUMENTATION CODES:   Not applicable  INTERVENTION:  Continue Boost Breeze po TID, each supplement provides 250 kcal and 9 grams of protein.  Continue 30 ml Prostat po TID, each supplement provides 100 kcal and 15 grams of protein.   Encourage adequate PO intake.   Pt with poor po intake, recommend consideration of enteral nutrition via post pyloric NGT. Recommend nocturnal tube feeds to allow for encouragement of po intake during the day. Jevity 1.5 at goal rate of 75 ml/hr x 12 hours (7pm-7am) to provide 1350 kcal (75% of needs), 57 grams of protein, and 729 ml of free water.   NUTRITION DIAGNOSIS:   Inadequate oral intake related to poor appetite as evidenced by meal completion < 50%; ongoing  GOAL:   Patient will meet greater than or equal to 90% of their needs; not met  MONITOR:   PO intake, Supplement acceptance, Labs, Weight trends, Skin, I & O's  REASON FOR ASSESSMENT:   Malnutrition Screening Tool    ASSESSMENT:   55 y.o. 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 Lucia Gaskins, discharged on 10/20.  Readmitted from ED on 10/23 for abnormal CT completed at [facility], indicating possible post-op complications of bile leak vs infection.  CT abd on 10/27 showed fluid/gas collection in GB fossa indicating possible infection. Percutaneous drain placed in subhepatic gallbladder fossa fluid collection 10/29.   Calorie count: 10/04/16 Breakfast: 227 kcal, 4 grams of protein Lunch: no information given Dinner: 174 kcal, and 5 grams of protein Supplements: 350 kcal, 24 grams of protein  Total estimated intake: 751 kcal (42% of kcal needs) 33 grams of protein (44% of protein needs)  11/13: Pt reports consuming a sausage biscuit this AM at  breakfast as abdominal pains have improved, however pt is not meeting her estimated nutrition needs. Pt reports having abdominal pains over the weekend thus unable to eat much of her food at meals. Recommend nocturnal tube feeds via post pyloric NGT as pt with continued poor po intake. See detailed recommendations above.   Potassium low at 2.5.  Diet Order:  Diet regular Room service appropriate? Yes; Fluid consistency: Thin  Skin:   (Incision on abdomen)  Last BM:  11/13  Height:   Ht Readings from Last 1 Encounters:  09/30/16 '5\' 4"'$  (1.626 m)    Weight:   Wt Readings from Last 1 Encounters:  10/06/16 136 lb 3.2 oz (61.8 kg)    Ideal Body Weight:  54.5 kg  BMI:  Body mass index is 23.38 kg/m.  Estimated Nutritional Needs:   Kcal:  1800-1900  Protein:  75-85 grams  Fluid:  1.8 - 1.9 L/day  EDUCATION NEEDS:   No education needs identified at this time  Corrin Parker, MS, RD, LDN Pager # [phone] After hours/ weekend pager # [phone]

## 2016-10-06 NOTE — Progress Notes (Signed)
Patient ID: Jackquline BoschMarlana W Nuss, female   DOB: [birth year], 55 y.o.   MRN: [id number]  [facility]  6 Days [facility]  Subjective: Abdominal pain about the same. At times she is fine, typically worse after eating. The pain is epigastric/RUQ. No emesis. Loose BM this AM  Objective: Vital signs in last 24 hours: Temp:  [98 F (36.7 C)-100 F (37.8 C)] 100 F (37.8 C) (11/13 0601) Pulse Rate:  [73-86] 86 (11/13 0601) Resp:  [15-16] 16 (11/13 0601) BP: (95-102)/(42-60) 95/48 (11/13 0601) SpO2:  [97 %-98 %] 98 % (11/13 0601) Weight:  [136 lb 3.2 oz (61.8 kg)] 136 lb 3.2 oz (61.8 kg) (11/13 0601) Last BM Date: 10/06/16  Intake/Output from previous day: 11/12 0701 - 11/13 0700 In: 100 [P.O.:100] Out: -  Intake/Output this shift: No intake/output data recorded.  PE: Gen:  Alert, NAD, pleasant Card:  RRR Pulm:  CTAB Abd: well healing lap incisions, Soft, ND, +BS, TTP epigastrium/RUQ  Lab Results:   Recent Labs  10/05/16 0313 10/06/16 0511  WBC 20.5* 22.3*  HGB 13.4 13.3  HCT 39.3 38.6  PLT 325 339   BMET  Recent Labs  10/05/16 0313 10/06/16 0511  NA 135 131*  K 2.5* 2.5*  CL 99* 97*  CO2 24 22  GLUCOSE 132* 124*  BUN 7 11  CREATININE 0.76 0.79  CALCIUM 8.9 9.0   PT/INR  Recent Labs  10/05/16 0313 10/06/16 0511  LABPROT 36.5* 40.5*  INR 3.57 3.81   CMP     Component Value Date/Time   NA 131 (L) 10/06/2016 0511   K 2.5 (LL) 10/06/2016 0511   CL 97 (L) 10/06/2016 0511   CO2 22 10/06/2016 0511   GLUCOSE 124 (H) 10/06/2016 0511   BUN 11 10/06/2016 0511   CREATININE 0.79 10/06/2016 0511   CALCIUM 9.0 10/06/2016 0511   PROT 6.7 10/05/2016 0313   ALBUMIN 2.7 (L) 10/05/2016 0313   AST 27 10/05/2016 0313   ALT 23 10/05/2016 0313   ALKPHOS 98 10/05/2016 0313   BILITOT 0.6 10/05/2016 0313   GFRNONAA >60 10/06/2016 0511   GFRAA >60 10/06/2016 0511   Lipase  No results found for: LIPASE     Studies/Results: Ct Abdomen Pelvis  W Contrast  Result Date: 10/05/2016 CLINICAL DATA:  Fever of unknown origin. Diffuse abdominal pain and nausea elevated white blood cell count EXAM: CT ABDOMEN AND PELVIS WITH CONTRAST TECHNIQUE: Multidetector CT imaging of the abdomen and pelvis was performed using the standard protocol following bolus administration of intravenous contrast. CONTRAST:  100mL ISOVUE-300 IOPAMIDOL (ISOVUE-300) INJECTION 61% COMPARISON:  09/25/2016 FINDINGS: Lower chest: Lung bases are clear. Hepatobiliary: Small amount of gas and fluid in the gallbladder fossa following cholecystectomy is felt within normal limits. Interval removal of the percutaneous drainage catheter. There is no intrahepatic duct dilatation. Common bile duct normal caliber. Pancreas: No pancreatic inflammation.  No duct dilatation per Spleen: Normal spleen Adrenals/urinary tract: Adrenal glands and kidneys are normal. The ureters and bladder normal. Stomach/Bowel: The stomach, duodenum and jejunum are normal. The ileum is normal. No evidence of small dilatation or wall thickening. Terminal ileum appears normal. Appendix not identified. There is loss of definition of the transverse colon haustral markings. Mild thickening of the bowel wall (image 20, series 2). This occurs over approximately 15 cm segment. Bowel wall measures 4 mm in thickness (image 65, series 5). No pneumatosis. Descending colon sigmoid colon are normal. Vascular/Lymphatic: Intimal calcification of the abdominal aorta. 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 IMA is patent Reproductive: Uterus and ovaries normal Other: No free fluid. Musculoskeletal: No aggressive osseous lesion. IMPRESSION: 1. New segment of bowel wall thickening involving the transverse colon. Concern for mild / early ischemic colitis. 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 2. Small amount of fluid in the gallbladder fossa without evidence of progression. These results will be called to the ordering clinician or representative by the Radiologist Assistant, and communication documented in the PACS or zVision Dashboard. Electronically Signed   By: Genevive BiStewart  Edmunds M.D.   On: 10/05/2016 20:26    Anti-infectives: Anti-infectives    Start     Dose/Rate Route Frequency Ordered Stop   09/30/16 1300  amoxicillin (AMOXIL) capsule 500 mg     500 mg Oral Every 12 hours 09/30/16 1236     09/30/16 1300  clarithromycin (BIAXIN) tablet 500 mg     500 mg Oral Every 12 hours 09/30/16 1236     09/15/16 2230  ciprofloxacin (CIPRO) IVPB 400 mg  Status:  Discontinued     400 mg 200 mL/hr over 60 Minutes Intravenous Every 12 hours 09/15/16 2215 09/17/16 1249       Assessment/Plan Abdominal pain, Nausea S/p laparoscopic cholecystectomy10/16/17, Dr. Ezzard StandingNewman Negative HIDA 09/16/16 IR placement of drain 09/21/16- no growth on fluid cultured - drain removed 09/29/16 EGD 09/30/16: La grade C reflux esophagitis and duodenitis - on PO protonix, carafate H Pylori is positive - on Amoxicillin 1 gm BID, Clarithromycin 500 mg BID,PPI PO  Leukocytosis - WBC again elevated today at 22.3, low grade fevers TMAX 100 - CT yesterday: concern for mild / early ischemic colitis - will consult ID for recommendations Hypokalemia - 2.5 >> replace  [name] and left main stent on coumadin  - now supratherapeutic at 3.81 today. Coumadin per pharmacy  ID - UA negative x2, CXR normal, c diff pending FEN - regular VTE - heparin/coumadin per pharmacy  Plan - c diff pending. Will consult ID for recommendations on leukocytosis, colitis seen on CT. Labs in AM   LOS: 21 days    Edson SnowballBROOKE A MILLER , [facility] 10/06/2016, 12:24 PM Pager: [phone] Consults: [phone] Mon-Fri 7:00 am-4:30 pm Sat-Sun 7:00 am-11:30 am

## 2016-10-07 ENCOUNTER — Encounter (HOSPITAL_COMMUNITY): Payer: Self-pay | Admitting: *Deleted

## 2016-10-07 DIAGNOSIS — K529 Noninfective gastroenteritis and colitis, unspecified: Secondary | ICD-10-CM

## 2016-10-07 LAB — CBC
HCT: 39.1 % (ref 36.0–46.0)
HEMOGLOBIN: 13.8 g/dL (ref 12.0–15.0)
MCH: 32.2 pg (ref 26.0–34.0)
MCHC: 35.3 g/dL (ref 30.0–36.0)
MCV: 91.4 fL (ref 78.0–100.0)
Platelets: 366 10*3/uL (ref 150–400)
RBC: 4.28 MIL/uL (ref 3.87–5.11)
RDW: 13.6 % (ref 11.5–15.5)
WBC: 17.9 10*3/uL — ABNORMAL HIGH (ref 4.0–10.5)

## 2016-10-07 LAB — BASIC METABOLIC PANEL
Anion gap: 13 (ref 5–15)
BUN: 12 mg/dL (ref 6–20)
CHLORIDE: 99 mmol/L — AB (ref 101–111)
CO2: 20 mmol/L — AB (ref 22–32)
Calcium: 9 mg/dL (ref 8.9–10.3)
Creatinine, Ser: 0.8 mg/dL (ref 0.44–1.00)
GFR calc Af Amer: >60 mL/min (ref >60)
GFR calc non Af Amer: >60 mL/min (ref >60)
GLUCOSE: 143 mg/dL — AB (ref 65–99)
POTASSIUM: 2.9 mmol/L — AB (ref 3.5–5.1)
Sodium: 132 mmol/L — ABNORMAL LOW (ref 135–145)

## 2016-10-07 LAB — PROTIME-INR
INR: 3.59
Prothrombin Time: 36.7 seconds — ABNORMAL HIGH (ref 11.4–15.2)

## 2016-10-07 MED ORDER — OXYCODONE HCL 5 MG PO TABS: 5.0000 mg | ORAL_TABLET | Freq: Once | ORAL | Status: DC | PRN

## 2016-10-07 MED ORDER — POLYETHYLENE GLYCOL 3350 17 G PO PACK
17.0000 g | PACK | Freq: Two times a day (BID) | ORAL | Status: DC
  Administered 2016-10-07: 17 g via ORAL
  Filled 2016-10-07 (×2): qty 1

## 2016-10-07 MED ORDER — ALPRAZOLAM 0.5 MG PO TABS
0.5000 mg | ORAL_TABLET | Freq: Once | ORAL | Status: AC
  Administered 2016-10-07: 0.5 mg via ORAL
  Filled 2016-10-07: qty 1

## 2016-10-07 MED ORDER — ONDANSETRON HCL 4 MG/2ML IJ SOLN: 4.0000 mg | Freq: Once | INTRAMUSCULAR | Status: DC | PRN

## 2016-10-07 MED ORDER — FENTANYL CITRATE (PF) 100 MCG/2ML IJ SOLN: 25.0000 ug | INTRAMUSCULAR | Status: DC | PRN

## 2016-10-07 MED ORDER — DEXTROSE 5 % IV SOLN: 40.0000 meq | Freq: Two times a day (BID) | INTRAVENOUS | Status: DC

## 2016-10-07 MED ORDER — OXYCODONE HCL 5 MG/5ML PO SOLN: 5.0000 mg | Freq: Once | ORAL | Status: DC | PRN

## 2016-10-07 MED ORDER — SODIUM CHLORIDE 0.9 % IV SOLN
Freq: Once | INTRAVENOUS | Status: AC
  Administered 2016-10-07: 12:00:00 via INTRAVENOUS
  Filled 2016-10-07: qty 1000

## 2016-10-07 NOTE — Progress Notes (Signed)
Patient ID: [name], female   DOB: [birth year], 55 y.o.   MRN: [id number]  [facility]  7 Days Post-Op  Subjective: Patient states that she had a bad night yesterday. She had persistent abdominal pain and diarrhea. Denies n/v. The pain is suppressing her appetite. All she has eaten in the last 24 hours is 2 biscuits yesterday morning.  Objective: Vital signs in last 24 hours: Temp:  [97.7 F (36.5 C)-98.2 F (36.8 C)] 98.2 F (36.8 C) (11/14 0700) Pulse Rate:  [66-80] 80 (11/14 0700) Resp:  [16-17] 17 (11/14 0700) BP: (117-119)/(53-63) 119/63 (11/14 0700) SpO2:  [98 %-99 %] 99 % (11/14 0700) Weight:  [137 lb (62.1 kg)] 137 lb (62.1 kg) (11/14 0700) Last BM Date: 10/07/16  Intake/Output from previous day: No intake/output data recorded. Intake/Output this shift: No intake/output data recorded.  PE: Gen:  Alert, NAD, depressed Card:  RRR Pulm:  CTAB Abd: well healing lap incisions, Soft, ND, +BS, TTP globally about the abdomen but most severe in the upper quadrants and epigastrium  Lab Results:   Recent Labs  10/06/16 0511 10/07/16 0452  WBC 22.3* 17.9*  HGB 13.3 13.8  HCT 38.6 39.1  PLT 339 366   BMET  Recent Labs  10/06/16 0511 10/07/16 0452  NA 131* 132*  K 2.5* 2.9*  CL 97* 99*  CO2 22 20*  GLUCOSE 124* 143*  BUN 11 12  CREATININE 0.79 0.80  CALCIUM 9.0 9.0   PT/INR  Recent Labs  10/06/16 0511 10/07/16 0452  LABPROT 40.5* 36.7*  INR 3.81 3.59   CMP     Component Value Date/Time   NA 132 (L) 10/07/2016 0452   K 2.9 (L) 10/07/2016 0452   CL 99 (L) 10/07/2016 0452   CO2 20 (L) 10/07/2016 0452   GLUCOSE 143 (H) 10/07/2016 0452   BUN 12 10/07/2016 0452   CREATININE 0.80 10/07/2016 0452   CALCIUM 9.0 10/07/2016 0452   PROT 6.7 10/05/2016 0313   ALBUMIN 2.7 (L) 10/05/2016 0313   AST 27 10/05/2016 0313   ALT 23 10/05/2016 0313   ALKPHOS 98 10/05/2016 0313   BILITOT 0.6 10/05/2016 0313   GFRNONAA >60  10/07/2016 0452   GFRAA >60 10/07/2016 0452   Lipase  No results found for: LIPASE     Studies/Results: Ct Abdomen Pelvis W Contrast  Result Date: 10/05/2016 CLINICAL DATA:  Fever of unknown origin. Diffuse abdominal pain and nausea elevated white blood cell count EXAM: CT ABDOMEN AND PELVIS WITH CONTRAST TECHNIQUE: Multidetector CT imaging of the abdomen and pelvis was performed using the standard protocol following bolus administration of intravenous contrast. CONTRAST:  100mL ISOVUE-300 IOPAMIDOL (ISOVUE-300) INJECTION 61% COMPARISON:  09/25/2016 FINDINGS: Lower chest: Lung bases are clear. Hepatobiliary: Small amount of gas and fluid in the gallbladder fossa following cholecystectomy is felt within normal limits. Interval removal of the percutaneous drainage catheter. There is no intrahepatic duct dilatation. Common bile duct normal caliber. Pancreas: No pancreatic inflammation.  No duct dilatation per Spleen: Normal spleen Adrenals/urinary tract: Adrenal glands and kidneys are normal. The ureters and bladder normal. Stomach/Bowel: The stomach, duodenum and jejunum are normal. The ileum is normal. No evidence of small dilatation or wall thickening. Terminal ileum appears normal. Appendix not identified. There is loss of definition of the transverse colon haustral markings. Mild thickening of the bowel wall (image 20, series 2). This occurs over approximately 15 cm segment. Bowel wall measures 4 mm in thickness (image 65, series 5). No  pneumatosis. Descending colon sigmoid colon are normal. Vascular/Lymphatic: Intimal calcification of the abdominal aorta. 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 IMA is patent Reproductive: Uterus and ovaries normal Other: No free fluid. Musculoskeletal: No aggressive osseous lesion. IMPRESSION: 1. New segment of bowel wall thickening  involving the transverse colon. Concern for mild / early ischemic colitis. 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 2. Small amount of fluid in the gallbladder fossa without evidence of progression. These results will be called to the ordering clinician or representative by the Radiologist Assistant, and communication documented in the PACS or zVision Dashboard. Electronically Signed   By: Genevive BiStewart  Edmunds M.D.   On: 10/05/2016 20:26    Anti-infectives: Anti-infectives    Start     Dose/Rate Route Frequency Ordered Stop   09/30/16 1300  amoxicillin (AMOXIL) capsule 500 mg     500 mg Oral Every 12 hours 09/30/16 1236     09/30/16 1300  clarithromycin (BIAXIN) tablet 500 mg     500 mg Oral Every 12 hours 09/30/16 1236     09/15/16 2230  ciprofloxacin (CIPRO) IVPB 400 mg  Status:  Discontinued     400 mg 200 mL/hr over 60 Minutes Intravenous Every 12 hours 09/15/16 2215 09/17/16 1249       Assessment/Plan Abdominal pain, Nausea S/p laparoscopic cholecystectomy10/16/17, Dr. Ezzard StandingNewman Negative HIDA 09/16/16 IR placement of drain 09/21/16- no growth on fluid cultured - drain removed 09/29/16 EGD 09/30/16: La grade C reflux esophagitis and duodenitis - on PO protonix, carafate H Pylori is positive - on Amoxicillin 1 gm BID, Clarithromycin 500 mg BID,PPI PO  Leukocytosis - WBC trending down, 17.9 today from 22.3, afebrile - CT: concern for mild / early ischemic colitis - appreciate ID recommdations Hypokalemia - 2.9 >> patient cannot tolerate PO K, will switch to IV  [name] and left main stent on coumadin  - still supratherapeutic at 3.59 today. Coumadin per pharmacy  ID - UA negative x2, CXR normal, c diff negative FEN - regular VTE - heparin/coumadin per pharmacy  Plan - patient is still depressed, having abdominal pain, diarrhea, and suppressed appetite. Appreciate ID recommendations. I have spoken with Dr. Ronney AstersBucchini who has agreed to  see the patient, they will plan for colonoscopy in the next 1-2 days once her INR is closer to 3. Hold coumadin for now (discussed this with pharmacy). Continue probiotic Will stop amoxicillin and biaxin WBC is trending down again today.  K increased from yesterday but still low (2.9); patient cannot tolerate tablets, will switch to IV. Labs in AM   LOS: 22 days    Edson SnowballBROOKE A MILLER , [facility] 10/07/2016, 9:56 AM Pager: [phone] Consults: [phone] Mon-Fri 7:00 am-4:30 pm Sat-Sun 7:00 am-11:30 am

## 2016-10-07 NOTE — Progress Notes (Signed)
    [facility]   Reason for visit: Follow up on leukocytosis  Interval History: continues to have the same pain, diarrhea; complains of not getting enough pain medication at night.  No new symptoms  Physical Exam: Constitutional:  Vitals:   10/07/16 0700 10/07/16 1026  BP: 119/63 (!) 112/59  Pulse: 80   Resp: 17   Temp: 98.2 F (36.8 C)    patient appears in NAD Respiratory: Normal respiratory effort; CTA B Cardiovascular: RRR GI: soft, no rebound or guarding  Review of Systems: Constitutional: negative for fevers and chills Respiratory: negative for cough or pleurisy/chest pain Integument/breast: negative for rash Musculoskeletal: negative for myalgias and arthralgias  Lab Results  Component Value Date   WBC 17.9 (H) 10/07/2016   HGB 13.8 10/07/2016   HCT 39.1 10/07/2016   MCV 91.4 10/07/2016   PLT 366 10/07/2016    Lab Results  Component Value Date   CREATININE 0.80 10/07/2016   BUN 12 10/07/2016   NA 132 (L) 10/07/2016   K 2.9 (L) 10/07/2016   CL 99 (L) 10/07/2016   CO2 20 (L) 10/07/2016    Lab Results  Component Value Date   ALT 23 10/05/2016   AST 27 10/05/2016   ALKPHOS 98 10/05/2016     Microbiology: Recent Results (from the past 240 hour(s))  C difficile quick scan w PCR reflex     Status: None   Collection Time: 10/06/16 12:47 PM  Result Value Ref Range Status   C Diff antigen NEGATIVE NEGATIVE Final   C Diff toxin NEGATIVE NEGATIVE Final   C Diff interpretation No C. difficile detected.  Final    Impression/Plan:  1. Leukocytosis - improved a bit today.  I suspect transient with no source of any infection.   2. Colitis - C diff negative and no source otherwise.  I do not feel a GI pathogen panel is indicated since this has been an ongoing issue and has had antibiotics.  I suspect non infectious colitis.

## 2016-10-07 NOTE — Progress Notes (Signed)
cdiff results came back negative. MD on call notified of results. Unit Director was also informed. Enteric precautions discontinued.

## 2016-10-07 NOTE — Progress Notes (Signed)
ANTICOAGULATION CONSULT NOTE  Pharmacy Consult for warfarin Indication: MVR  Allergies  Allergen Reactions  . Cyclobenzaprine Hcl Nausea And Vomiting  . Latex Other (See Comments)    Unknown   Patient Measurements: Height: 5\' 4"  (162.6 cm) Weight: 137 lb (62.1 kg) IBW/kg (Calculated) : 54.7  Vital Signs: Temp: 97.5 F (36.4 C) (11/14 1228) Temp Source: Oral (11/14 1228) BP: 115/63 (11/14 1228) Pulse Rate: 75 (11/14 1228)  Labs:  Recent Labs  10/05/16 0313 10/06/16 0511 10/07/16 0452  HGB 13.4 13.3 13.8  HCT 39.3 38.6 39.1  PLT 325 339 366  LABPROT 36.5* 40.5* 36.7*  INR 3.57 3.81 3.59  CREATININE 0.76 0.79 0.80    Estimated Creatinine Clearance: 69.4 mL/min (by C-G formula based on SCr of 0.8 mg/dL).  Assessment: 55y on warfarin for history of MVR.  INR currently 3.8, only slightly supratherapeutic for goal 2.5-3.5. Pt has been on amoxicillin/clarithromycin (dc'd today) for H pylori - clarithromycin has major interaction with warfarin and is likely the cause for her labile INRs. CBC indicates a stable H/H and no noted bleeding complications. Discussed plan with surgery team, hold warfarin to allow coumadin to come down for colonoscopy.    PTA warfarin dose: 10mg  daily exc for 7.5mg  on Mon/Thurs  Goal of Therapy:  INR 2.5-3.5 Monitor platelets by anticoagulation protocol: Yes   Plan:  -Hold warfarin -Bridge with heparin gtt when INR < 2.5  -Daily INR   Agapito GamesAlison Lovel Suazo, PharmD, BCPS Clinical Pharmacist 10/07/2016 2:52 PM

## 2016-10-07 NOTE — Progress Notes (Signed)
Spoke to [facility], patient asking for iv pain medication, no new orders received, patient to continue with the pain medication that is ordered

## 2016-10-07 NOTE — Consult Note (Signed)
Referring Provider: H. Derrell Lolling, MD Primary Care Physician:  Ignatius Specking, MD Primary Gastroenterologist:  None (unassigned)  Reason for Consultation:  Diarrhea, questionable colitis on CT  HPI: [name] is a 55 y.o. female with a complicated history: She had a cholecystectomy 09/08/2016, complicated by a gallbladder fossa abscess requiring percutaneous drainage 09/21/2016, seen by my partner (Dr. Dorena Cookey) for nausea and vomiting and ongoing epigastric pain, for which she underwent EGD 09/30/2016 which showed distal esophagitis; the patient was also found to have a positive Helicobacter antibody, so treatment for that was recommended but as far as I can tell, was never ordered.   With that background, the patient has been having problems with diarrhea, even though she is no longer on antibiotics and is on probiotic therapy (Saccharomyces). 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 In addition, the patient has had leukocytosis, without fever, and has been seen by infectious diseases and for now observation rather than empiric antibiotic therapy is planned. The patient's white blood count is somewhat improved today.  The patient is reporting epigastric pain and is on around-the-clock narcotic analgesic pain medication.  Adding to the patient's complexity is the fact that she has had a mechanical mitral valve replacement, for which she is on chronic anticoagulation with Coumadin; her INR has been hovering just above the therapeutic range. Today, it is 3.6.  The patient reports 5 bowel movements today, which are loose or watery in character, but without blood. She has never had colonoscopic evaluation.     Past Medical History:  Diagnosis Date  . Anxiety   . Arthritis   . Asthma   . Cardiomyopathy (HCC)   . Chronic back pain   . COPD (chronic obstructive pulmonary disease) (HCC)   .  Essential hypertension   . Fibromyalgia   . History of TIA (transient ischemic attack)   . Hyperlipidemia   . Migraine    Recurrent  . Myocardial infarction, anterior wall (HCC) 2011   Late reperfusion occluded LAD treated with BMS complicated by catheter-induced left mainstem dissection also treated with BMS  . Panic attacks   . Rheumatic heart disease    St. Jude MVR 2007  . Spina bifida [facility])     Past Surgical History:  Procedure Laterality Date  . APPENDECTOMY    . CARDIAC CATHETERIZATION  06/2005; 11/2005   Hattie Perch 04/08/2011   . CHOLECYSTECTOMY N/A 09/08/2016   Procedure: LAPAROSCOPIC CHOLECYSTECTOMY WITH INTRAOPERATIVE CHOLANGIOGRAM;  Surgeon: Ovidio Kin, MD;  Location: [facility] OR;  Service: General;  Laterality: N/A;  . CORONARY ANGIOPLASTY WITH STENT PLACEMENT  06/2010   BMS to the mLAD (culprit vessel) and L main (which dissected during the procedure), non-obs dz CFX & RCA  . ESOPHAGOGASTRODUODENOSCOPY Left 09/30/2016   Procedure: ESOPHAGOGASTRODUODENOSCOPY (EGD);  Surgeon: Dorena Cookey, MD;  Location: [facility] ENDOSCOPY;  Service: Endoscopy;  Laterality: Left;  . MITRAL VALVE REPLACEMENT  02/2006   25 mm St. Jude mechanical prosthesis  . TUBAL LIGATION      Prior to Admission medications   Medication Sig Start Date End Date Taking? Authorizing Provider  carvedilol (COREG) 6.25 MG tablet Take 1 tablet (6.25 mg total) by mouth 2 (two) times daily with a meal. 09/12/16  Yes Shanker Levora Dredge, MD  HYDROcodone-acetaminophen (NORCO/VICODIN) 5-325 MG tablet Take 1 tablet by mouth 3 (three) times daily as needed for moderate pain. 09/12/16  Yes Shanker Levora Dredge,  MD  Melatonin 3 MG CAPS Take 3 mg by mouth at bedtime as needed (for sleep).   Yes Historical Provider, MD  promethazine (PHENERGAN) 25 MG tablet Take 25 mg by mouth 2 (two) times daily as needed for nausea or vomiting.  02/26/12  Yes Historical Provider, MD  warfarin (COUMADIN) 5 MG tablet Take 2.5 mg once on 10/21 Start taking your  usual regimen on 10/22 (7.5 mg by mouth on Monday and Thursday. Take 10mg  by mouth on all other days) Go to Dr Bonnita Levan office on 10/23 for INR check-and ask Dr Sherril Croon for further dosing instructions 09/12/16  Yes Shanker Levora Dredge, MD  NITROSTAT 0.4 MG SL tablet DISSOLVE ONE TABLET UNDER TONGUE EVERY 5 MINUTES UP TO 3 DOSES AS NEEDED FOR CHEST PAIN Patient taking differently: DISSOLVE 0.4 MG UNDER TONGUE EVERY 5 MINUTES UP TO 3 DOSES AS NEEDED FOR CHEST PAIN 03/22/15   Jonelle Sidle, MD    Current Facility-Administered Medications  Medication Dose Route Frequency Provider Last Rate Last Dose  . 0.9 %  sodium chloride infusion  250 mL Intravenous PRN Karie Soda, MD      . acetaminophen (TYLENOL) tablet 1,000 mg  1,000 mg Oral TID Karie Soda, MD   1,000 mg at 10/06/16 2213  . ALPRAZolam Prudy Feeler) tablet 0.5 mg  0.5 mg Oral QHS PRN Harriette Bouillon, MD   0.5 mg at 10/06/16 2353  . alum & mag hydroxide-simeth (MAALOX/MYLANTA) 200-200-20 MG/5ML suspension 30 mL  30 mL Oral Q4H PRN Manus Rudd, MD   30 mL at 09/25/16 0903  . carvedilol (COREG) tablet 25 mg  25 mg Oral BID WC Vassie Loll, MD   25 mg at 10/07/16 1720  . diphenoxylate-atropine (LOMOTIL) 2.5-0.025 MG per tablet 1 tablet  1 tablet Oral QID PRN Vassie Loll, MD   1 tablet at 10/03/16 1300  . enalapril (VASOTEC) tablet 5 mg  5 mg Oral BID Harriette Bouillon, MD   5 mg at 10/07/16 1032  . feeding supplement (BOOST / RESOURCE BREEZE) liquid 1 Container  1 Container Oral TID BM Gaynelle Adu, MD   1 Container at 10/07/16 1414  . feeding supplement (PRO-STAT SUGAR FREE 64) liquid 30 mL  30 mL Oral TID Sherrie George, PA-C   30 mL at 10/07/16 1720  . fentaNYL (SUBLIMAZE) injection 12.5 mcg  12.5 mcg Intravenous Q8H PRN Sherrie George, PA-C      . hydrALAZINE (APRESOLINE) injection 10 mg  10 mg Intravenous Q6H PRN Russella Dar, NP      . lactated ringers infusion   Intravenous Continuous Dorena Cookey, MD 10 mL/hr at 09/30/16 0955    . lip balm  (BLISTEX) ointment   Topical BID Karie Soda, MD      . magic mouthwash  15 mL Oral QID PRN Karie Soda, MD      . menthol-cetylpyridinium (CEPACOL) lozenge 3 mg  1 lozenge Oral PRN Karie Soda, MD      . metoprolol (LOPRESSOR) injection 5 mg  5 mg Intravenous Q6H PRN Ozella Rocks, MD   5 mg at 09/26/16 0148  . nitroGLYCERIN (NITROSTAT) SL tablet 0.4 mg  0.4 mg Sublingual Q5 min PRN Harriette Bouillon, MD      . ondansetron (ZOFRAN-ODT) disintegrating tablet 4 mg  4 mg Oral Q6H PRN Sherrie George, PA-C   4 mg at 10/07/16 1418  . oxyCODONE (Oxy IR/ROXICODONE) immediate release tablet 10 mg  10 mg Oral Q6H PRN Sherrie George, PA-C   10  mg at 10/07/16 1418  . pantoprazole (PROTONIX) EC tablet 40 mg  40 mg Oral BID Sherrie GeorgeWillard Jennings, PA-C   40 mg at 10/07/16 1031  . phenol (CHLORASEPTIC) mouth spray 2 spray  2 spray Mouth/Throat PRN Karie SodaSteven Gross, MD      . polyethylene glycol (MIRALAX / GLYCOLAX) packet 17 g  17 g Oral BID Bernette Redbirdobert Kirat Mezquita, MD   17 g at 10/07/16 1138  . saccharomyces boulardii (FLORASTOR) capsule 250 mg  250 mg Oral BID Karie SodaSteven Gross, MD   250 mg at 10/07/16 1029  . sodium chloride flush (NS) 0.9 % injection 3 mL  3 mL Intravenous Q12H Karie SodaSteven Gross, MD   3 mL at 10/07/16 1145  . sodium chloride flush (NS) 0.9 % injection 3 mL  3 mL Intravenous PRN Karie SodaSteven Gross, MD      . sucralfate (CARAFATE) 1 GM/10ML suspension 1 g  1 g Oral TID WC & HS Sherrie GeorgeWillard Jennings, PA-C   1 g at 10/07/16 1720  . Warfarin - Pharmacist Dosing Inpatient   Does not apply q1800 Gerilyn Nestlehuy D Dang, [facility] YardRPH   Stopped at 10/07/16 1800  . zolpidem (AMBIEN) tablet 5 mg  5 mg Oral QHS PRN Harriette Bouillonhomas Cornett, MD   5 mg at 10/05/16 2201    Allergies as of 09/15/2016 - Review Complete 09/15/2016  Allergen Reaction Noted  . Cyclobenzaprine hcl Nausea And Vomiting   . Latex Other (See Comments) 09/24/2012    Family History  Problem Relation Age of Onset  . Heart attack Father 5755  . Heart attack Mother 7472  . Coronary artery  disease Sister     Social History   Social History  . Marital status: Divorced    Spouse name: N/A  . Number of children: N/A  . Years of education: N/A   Occupational History  . Disabled    Social History Main Topics  . Smoking status: Former Smoker    Packs/day: 0.25    Years: 38.00    Types: Cigarettes    Quit date: 08/28/2016  . Smokeless tobacco: Never Used  . Alcohol use Yes     Comment: 09/03/2016 "drank some; quit after my 1st child was born in [id number]"  . Drug use: No  . Sexual activity: Not Currently    Birth control/ protection: Post-menopausal   Other Topics Concern  . Not on file   Social History Narrative   Lives in [place] with a boyfriend. She is currently disabled.     Review of Systems: Epigastric pain and diarrhea as per history of present illness. No problem with chronic headaches or focal neurologic deficits. No swallowing difficulties or upper GI tract symptoms at present such as nausea, vomiting, heartburn. No chest pain. No dyspnea. No skin rashes. No joint effusions. No sore throat. No urinary difficulties such as hematuria.   Physical Exam: Vital signs in last 24 hours: Temp:  [97.5 F (36.4 C)-98.2 F (36.8 C)] 97.5 F (36.4 C) (11/14 1228) Pulse Rate:  [66-80] 79 (11/14 1719) Resp:  [16-17] 17 (11/14 1228) BP: (109-119)/(53-63) 109/63 (11/14 1719) SpO2:  [98 %-99 %] 98 % (11/14 1719) Weight:  [62.1 kg (137 lb)] 62.1 kg (137 lb) (11/14 0700) Last BM Date: 10/07/16 General:   Alert,  Well-developed, well-nourished, pleasant and cooperative in NAD Head:  Normocephalic and atraumatic. Eyes:  Sclera clear, no icterus.   Conjunctiva pink. Mouth:   No ulcerations or lesions.  Oropharynx pink & moist. Neck:   No masses or thyromegaly. Lungs:  Clear throughout to auscultation.   No wheezes, crackles, or rhonchi. No evident respiratory distress. Heart:   Regular rate and rhythm;  crisp prosthetic mitral valve sound Abdomen:  Soft, nontender,  nontympanitic, and nondistended. No masses, hepatosplenomegaly or ventral hernias noted. Normal bowel sounds, without bruits, guarding, or rebound.   Msk:   Symmetrical without gross deformities. Extremities:   Without clubbing, cyanosis, or edema. Neurologic:  Alert and coherent;  grossly normal neurologically. Skin:  Intact without significant lesions or rashes. Cervical Nodes:  No significant cervical adenopathy. Psych:   Alert and cooperative. Normal mood and affect.  Intake/Output from previous day: No intake/output data recorded. Intake/Output this shift: Total I/O In: 320 [P.O.:320] Out: -   Lab Results:  Recent Labs  10/05/16 0313 10/06/16 0511 10/07/16 0452  WBC 20.5* 22.3* 17.9*  HGB 13.4 13.3 13.8  HCT 39.3 38.6 39.1  PLT 325 339 366   BMET  Recent Labs  10/05/16 0313 10/06/16 0511 10/07/16 0452  NA 135 131* 132*  K 2.5* 2.5* 2.9*  CL 99* 97* 99*  CO2 24 22 20*  GLUCOSE 132* 124* 143*  BUN 7 11 12   CREATININE 0.76 0.79 0.80  CALCIUM 8.9 9.0 9.0   LFT  Recent Labs  10/05/16 0313  PROT 6.7  ALBUMIN 2.7*  AST 27  ALT 23  ALKPHOS 98  BILITOT 0.6   PT/INR  Recent Labs  10/06/16 0511 10/07/16 0452  LABPROT 40.5* 36.7*  INR 3.81 3.59    Studies/Results: Ct Abdomen Pelvis W Contrast  Result Date: 10/05/2016 CLINICAL DATA:  Fever of unknown origin. Diffuse abdominal pain and nausea elevated white blood cell count EXAM: CT ABDOMEN AND PELVIS WITH CONTRAST TECHNIQUE: Multidetector CT imaging of the abdomen and pelvis was performed using the standard protocol following bolus administration of intravenous contrast. CONTRAST:  100mL ISOVUE-300 IOPAMIDOL (ISOVUE-300) INJECTION 61% COMPARISON:  09/25/2016 FINDINGS: Lower chest: Lung bases are clear. Hepatobiliary: Small amount of gas and fluid in the gallbladder fossa following cholecystectomy is felt within normal limits. Interval removal of the percutaneous drainage catheter. There is no intrahepatic  duct dilatation. Common bile duct normal caliber. Pancreas: No pancreatic inflammation.  No duct dilatation per Spleen: Normal spleen Adrenals/urinary tract: Adrenal glands and kidneys are normal. The ureters and bladder normal. Stomach/Bowel: The stomach, duodenum and jejunum are normal. The ileum is normal. No evidence of small dilatation or wall thickening. Terminal ileum appears normal. Appendix not identified. There is loss of definition of the transverse colon haustral markings. Mild thickening of the bowel wall (image 20, series 2). This occurs over approximately 15 cm segment. Bowel wall measures 4 mm in thickness (image 65, series 5). No pneumatosis. Descending colon sigmoid colon are normal. Vascular/Lymphatic: Intimal calcification of the abdominal aorta. 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 IMA is patent Reproductive: Uterus and ovaries normal Other: No free fluid. Musculoskeletal: No aggressive osseous lesion. IMPRESSION: 1. New segment of bowel wall thickening involving the transverse colon. Concern for mild / early ischemic colitis. 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 2. Small amount of fluid in the gallbladder fossa without evidence of progression. These results will be called to the ordering clinician or representative by the Radiologist Assistant, and communication documented in the PACS or zVision Dashboard. Electronically Signed   By: Genevive BiStewart  Edmunds M.D.   On: 10/05/2016 20:26    Impression:  1. Diarrhea 2. Epigastric pain 3. Radiographic abnormality of transverse colon, possible colitis. Differential diagnosis would include artifact, infection, or colonic mucosal ischemia (which could potentially correlate with her SMA obstruction seen radiographically) 4. Status post mechanical mitral valve replacement, on chronic  anticoagulation   Plan: I believe the patient would benefit from colonoscopic evaluation to check for colitis as well as for screening purposes. The nature, purpose, and risks of the procedure were discussed with the patient and she is agreeable to proceed.   The exam can be done with her on therapeutic anticoagulation; ideally, I would like to see her INR approximately 3 or lower before proceeding.   Since she is having diarrhea, an aggressive additional prep is probably not needed, especially since we are looking primarily for diffuse mucosal abnormalities or significant polyps. I confirmed with the patient that she does not feel the need or the desire to undergo an aggressive Nu-lytely prep so we will continue MiraLAX as a mild, adjunctive agent to help rinse the colon and optimize visualization without putting the patient, who is already having diarrhea, through the burden of a major clean-out.   I will make the patient  NPO after midnight so that we can potentially proceed with colonoscopy tomorrow, if her INR drops to an acceptable range.    LOS: 22 days   Jalyiah Shelley V  10/07/2016, 5:57 PM   Pager [phone] If no answer or after 5 PM call [phone]

## 2016-10-08 LAB — PROTIME-INR
INR: 3.31
Prothrombin Time: 34.4 seconds — ABNORMAL HIGH (ref 11.4–15.2)

## 2016-10-08 LAB — BASIC METABOLIC PANEL
Anion gap: 10 (ref 5–15)
BUN: 11 mg/dL (ref 6–20)
CHLORIDE: 103 mmol/L (ref 101–111)
CO2: 19 mmol/L — AB (ref 22–32)
Calcium: 8.6 mg/dL — ABNORMAL LOW (ref 8.9–10.3)
Creatinine, Ser: 0.62 mg/dL (ref 0.44–1.00)
GFR calc Af Amer: >60 mL/min (ref >60)
GFR calc non Af Amer: >60 mL/min (ref >60)
GLUCOSE: 104 mg/dL — AB (ref 65–99)
POTASSIUM: 3.3 mmol/L — AB (ref 3.5–5.1)
SODIUM: 132 mmol/L — AB (ref 135–145)

## 2016-10-08 LAB — CBC
HEMATOCRIT: 35.4 % — AB (ref 36.0–46.0)
HEMOGLOBIN: 12.2 g/dL (ref 12.0–15.0)
MCH: 31.9 pg (ref 26.0–34.0)
MCHC: 34.5 g/dL (ref 30.0–36.0)
MCV: 92.4 fL (ref 78.0–100.0)
Platelets: 327 10*3/uL (ref 150–400)
RBC: 3.83 MIL/uL — AB (ref 3.87–5.11)
RDW: 13.8 % (ref 11.5–15.5)
WBC: 14.5 10*3/uL — ABNORMAL HIGH (ref 4.0–10.5)

## 2016-10-08 MED ORDER — FLEET ENEMA 7-19 GM/118ML RE ENEM
1.0000 | ENEMA | Freq: Once | RECTAL | Status: AC
  Administered 2016-10-09: 1 via RECTAL
  Filled 2016-10-08: qty 1

## 2016-10-08 MED ORDER — POLYETHYLENE GLYCOL 3350 17 G PO PACK
17.0000 g | PACK | Freq: Four times a day (QID) | ORAL | Status: AC
  Administered 2016-10-08: 17 g via ORAL
  Filled 2016-10-08: qty 1

## 2016-10-08 MED ORDER — POTASSIUM CHLORIDE 2 MEQ/ML IV SOLN
Freq: Once | INTRAVENOUS | Status: AC
  Administered 2016-10-08: 12:00:00 via INTRAVENOUS
  Filled 2016-10-08: qty 1000

## 2016-10-08 MED ORDER — PROMETHAZINE HCL 25 MG/ML IJ SOLN
12.5000 mg | Freq: Once | INTRAMUSCULAR | Status: AC
  Administered 2016-10-08: 12.5 mg via INTRAVENOUS
  Filled 2016-10-08: qty 1

## 2016-10-08 NOTE — Progress Notes (Signed)
Patient ID: [name], female   DOB: 1961/11/13, 55 y.o.   MRN: [id number]  [facility]  8 Days Post-Op  Subjective: Feeling better today. Denies any current abdominal pain, nausea, vomiting. She has had 2 loose BM's today. Unable to have colonoscopy today due to INR, maybe tomorrow.  Objective: Vital signs in last 24 hours: Temp:  [97.5 F (36.4 C)-98.1 F (36.7 C)] 98.1 F (36.7 C) (11/15 0604) Pulse Rate:  [72-79] 72 (11/15 0604) Resp:  [16-17] 16 (11/15 0604) BP: (92-115)/(41-63) 106/51 (11/15 0604) SpO2:  [98 %] 98 % (11/15 0604) Weight:  [138 lb 1.6 oz (62.6 kg)] 138 lb 1.6 oz (62.6 kg) (11/15 0500) Last BM Date: 10/07/16  Intake/Output from previous day: 11/14 0701 - 11/15 0700 In: 320 [P.O.:320] Out: -  Intake/Output this shift: No intake/output data recorded.  PE: Gen: Alert, NAD, depressed Card: RRR Pulm: CTAB Abd: well healing lap incisions, Soft, ND, +BS, NT  Lab Results:   Recent Labs  10/07/16 0452 10/08/16 0247  WBC 17.9* 14.5*  HGB 13.8 12.2  HCT 39.1 35.4*  PLT 366 327   BMET  Recent Labs  10/07/16 0452 10/08/16 0247  NA 132* 132*  K 2.9* 3.3*  CL 99* 103  CO2 20* 19*  GLUCOSE 143* 104*  BUN 12 11  CREATININE 0.80 0.62  CALCIUM 9.0 8.6*   PT/INR  Recent Labs  10/07/16 0452 10/08/16 0247  LABPROT 36.7* 34.4*  INR 3.59 3.31   CMP     Component Value Date/Time   NA 132 (L) 10/08/2016 0247   K 3.3 (L) 10/08/2016 0247   CL 103 10/08/2016 0247   CO2 19 (L) 10/08/2016 0247   GLUCOSE 104 (H) 10/08/2016 0247   BUN 11 10/08/2016 0247   CREATININE 0.62 10/08/2016 0247   CALCIUM 8.6 (L) 10/08/2016 0247   PROT 6.7 10/05/2016 0313   ALBUMIN 2.7 (L) 10/05/2016 0313   AST 27 10/05/2016 0313   ALT 23 10/05/2016 0313   ALKPHOS 98 10/05/2016 0313   BILITOT 0.6 10/05/2016 0313   GFRNONAA >60 10/08/2016 0247   GFRAA >60 10/08/2016 0247   Lipase  No results found for:  LIPASE     Studies/Results: No results found.  Anti-infectives: Anti-infectives    Start     Dose/Rate Route Frequency Ordered Stop   09/30/16 1300  amoxicillin (AMOXIL) capsule 500 mg  Status:  Discontinued     500 mg Oral Every 12 hours 09/30/16 1236 10/07/16 1053   09/30/16 1300  clarithromycin (BIAXIN) tablet 500 mg  Status:  Discontinued     500 mg Oral Every 12 hours 09/30/16 1236 10/07/16 1053   09/15/16 2230  ciprofloxacin (CIPRO) IVPB 400 mg  Status:  Discontinued     400 mg 200 mL/hr over 60 Minutes Intravenous Every 12 hours 09/15/16 2215 09/17/16 1249       Assessment/Plan Abdominal pain, Nausea, diarrhea S/p laparoscopic cholecystectomy10/16/17, Dr. Ezzard StandingNewman Negative HIDA 09/16/16 IR placement of drain 09/21/16- no growth on fluid cultured - drain removed 09/29/16 EGD 09/30/16: La grade C reflux esophagitis and duodenitis - on PO protonix, carafate H Pylori is positive antibody Leukocytosis - WBC trending down, 14.5 today from 17.9, afebrile - CT: concern for mild / early ischemic colitis - appreciate ID and GI recommdations. Planning for colonoscopy once INR lower Hypokalemia - 3.3 >> replete  [name] and left main stent on coumadin  - holding coumadin prior to procedure. INR 3.31 today  ID- UA negative x2, CXR normal, c diff negative. No longer on any antibiotics FEN - clears today and NPO after midnight for procedure VTE - holding heparin  Plan - No abdominal pain today. Plan for colonoscopy tomorrow with GI as long as INR is closer to 3. NPO after midnight, clears today. Continue to hold coumadin. Continue probiotic. WBC is trending down again today. Replacing potassium.  Labs in AM   LOS: 23 days    [name] , [facility] 10/08/2016, 10:24 AM Pager: [phone] Consults: [phone] Mon-Fri 7:00 am-4:30 pm Sat-Sun 7:00 am-11:30 am

## 2016-10-08 NOTE — Progress Notes (Addendum)
ANTICOAGULATION CONSULT NOTE  Pharmacy Consult for warfarin Indication: MVR  Allergies  Allergen Reactions  . Cyclobenzaprine Hcl Nausea And Vomiting  . Latex Other (See Comments)    Unknown   Patient Measurements: Height: 5\' 4"  (162.6 cm) Weight: 138 lb 1.6 oz (62.6 kg) IBW/kg (Calculated) : 54.7  Vital Signs: Temp: 98.1 F (36.7 C) (11/15 0604) Temp Source: Oral (11/15 0604) BP: 106/51 (11/15 0604) Pulse Rate: 72 (11/15 0604)  Labs:  Recent Labs  10/06/16 0511 10/07/16 0452 10/08/16 0247  HGB 13.3 13.8 12.2  HCT 38.6 39.1 35.4*  PLT 339 366 327  LABPROT 40.5* 36.7* 34.4*  INR 3.81 3.59 3.31  CREATININE 0.79 0.80 0.62    Estimated Creatinine Clearance: 69.4 mL/min (by C-G formula based on SCr of 0.62 mg/dL).  Assessment: 55y on warfarin for history of MVR.  INR currently 3.3, therapeutic. Pt has been on amoxicillin/clarithromycin (dc'd yesterday) for H pylori - clarithromycin has major interaction with warfarin and is likely the cause for her labile INRs. CBC indicates a stable H/H and no noted bleeding complications. Discussed plan with surgery team, hold warfarin to allow INR to come down for colonoscopy.    PTA warfarin dose: 10mg  daily exc for 7.5mg  on Mon/Thurs   Goal of Therapy:  INR 2.5-3.5 Monitor platelets by anticoagulation protocol: Yes    Plan:  -Hold warfarin -Bridge with heparin gtt when INR < 2.5  -Daily INR   Agapito GamesAlison Adaleena Mooers, PharmD, BCPS Clinical Pharmacist 10/08/2016 8:44 AM    Addendum -Discussed with surgery, will begin heparin gtt when INR < 2.5  Baldemar FridayMasters, Orvis Stann M  10/08/2016 11:01 AM

## 2016-10-08 NOTE — Progress Notes (Signed)
    [facility]   Reason for visit: Follow up on leukocytosis  Interval History: much better spirits today, less pain, colonoscopy today  Physical Exam: Constitutional:  Vitals:   10/08/16 0604 10/08/16 1300  BP: (!) 106/51 (!) 110/51  Pulse: 72 72  Resp: 16 18  Temp: 98.1 F (36.7 C) 98.4 F (36.9 C)   patient appears in NAD Respiratory: Normal respiratory effort; CTA B Cardiovascular: RRR GI: soft, no rebound or guarding  Review of Systems: Constitutional: negative for fevers and chills Respiratory: negative for cough or pleurisy/chest pain Integument/breast: negative for rash Musculoskeletal: negative for myalgias and arthralgias  Lab Results  Component Value Date   WBC 14.5 (H) 10/08/2016   HGB 12.2 10/08/2016   HCT 35.4 (L) 10/08/2016   MCV 92.4 10/08/2016   PLT 327 10/08/2016    Lab Results  Component Value Date   CREATININE 0.62 10/08/2016   BUN 11 10/08/2016   NA 132 (L) 10/08/2016   K 3.3 (L) 10/08/2016   CL 103 10/08/2016   CO2 19 (L) 10/08/2016    Lab Results  Component Value Date   ALT 23 10/05/2016   AST 27 10/05/2016   ALKPHOS 98 10/05/2016     Microbiology: Recent Results (from the past 240 hour(s))  C difficile quick scan w PCR reflex     Status: None   Collection Time: 10/06/16 12:47 PM  Result Value Ref Range Status   C Diff antigen NEGATIVE NEGATIVE Final   C Diff toxin NEGATIVE NEGATIVE Final   C Diff interpretation No C. difficile detected.  Final    Impression/Plan:  1. Leukocytosis - improved more today.  No source of infection.  2. Colitis - colonoscopy today.    I will sign off, please call with any questions.

## 2016-10-08 NOTE — Progress Notes (Signed)
GASTROENTEROLOGY PROGRESS NOTE  Problem:   Diarrhea. Epigastric pain. Possible transverse colitis by CT scan. Mitral valve replacement with chronic anticoagulation.  Subjective: Diarrhea better. Epigastric pain improved.  Objective: Slight subjective epigastric tenderness without guarding or peritoneal findings. No mass effect.  INR improved at 3.3  Assessment: Clinical improvement; INR still higher than optimal for colonoscopic evaluation.  Plan: Tentative colonoscopy tomorrow, assuming that INR will be stable or improved. Since diarrhea has resolved, will order a fleets enema for tomorrow morning; have increased MiraLAX dosing.  [name], M.D. 10/08/2016 4:37 PM  Pager [phone] If no answer or after 5 PM call [phone]

## 2016-10-09 ENCOUNTER — Inpatient Hospital Stay (HOSPITAL_COMMUNITY): Payer: Medicaid Other | Admitting: Certified Registered Nurse Anesthetist

## 2016-10-09 ENCOUNTER — Encounter (HOSPITAL_COMMUNITY): Payer: Self-pay | Admitting: Certified Registered Nurse Anesthetist

## 2016-10-09 ENCOUNTER — Encounter (HOSPITAL_COMMUNITY): Admission: EM | Disposition: A | Discharge status: Home / Self Care | Payer: Self-pay | Source: Home / Self Care

## 2016-10-09 HISTORY — PX: COLONOSCOPY WITH PROPOFOL: SHX5780

## 2016-10-09 LAB — BASIC METABOLIC PANEL
Anion gap: 10 (ref 5–15)
BUN: 9 mg/dL (ref 6–20)
CALCIUM: 8.6 mg/dL — AB (ref 8.9–10.3)
CHLORIDE: 105 mmol/L (ref 101–111)
CO2: 17 mmol/L — ABNORMAL LOW (ref 22–32)
CREATININE: 0.74 mg/dL (ref 0.44–1.00)
Glucose, Bld: 191 mg/dL — ABNORMAL HIGH (ref 65–99)
Potassium: 4.2 mmol/L (ref 3.5–5.1)
SODIUM: 132 mmol/L — AB (ref 135–145)

## 2016-10-09 LAB — CBC
HCT: 37.2 % (ref 36.0–46.0)
HEMATOCRIT: 37 % (ref 36.0–46.0)
HEMOGLOBIN: 12.8 g/dL (ref 12.0–15.0)
HEMOGLOBIN: 12.8 g/dL (ref 12.0–15.0)
MCH: 31.6 pg (ref 26.0–34.0)
MCH: 31.8 pg (ref 26.0–34.0)
MCHC: 34.4 g/dL (ref 30.0–36.0)
MCHC: 34.6 g/dL (ref 30.0–36.0)
MCV: 91.9 fL (ref 78.0–100.0)
MCV: 91.8 fL (ref 78.0–100.0)
PLATELETS: 341 10*3/uL (ref 150–400)
Platelets: 310 10*3/uL (ref 150–400)
RBC: 4.05 MIL/uL (ref 3.87–5.11)
RBC: 4.03 MIL/uL (ref 3.87–5.11)
RDW: 13.7 % (ref 11.5–15.5)
RDW: 13.4 % (ref 11.5–15.5)
WBC: 17.9 10*3/uL — ABNORMAL HIGH (ref 4.0–10.5)
WBC: 22.6 10*3/uL — AB (ref 4.0–10.5)

## 2016-10-09 LAB — LACTIC ACID, PLASMA: LACTIC ACID, VENOUS: 0.9 mmol/L (ref 0.5–1.9)

## 2016-10-09 LAB — PROTIME-INR
INR: 2.19
PROTHROMBIN TIME: 24.7 s — AB (ref 11.4–15.2)

## 2016-10-09 LAB — HEPARIN LEVEL (UNFRACTIONATED): HEPARIN UNFRACTIONATED: 0.21 [IU]/mL — AB (ref 0.30–0.70)

## 2016-10-09 SURGERY — COLONOSCOPY WITH PROPOFOL
Anesthesia: Monitor Anesthesia Care

## 2016-10-09 MED ORDER — HEPARIN (PORCINE) IN NACL 100-0.45 UNIT/ML-% IJ SOLN
1150.0000 [IU]/h | INTRAMUSCULAR | Status: DC
  Administered 2016-10-09: 1000 [IU]/h via INTRAVENOUS
  Administered 2016-10-11 – 2016-10-15 (×6): 1150 [IU]/h via INTRAVENOUS
  Filled 2016-10-09 (×12): qty 250

## 2016-10-09 MED ORDER — LACTATED RINGERS IV SOLN
INTRAVENOUS | Status: DC
  Administered 2016-10-09: 1000 mL via INTRAVENOUS
  Administered 2016-10-09: 13:00:00 via INTRAVENOUS

## 2016-10-09 MED ORDER — SODIUM CHLORIDE 0.45 % IV SOLN
INTRAVENOUS | Status: DC
  Administered 2016-10-09: 16:00:00 via INTRAVENOUS
  Filled 2016-10-09 (×10): qty 1000

## 2016-10-09 MED ORDER — PROPOFOL 500 MG/50ML IV EMUL
INTRAVENOUS | Status: DC | PRN
  Administered 2016-10-09: 100 ug/kg/min via INTRAVENOUS

## 2016-10-09 MED ORDER — DIPHENHYDRAMINE HCL 50 MG/ML IJ SOLN
12.5000 mg | Freq: Three times a day (TID) | INTRAMUSCULAR | Status: DC | PRN
  Administered 2016-10-10: 12.5 mg via INTRAVENOUS
  Filled 2016-10-09: qty 1

## 2016-10-09 MED ORDER — FENTANYL CITRATE (PF) 100 MCG/2ML IJ SOLN
12.5000 ug | INTRAMUSCULAR | Status: DC | PRN
  Administered 2016-10-09 – 2016-10-13 (×13): 12.5 ug via INTRAVENOUS
  Filled 2016-10-09 (×13): qty 2

## 2016-10-09 MED ORDER — PIPERACILLIN-TAZOBACTAM 3.375 G IVPB
3.3750 g | Freq: Three times a day (TID) | INTRAVENOUS | Status: DC
  Administered 2016-10-09 – 2016-10-16 (×20): 3.375 g via INTRAVENOUS
  Filled 2016-10-09 (×24): qty 50

## 2016-10-09 MED ORDER — ONDANSETRON HCL 4 MG/2ML IJ SOLN
4.0000 mg | Freq: Four times a day (QID) | INTRAMUSCULAR | Status: DC | PRN
  Administered 2016-10-09 – 2016-10-16 (×22): 4 mg via INTRAVENOUS
  Filled 2016-10-09 (×22): qty 2

## 2016-10-09 MED ORDER — SODIUM CHLORIDE 0.9 % IV SOLN: INTRAVENOUS | Status: DC

## 2016-10-09 NOTE — Transfer of Care (Signed)
Immediate Anesthesia Transfer of Care Note  Patient: [name]  Procedure(s) Performed: Procedure(s): COLONOSCOPY WITH PROPOFOL (N/A)  Patient Location: PACU and Endoscopy Unit  Anesthesia Type:MAC  Level of Consciousness: patient cooperative and responds to stimulation  Airway & Oxygen Therapy: Patient Spontanous Breathing and Patient connected to nasal cannula oxygen  Post-op Assessment: Report given to RN and Post -op Vital signs reviewed and stable  Post vital signs: Reviewed and stable  Last Vitals:  Vitals:   10/09/16 1250 10/09/16 1407  BP: 129/74 109/64  Pulse: (!) 102 92  Resp: (!) 24 (!) 31  Temp: 36.7 C 37.3 C    Last Pain:  Vitals:   10/09/16 1407  TempSrc: Oral  PainSc:       Patients Stated Pain Goal: 3 (10/07/16 2053)  Complications: No apparent anesthesia complications

## 2016-10-09 NOTE — Progress Notes (Signed)
Pt. called RN in room re: another bloody stool in commode- sm.- med. size, and that she nauseated- told her I can give Zofran sl.; and will notify Dr.; Dr. Lindie SpruceWyatt paged and informed; stated that pt. will have colonoscopy today, and no new orders; pt. Informed.

## 2016-10-09 NOTE — Anesthesia Postprocedure Evaluation (Signed)
Anesthesia Post Note  Patient: [name]  Procedure(s) Performed: Procedure(s) (LRB): COLONOSCOPY WITH PROPOFOL (N/A)  Patient location during evaluation: PACU Anesthesia Type: MAC Level of consciousness: awake and alert Pain management: pain level controlled Vital Signs Assessment: post-procedure vital signs reviewed and stable Respiratory status: spontaneous breathing, nonlabored ventilation, respiratory function stable and patient connected to nasal cannula oxygen Cardiovascular status: stable and blood pressure returned to baseline Anesthetic complications: no    Last Vitals:  Vitals:   10/09/16 1435 10/09/16 1455  BP: 126/76 134/73  Pulse: 94 95  Resp: (!) 32 16  Temp:  37 C    Last Pain:  Vitals:   10/09/16 1455  TempSrc: Oral  PainSc: 9                  Mccayla Shimada,JAMES TERRILL

## 2016-10-09 NOTE — Progress Notes (Signed)
Surgery:  Colonoscopy and imaging findings reviewed. Ascending colon and proximal transverse colon looked very dusky, and I am not sure whether this can recover spontaneously  She clearly feels worse this afternoon.  Abdomen is soft but tender on the right with a little bit of guarding. 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  IMA is patent.  She may or may not require surgical resection within the next 24 hours. I informed her of this. For tonight, we'll hydrate, bowel rest, heparin drip. I'm going to put her on antibiotics Check lactic acid tonight and again tomorrow  Reassess early tomorrow morning Discussed situation with nursing staff, who will call with any deterioration in condition.   Angelia MouldHaywood M. Derrell LollingIngram, M.D., [facility] General and Minimally invasive Surgery Breast and Colorectal Surgery Office:   [phone]

## 2016-10-09 NOTE — Progress Notes (Signed)
Colonoscopy shows severe ischemic changes from the terminal ileum to roughly the midportion of the transverse colon. There are also, more minor, possible ischemic changes in the sigmoid region. There was small to moderate amount of fresh blood within the colonic lumen, consistent with a hemorrhagic component to the ischemic changes.  Biopsies are pending.  Mr. Brooke ButtonWill Jennings, PA-C, was notified of these findings.  The patient tolerated the procedure well.  The ischemic tissue did not look frankly gangrenous although it was somewhat violaceous in character, consistent with more severe ischemia.  Recommendations: Would consider follow-up CT, perhaps with CT angiography, as well as repeat labs and close clinical monitoring to help discern whether or not surgical intervention is needed.

## 2016-10-09 NOTE — Op Note (Addendum)
[facility] Patient Name: [name] Procedure Date : 10/09/2016 MRN: [id number] Attending MD: Bernette Redbird , MD Date of Birth: 06-26-1961 CSN: [id number] Age: 55 Admit Type: Inpatient Procedure:                Colonoscopy Indications:              Last colonoscopy: date unknown, Hematochezia,                            Abnormal CT of the GI tract suggesting colitis. Pt                            on coumadin s/p MVR, INR 2.2 today Providers:                Bernette Redbird, MD, Dwain Sarna, RN, [facility], Technician, Everlene Balls, CRNA Referring MD:              Medicines:                Propofol per Anesthesia Complications:            No immediate complications. Estimated Blood Loss:     Estimated blood loss was minimal from biopsies,                            despite being on coumadin; no evidence of                            persistent or excessive oozing at biopsy sites. Procedure:                Pre-Anesthesia Assessment:                           - Prior to the procedure, a History and Physical                            was performed, and patient medications and                            allergies were reviewed. The patient's tolerance of                            previous anesthesia was also reviewed. The risks                            and benefits of the procedure and the sedation                            options and risks were discussed with the patient.                            All questions were answered, and informed consent  was obtained. Prior Anticoagulants: The patient has                            taken Coumadin (warfarin), last dose was                            approximately 2 days prior to procedure. ASA Grade                            Assessment: III - A patient with severe systemic                            disease. After reviewing the risks and benefits,                             the patient was deemed in satisfactory condition to                            undergo the procedure.                           After obtaining informed consent, the colonoscope                            was passed under direct vision. Throughout the                            procedure, the patient's blood pressure, pulse, and                            oxygen saturations were monitored continuously. The                            EC-[id number] ([id number] scope was introduced through                            the anus and advanced to the the terminal ileum.                            The colonoscopy was somewhat difficult due to mild                            looping. Successful completion of the procedure was                            aided by using transient manual pressure. The                            patient tolerated the procedure well. The quality                            of the bowel preparation was adequate. The terminal  ileum and the ileocecal valve were photographed. Scope In: 1:25:02 PM Scope Out: 1:59:49 PM Scope Withdrawal Time: 0 hours 18 minutes 23 seconds  Total Procedure Duration: 0 hours 34 minutes 47 seconds  Findings:      Red blood (moderate amount) was found in the rectum, in the sigmoid       colon and in the descending colon.      A diffuse area of severely congested, erythematous, hemorrhagic and       vascular-pattern-decreased mucosa was found in the proximal transverse       colon and in the ascending colon and cecum, consistent with advanced       ischemia, but not frank gangrene. Biopsies were taken with a cold       forceps for histology, with minimal bleeding suggesting potentially       viable mucosa.      A patchy area of moderately congested and erythematous mucosa was found       in the sigmoid colon consistent with possible milder ischemic change in       that location. Biopsies were taken with a cold  forceps for histology.      Patchy inflammation, moderate in severity and characterized by       granularity and exudate with mucosal ulceration was found in the       terminal ileum. Biopsies were taken with a cold forceps for histology.      A few small-mouthed diverticula were found in the sigmoid colon.      There is no endoscopic evidence of mass or polyps in the entire colon       although small or focal lesions might have been missed due to the       presence of blood.      Retroflexion in rectum not performed. Impression:               - Blood in the rectum, in the sigmoid colon and in                            the descending colon.                           - Congested, erythematous, hemorrhagic and                            vascular-pattern-decreased mucosa in the transverse                            colon and in the ascending colon. Biopsied.                           - Congested and erythematous mucosa in the sigmoid                            colon. Biopsied.                           - Ischemic ileitis. Biopsied.                           - Diverticulosis in the sigmoid colon.                           -  OVERALL PICTURE SUGGESTIVE OF ISCHEMIC CHANGES,                            involving the TI and proximal colon (i.e., SMA                            distribution) although some distal ischemic changes                            may also be present. Moderate Sedation:      This patient was sedated with monitored anesthesia care, not moderate       sedation. Recommendation:           - Await pathology results.                           - Discussed findings with Will Marlyne BeardsJennings, PA-C, of                            the surgical service.                           - Repeat colonoscopy is not recommended for                            surveillance.                           - NPO.                           - Continue present medications. Procedure Code(s):        ---  Professional ---                           [id number], Colonoscopy, flexible; with biopsy, single                            or multiple Diagnosis Code(s):        --- Professional ---                           K92.2, Gastrointestinal hemorrhage, unspecified                           K55.9, Vascular disorder of intestine, unspecified                           R93.3, Abnormal findings on diagnostic imaging of                            other parts of digestive tract CPT copyright 2016 American [employer] Association. All rights reserved. The codes documented in this report are preliminary and upon coder review may  be revised to meet current compliance requirements. Bernette Redbirdobert Lem Peary, MD 10/09/2016 2:24:07 PM This report has been signed electronically. Number of Addenda: 0

## 2016-10-09 NOTE — Anesthesia Preprocedure Evaluation (Signed)
Anesthesia Evaluation  Patient identified by MRN, date of birth, ID band Patient awake    Reviewed: Allergy & Precautions, NPO status , Patient's Chart, lab work & pertinent test results  Airway Mallampati: II  TM Distance: >3 FB Neck ROM: Full    Dental  (+) Edentulous Upper, Edentulous Lower   Pulmonary asthma , COPD, former smoker,    breath sounds clear to auscultation       Cardiovascular hypertension, + CAD and + Past MI   Rhythm:Regular Rate:Normal     Neuro/Psych    GI/Hepatic   Endo/Other    Renal/GU Renal disease     Musculoskeletal  (+) Arthritis ,   Abdominal   Peds  Hematology   Anesthesia Other Findings   Reproductive/Obstetrics                             Anesthesia Physical Anesthesia Plan  ASA: III  Anesthesia Plan: MAC   Post-op Pain Management:    Induction: Intravenous  Airway Management Planned: Simple Face Mask and Natural Airway  Additional Equipment:   Intra-op Plan:   Post-operative Plan:   Informed Consent: I have reviewed the patients History and Physical, chart, labs and discussed the procedure including the risks, benefits and alternatives for the proposed anesthesia with the patient or authorized representative who has indicated his/her understanding and acceptance.   Dental advisory given  Plan Discussed with: CRNA  Anesthesia Plan Comments:         Anesthesia Quick Evaluation

## 2016-10-09 NOTE — Progress Notes (Signed)
Patients sister called and said patient did not want to have the procedure today, I stated I would go in and talk to the patient, patient stated she was concerned about her INR level was at a 4 and having the procedure, I explained to the patient that her INR is 2.19 and the physician wanted it below a 3 to have the colonoscopy and that her potassium is at a 4.2. Patient stated she would still have the colonoscopy.

## 2016-10-09 NOTE — Progress Notes (Signed)
Pt. C/O's feeling bad- states she vomited in bathroom and back to bed now; spitting clear spit/mucus in trash can and c/o's increase abdominal pain; had Zofran po and Oxyir at 1711; told pt. I will notify Dr.; Dr. Lindie SpruceWyatt paged and informed of pt.'s status and order given for Phenergan x1 IV; pt. Informed.

## 2016-10-09 NOTE — Progress Notes (Addendum)
ANTICOAGULATION CONSULT NOTE  Pharmacy Consult for warfarin/heparin when INR < 2.5 Indication: MVR  Allergies  Allergen Reactions  . Cyclobenzaprine Hcl Nausea And Vomiting  . Latex Other (See Comments)    Unknown   Patient Measurements: Height: 5\' 4"  (162.6 cm) Weight: 138 lb 1.6 oz (62.6 kg) IBW/kg (Calculated) : 54.7  Vital Signs: Temp: 98.9 F (37.2 C) (11/16 0552) Temp Source: Oral (11/16 0552) BP: 140/73 (11/16 0552) Pulse Rate: 120 (11/16 0552)  Labs:  Recent Labs  10/07/16 0452 10/08/16 0247 10/09/16 0504  HGB 13.8 12.2 12.8  HCT 39.1 35.4* 37.2  PLT 366 327 341  LABPROT 36.7* 34.4* 24.7*  INR 3.59 3.31 2.19  CREATININE 0.80 0.62 0.74    Estimated Creatinine Clearance: 69.4 mL/min (by C-G formula based on SCr of 0.74 mg/dL).  Assessment: 55y on warfarin for history of MVR.  INR down to 2.19  Pt had been on amoxicillin/clarithromycin for H pylori - clarithromycin has major interaction with warfarin and is likely the cause for her labile INRs. CBC indicates a stable H/H and no noted bleeding complications. Discussed plan with surgery PA-C, hold heparin drip start since colonoscopy on schedule for today. Hope to resume coumadin after procedure.   PTA warfarin dose: 10mg  daily exc for 7.5mg  on Mon/Thurs  Goal of Therapy:  INR 2.5-3.5 Monitor platelets by anticoagulation protocol: Yes   Plan: will not start heparin yet due to scheduled procedure -Hold warfarin INR is now < 2.5 but pt on schedule for colonoscopy for 1300 today -Daily INR - CCS to re-order heparin drip vs coumadin after procedure as needed  Herby AbrahamMichelle T. Breahna Boylen, Pharm.D. [phone] 10/09/2016 9:07 AM

## 2016-10-09 NOTE — Progress Notes (Signed)
Pharmacy Antibiotic Note  [name] is a 55 y.o. female admitted on 09/15/2016 with possible bowel perforation or surgical infection s/p gallbladder removal, now with bowel ischemia, might need resection. Pharmacy has been consulted for zosyn dosing. crcl ~ 70 ml/min, afebrile, wbc 17.9K   Plan: - zosyn 3.375g IV Q 8 hrs (4 hr infection) - monitor renal function  Height: 5\' 4"  (162.6 cm) Weight: 138 lb 1.6 oz (62.6 kg) IBW/kg (Calculated) : 54.7  Temp (24hrs), Avg:98.4 F (36.9 C), Min:97.8 F (36.6 C), Max:99.1 F (37.3 C)   Recent Labs Lab 10/05/16 0313 10/06/16 0511 10/07/16 0452 10/08/16 0247 10/09/16 0504  WBC 20.5* 22.3* 17.9* 14.5* 17.9*  CREATININE 0.76 0.79 0.80 0.62 0.74    Estimated Creatinine Clearance: 69.4 mL/min (by C-G formula based on SCr of 0.74 mg/dL).    Allergies  Allergen Reactions  . Cyclobenzaprine Hcl Nausea And Vomiting  . Latex Other (See Comments)    Unknown    Antimicrobials this admission: Cipro PTA >> 10/25 11/7 amoxicillin > 11/14 11/7 clarithro > 11/14  Dose adjustments this admission:  Microbiology results: 10/25 Cdiff - negative 10/29 abd abscess: ngF 11/2 Blood cx: neg 11/2 Urine cx: mult species, recollect 11/10 H Pylori IgG: 2.8 11/14 cdiff: neg  Thank you for allowing pharmacy to be a part of this patient's care.  Bayard HuggerMei Gurleen Larrivee, PharmD, BCPS  Clinical Pharmacist  Pager: [phone]   10/09/2016 4:33 PM

## 2016-10-09 NOTE — Progress Notes (Signed)
Pt must be completely NPO per day RN from MD. All oral medications will be placed on hold. Pt understands and states that MD spoke with her about this matter.

## 2016-10-09 NOTE — Progress Notes (Signed)
Patient ID: Brooke BoschMarlana W Holst, female   DOB: Feb 14, 1961, 55 y.o.   MRN: [id number]  [facility]  9 Days Post-Op  Subjective: Abdominal pain has returned. She also reports having a bloody BM this morning. Denies current n/v. Scheduled for colonoscopy later today.  Objective: Vital signs in last 24 hours: Temp:  [97.8 F (36.6 C)-98.9 F (37.2 C)] 98.9 F (37.2 C) (11/16 0552) Pulse Rate:  [72-120] 120 (11/16 0552) Resp:  [16-20] 18 (11/16 0552) BP: (110-166)/(51-75) 140/73 (11/16 0552) SpO2:  [97 %-99 %] 97 % (11/16 0552) Last BM Date: 10/09/16  Intake/Output from previous day: 11/15 0701 - 11/16 0700 In: 360 [P.O.:360] Out: -  Intake/Output this shift: No intake/output data recorded.  PE: Gen: Alert, NAD, depressed Pulm: effort normal Abd: patient lying on her side and declines to role over for me to examine her abdomen.   Lab Results:   Recent Labs  10/08/16 0247 10/09/16 0504  WBC 14.5* 17.9*  HGB 12.2 12.8  HCT 35.4* 37.2  PLT 327 341   BMET  Recent Labs  10/08/16 0247 10/09/16 0504  NA 132* 132*  K 3.3* 4.2  CL 103 105  CO2 19* 17*  GLUCOSE 104* 191*  BUN 11 9  CREATININE 0.62 0.74  CALCIUM 8.6* 8.6*   PT/INR  Recent Labs  10/08/16 0247 10/09/16 0504  LABPROT 34.4* 24.7*  INR 3.31 2.19   CMP     Component Value Date/Time   NA 132 (L) 10/09/2016 0504   K 4.2 10/09/2016 0504   CL 105 10/09/2016 0504   CO2 17 (L) 10/09/2016 0504   GLUCOSE 191 (H) 10/09/2016 0504   BUN 9 10/09/2016 0504   CREATININE 0.74 10/09/2016 0504   CALCIUM 8.6 (L) 10/09/2016 0504   PROT 6.7 10/05/2016 0313   ALBUMIN 2.7 (L) 10/05/2016 0313   AST 27 10/05/2016 0313   ALT 23 10/05/2016 0313   ALKPHOS 98 10/05/2016 0313   BILITOT 0.6 10/05/2016 0313   GFRNONAA >60 10/09/2016 0504   GFRAA >60 10/09/2016 0504   Lipase  No results found for: LIPASE     Studies/Results: No results found.  Anti-infectives: Anti-infectives    Start     Dose/Rate Route Frequency Ordered Stop   09/30/16 1300  amoxicillin (AMOXIL) capsule 500 mg  Status:  Discontinued     500 mg Oral Every 12 hours 09/30/16 1236 10/07/16 1053   09/30/16 1300  clarithromycin (BIAXIN) tablet 500 mg  Status:  Discontinued     500 mg Oral Every 12 hours 09/30/16 1236 10/07/16 1053   09/15/16 2230  ciprofloxacin (CIPRO) IVPB 400 mg  Status:  Discontinued     400 mg 200 mL/hr over 60 Minutes Intravenous Every 12 hours 09/15/16 2215 09/17/16 1249       Assessment/Plan Abdominal pain, Nausea, diarrhea S/p laparoscopic cholecystectomy10/16/17, Dr. Ezzard StandingNewman Negative HIDA 09/16/16 IR placement of drain 09/21/16- no growth on fluid cultured - drain removed 09/29/16 EGD 09/30/16: La grade C reflux esophagitis and duodenitis - on PO protonix, carafate H Pylori is positive antibody Leukocytosis - WBC 17.9 - CT: concern for mild / early ischemic colitis - appreciate ID and GI recommdations. 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 INR 2.19  ID- UA negative x2, CXR normal, c diff negative. No longer on any antibiotics FEN - clears today and NPO after  midnight for procedure VTE - holding heparin  Plan - colonoscopy at 1pm today. Plan to restart coumadin after procedure.   LOS: 24 days    Edson SnowballBROOKE A MILLER , [facility] 10/09/2016, 11:05 AM Pager: [phone] Consults: [phone] Mon-Fri 7:00 am-4:30 pm Sat-Sun 7:00 am-11:30 am

## 2016-10-09 NOTE — Progress Notes (Addendum)
IV Phenergan given and pt. wanted to wait on 10pm meds- per pt. If she she takes any of them; talked with pt. about taking meds for stomach and bp med. Pt. Had sm- med stool with bld. noted in commode. Told pt. that she's having colonoscopy t'morrow, and will let Dr. know about stool and esp. if she has another one.

## 2016-10-09 NOTE — Progress Notes (Signed)
ANTICOAGULATION CONSULT NOTE - Follow Up Consult  Pharmacy Consult for heparin Indication: acute mesenteric ischemia  Allergies  Allergen Reactions  . Cyclobenzaprine Hcl Nausea And Vomiting  . Latex Other (See Comments)    Unknown    Patient Measurements: Height: 5\' 4"  (162.6 cm) Weight: 138 lb 1.6 oz (62.6 kg) IBW/kg (Calculated) : 54.7 Heparin Dosing Weight: 62.6 kg  Vital Signs: Temp: 98.6 F (37 C) (11/16 1455) Temp Source: Oral (11/16 1455) BP: 134/73 (11/16 1455) Pulse Rate: 95 (11/16 1455)  Labs:  Recent Labs  10/07/16 0452 10/08/16 0247 10/09/16 0504  HGB 13.8 12.2 12.8  HCT 39.1 35.4* 37.2  PLT 366 327 341  LABPROT 36.7* 34.4* 24.7*  INR 3.59 3.31 2.19  CREATININE 0.80 0.62 0.74    Estimated Creatinine Clearance: 69.4 mL/min (by C-G formula based on SCr of 0.74 mg/dL).  Assessment: 55 YOF on warfarin PTA for history of MVR. INR down to 2.19 today. Pt is now s/p colonoscopy, and found to have acute mesenteric ischemia, pharmacy is consulted to start IV heparin now. Some bleeding noted in colonic lumen per colonoscopy note, CBC has been stable.  Goal of Therapy:  Heparin level 0.3-0.7 units/ml Monitor platelets by anticoagulation protocol: Yes   Plan:  Heparin infusion 1000 units/hr with no bolus F/u 6 hr heparin level at 2200 Daily heparin level and CBC Monitor s/sx of bleeding closely  Bayard HuggerMei Gelsey Amyx, PharmD, BCPS  Clinical Pharmacist  Pager: [phone]   10/09/2016,3:11 PM

## 2016-10-09 NOTE — Progress Notes (Signed)
ANTICOAGULATION CONSULT NOTE - Follow Up Consult  Pharmacy Consult for heparin Indication: acute mesenteric ischemia  Allergies  Allergen Reactions  . Cyclobenzaprine Hcl Nausea And Vomiting  . Latex Other (See Comments)    Unknown    Patient Measurements: Height: 5\' 4"  (162.6 cm) Weight: 138 lb 1.6 oz (62.6 kg) IBW/kg (Calculated) : 54.7 Heparin Dosing Weight: 62.6 kg  Vital Signs: Temp: 98.8 F (37.1 C) (11/16 1951) Temp Source: Oral (11/16 1951) BP: 107/58 (11/16 1951) Pulse Rate: 98 (11/16 1951)  Labs:  Recent Labs  10/07/16 0452 10/08/16 0247 10/09/16 0504 10/09/16 1627 10/09/16 2157  HGB 13.8 12.2 12.8 12.8  --   HCT 39.1 35.4* 37.2 37.0  --   PLT 366 327 341 310  --   LABPROT 36.7* 34.4* 24.7*  --   --   INR 3.59 3.31 2.19  --   --   HEPARINUNFRC  --   --   --   --  0.21*  CREATININE 0.80 0.62 0.74  --   --     Estimated Creatinine Clearance: 69.4 mL/min (by C-G formula based on SCr of 0.74 mg/dL).  Assessment: 55 YOF on warfarin PTA for history of MVR. INR down to 2.19 today. Pt is now s/p colonoscopy, and found to have acute mesenteric ischemia, pharmacy is consulted to start IV heparin now. Some bleeding noted in colonic lumen per colonoscopy note, CBC has been stable.  Initial heparin level is subtherapeutic at 0.21. No reported bleeding.   Goal of Therapy:  Heparin level 0.3-0.7 units/ml Monitor platelets by anticoagulation protocol: Yes   Plan:  - Increase Heparin infusion to 1150 units/hr - Heparin level in am   Brooke SamplesAndy Caydee Sweeney, PharmD, BCPS 10/09/2016, 11:20 PM Pager: [phone]

## 2016-10-10 ENCOUNTER — Encounter (HOSPITAL_COMMUNITY): Payer: Self-pay | Admitting: Gastroenterology

## 2016-10-10 LAB — BASIC METABOLIC PANEL
Anion gap: 12 (ref 5–15)
BUN: 7 mg/dL (ref 6–20)
CALCIUM: 8 mg/dL — AB (ref 8.9–10.3)
CO2: 21 mmol/L — AB (ref 22–32)
CREATININE: 0.67 mg/dL (ref 0.44–1.00)
Chloride: 97 mmol/L — ABNORMAL LOW (ref 101–111)
GFR calc Af Amer: >60 mL/min (ref >60)
GFR calc non Af Amer: >60 mL/min (ref >60)
GLUCOSE: 112 mg/dL — AB (ref 65–99)
Potassium: 3.3 mmol/L — ABNORMAL LOW (ref 3.5–5.1)
Sodium: 130 mmol/L — ABNORMAL LOW (ref 135–145)

## 2016-10-10 LAB — CBC
HCT: 33.9 % — ABNORMAL LOW (ref 36.0–46.0)
Hemoglobin: 11.9 g/dL — ABNORMAL LOW (ref 12.0–15.0)
MCH: 31.9 pg (ref 26.0–34.0)
MCHC: 35.1 g/dL (ref 30.0–36.0)
MCV: 90.9 fL (ref 78.0–100.0)
PLATELETS: 270 10*3/uL (ref 150–400)
RBC: 3.73 MIL/uL — ABNORMAL LOW (ref 3.87–5.11)
RDW: 13.5 % (ref 11.5–15.5)
WBC: 21.5 10*3/uL — ABNORMAL HIGH (ref 4.0–10.5)

## 2016-10-10 LAB — PROTIME-INR
INR: 1.97
PROTHROMBIN TIME: 22.7 s — AB (ref 11.4–15.2)

## 2016-10-10 LAB — LACTIC ACID, PLASMA: Lactic Acid, Venous: 1.4 mmol/L (ref 0.5–1.9)

## 2016-10-10 LAB — HEPARIN LEVEL (UNFRACTIONATED)
HEPARIN UNFRACTIONATED: 0.47 [IU]/mL (ref 0.30–0.70)
Heparin Unfractionated: 0.56 IU/mL (ref 0.30–0.70)

## 2016-10-10 MED ORDER — LORAZEPAM 2 MG/ML IJ SOLN
0.5000 mg | Freq: Four times a day (QID) | INTRAMUSCULAR | Status: DC | PRN
  Administered 2016-10-10 – 2016-10-16 (×18): 0.5 mg via INTRAVENOUS
  Filled 2016-10-10 (×19): qty 1

## 2016-10-10 MED ORDER — SODIUM CHLORIDE 0.9 % IV SOLN
30.0000 meq | Freq: Once | INTRAVENOUS | Status: AC
  Administered 2016-10-10: 30 meq via INTRAVENOUS
  Filled 2016-10-10: qty 15

## 2016-10-10 NOTE — Progress Notes (Signed)
ANTICOAGULATION CONSULT NOTE - Follow Up Consult  Pharmacy Consult for heparin Indication: acute mesenteric ischemia  Allergies  Allergen Reactions  . Cyclobenzaprine Hcl Nausea And Vomiting  . Latex Other (See Comments)    Unknown    Patient Measurements: Height: 5\' 4"  (162.6 cm) Weight: 138 lb 14.4 oz (63 kg) IBW/kg (Calculated) : 54.7 Heparin Dosing Weight: 62.6 kg  Vital Signs: Temp: 98.6 F (37 C) (11/17 1254) Temp Source: Oral (11/17 1254) BP: 95/64 (11/17 1254) Pulse Rate: 85 (11/17 1254)  Labs:  Recent Labs  10/08/16 0247 10/09/16 0504 10/09/16 1627 10/09/16 2157 10/10/16 0620 10/10/16 1421  HGB 12.2 12.8 12.8  --  11.9*  --   HCT 35.4* 37.2 37.0  --  33.9*  --   PLT 327 341 310  --  270  --   LABPROT 34.4* 24.7*  --   --  22.7*  --   INR 3.31 2.19  --   --  1.97  --   HEPARINUNFRC  --   --   --  0.21* 0.47 0.56  CREATININE 0.62 0.74  --   --  0.67  --     Estimated Creatinine Clearance: 69.4 mL/min (by C-G formula based on SCr of 0.67 mg/dL).  Assessment: 55 YOF on warfarin PTA for history of MVR.  Pt is now s/p colonoscopy, and found to have acute mesenteric ischemia and on heparin drip per pharmacy.  Heparin level remains therapeutic at 0.56 on heparin drip at 1150 units/hr. INR 1.97, Hg 11.9. Had bloody BMs overnight from ischemic bowel but since has had BM without blood. CCS watching very closely.   Goal of Therapy:  Heparin level 0.3-0.7 units/ml Monitor platelets by anticoagulation protocol: Yes   Plan:  - continue Heparin infusion at 1150 units/hr - Heparin level and CBC in am   Herby AbrahamMichelle T. Wayne Wicklund, Pharm.D. [phone] 10/10/2016 3:04 PM

## 2016-10-10 NOTE — Progress Notes (Signed)
GASTROENTEROLOGY PROGRESS NOTE  Problem:   Ischemic colitis  Subjective: Feels perhaps a tiny bit better than yesterday, not noticeably worse.  Objective: Moderate abdominal tenderness to exam. I do not hear bowel sounds. Vital signs unremarkable so far.  White count is still significantly elevated at 21,500 but is minimally improved compared to yesterday afternoon, but higher than yesterday morning. Patient's lactate level remains normal.  Biopsies from yesterday's colonoscopy showed severe inflammation, consistent with ischemic (or for that matter, infectious) colitis, with severity of changes corresponding to the degree of endoscopic severity noted yesterday (proximal colon > TI > sigmoid).   Assessment: Severe ischemic colitis involving the proximal colon and some involvement of the terminal ileum, with some ischemic changes both endoscopically and by biopsy in the sigmoid region.  Plan: I have discussed the case with the patient and with Dr. Derrell LollingIngram of surgery.  The patient recognizes that we do not have a crystal ball but in my opinion, it makes more sense for surgical intervention under semielective circumstances, rather than waiting until she is more acutely ill because her condition progressed.  Florencia Reasonsobert V. Bronwen Pendergraft, M.D. 10/10/2016 12:44 PM  Pager [phone] If no answer or after 5 PM call [phone]

## 2016-10-10 NOTE — Progress Notes (Signed)
ANTICOAGULATION CONSULT NOTE - Follow Up Consult  Pharmacy Consult for heparin Indication: acute mesenteric ischemia  Labs:  Recent Labs  10/08/16 0247 10/09/16 0504 10/09/16 1627 10/09/16 2157 10/10/16 0620  HGB 12.2 12.8 12.8  --  11.9*  HCT 35.4* 37.2 37.0  --  33.9*  PLT 327 341 310  --  270  LABPROT 34.4* 24.7*  --   --  22.7*  INR 3.31 2.19  --   --  1.97  HEPARINUNFRC  --   --   --  0.21* 0.47  CREATININE 0.62 0.74  --   --  0.67     Assessment/Plan:  55yo female therapeutic on heparin after rate change. Will continue gtt at current rate and confirm stable with additional level.   Vernard GamblesVeronda Keron Koffman, PharmD, BCPS  10/10/2016,7:52 AM

## 2016-10-10 NOTE — Progress Notes (Signed)
1 Day Post-Op  Subjective: Awake and alert Having lots of diffuse abdominal pain.  Worse on right side when she moves. On heparin drip, nothing by mouth, IV Zosyn Had 2 small bloody bowel movements during night  Current labs reveal WBC 21,000, hemoglobin 11.9, INR 1.97, potassium 3.3, creatinine 0.67.  Lactic acid 1.4    Objective: Vital signs in last 24 hours: Temp:  [98.1 F (36.7 C)-99.1 F (37.3 C)] 98.8 F (37.1 C) (11/17 0423) Pulse Rate:  [92-102] 98 (11/17 0423) Resp:  [16-37] 16 (11/17 0423) BP: (105-134)/(58-76) 105/65 (11/17 0423) SpO2:  [98 %-100 %] 100 % (11/17 0423) Weight:  [63 kg (138 lb 14.4 oz)] 63 kg (138 lb 14.4 oz) (11/17 0423) Last BM Date: 10/09/16  Intake/Output from previous day: No intake/output data recorded. Intake/Output this shift: No intake/output data recorded.  General appearance: Alert.  Moderate distress.  Looks uncomfortable.  Skin warm and dry Lungs: Clear to auscultation bilaterally Abdomen: Tender with guarding, right greater than left.  Some percussion tenderness  Lab Results:   Recent Labs  10/09/16 1627 10/10/16 0620  WBC 22.6* 21.5*  HGB 12.8 11.9*  HCT 37.0 33.9*  PLT 310 270   BMET  Recent Labs  10/09/16 0504 10/10/16 0620  NA 132* 130*  K 4.2 3.3*  CL 105 97*  CO2 17* 21*  GLUCOSE 191* 112*  BUN 9 7  CREATININE 0.74 0.67  CALCIUM 8.6* 8.0*   PT/INR  Recent Labs  10/09/16 0504 10/10/16 0620  LABPROT 24.7* 22.7*  INR 2.19 1.97   ABG No results for input(s): PHART, HCO3 in the last 72 hours.  Invalid input(s): PCO2, PO2  Studies/Results: No results found.  Anti-infectives: Anti-infectives    Start     Dose/Rate Route Frequency Ordered Stop   10/09/16 1630  piperacillin-tazobactam (ZOSYN) IVPB 3.375 g     3.375 g 12.5 mL/hr over 240 Minutes Intravenous Every 8 hours 10/09/16 1629     09/30/16 1300  amoxicillin (AMOXIL) capsule 500 mg  Status:  Discontinued     500 mg Oral Every 12 hours  09/30/16 1236 10/07/16 1053   09/30/16 1300  clarithromycin (BIAXIN) tablet 500 mg  Status:  Discontinued     500 mg Oral Every 12 hours 09/30/16 1236 10/07/16 1053   09/15/16 2230  ciprofloxacin (CIPRO) IVPB 400 mg  Status:  Discontinued     400 mg 200 mL/hr over 60 Minutes Intravenous Every 12 hours 09/15/16 2215 09/17/16 1249      Assessment/Plan: s/p Procedure(s): COLONOSCOPY WITH PROPOFOL   Ischemic colitis.  Colonoscopic findings of severe ischemia with purplish dusky color of terminal ileum to mid transverse colon are very ominous I think she is at high risk for progressing to gangrene and possible perforation.  I have recommended proceeding with surgical intervention at this point due to degree of tenderness and leukocytosis. I explained that this might involve resection and anastomosis, resection and ileostomy, damage control with reoperation 2 days later if margins of resection are not obvious.  She is somewhat inclined to do this but does not want to make a decision until her daughter comes in.  We will discuss this later this morning. For now, continue bowel rest, IV antibiotics, and heparin drip  S/p laparoscopic cholecystectomy10/16/17, Dr. Ezzard StandingNewman Negative HIDA 09/16/16 IR placement of drain 09/21/16- no growth on fluid cultured - drain removed 09/29/16..  It is possible that this subhepatic fluid collection had nothing to do with her symptoms all along EGD 09/30/16:  La grade C reflux esophagitis and duodenitis - on PO protonix, carafate H Pylori is positive antibody- amoxicillin and Biaxin have been discontinued due to nausea.  Hypokalemia - 3.3 again. 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  Back on Zosyn because of ischemic colitis FEN -NPO VTE -heparin     LOS: 25 days    Sheridan Gettel M 10/10/2016

## 2016-10-10 NOTE — Care Management (Signed)
Case manager continues to monitor patient's progress and plan of care.

## 2016-10-10 NOTE — Progress Notes (Signed)
Nutrition Follow-up  DOCUMENTATION CODES:   Not applicable  INTERVENTION:  Diet advancement per MD as appropriate.   RD to continue to monitor.   NUTRITION DIAGNOSIS:   Inadequate oral intake related to poor appetite as evidenced by meal completion < 50%; ongoing  GOAL:   Patient will meet greater than or equal to 90% of their needs; not met  MONITOR:   PO intake, Supplement acceptance, Labs, Weight trends, Skin, I & O's  REASON FOR ASSESSMENT:   Malnutrition Screening Tool    ASSESSMENT:   55 y.o. 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 Lucia Gaskins, discharged on 10/20.  Readmitted from ED on 10/23 for abnormal CT completed at [facility], indicating possible post-op complications of bile leak vs infection.  CT abd on 10/27 showed fluid/gas collection in GB fossa indicating possible infection. Percutaneous drain placed in subhepatic gallbladder fossa fluid collection 10/29.  Pt with ischemic colitis. Per surgery MD, colonoscopic findings of severe ischemia with purplish dusky color of terminal ileum to mid transverse colon are very ominous- high risk for progressing to gangrene and possible perforation. Per MD note, pt with clinical improvement in her condition today and possibility that the colitis might be reversible. RD to continue to monitor. Recommend continuation of nutritional supplements if/when diet is advanced.   Diet Order:  Diet NPO time specified  Skin:   (Incision on abdomen)  Last BM:  11/17  Height:   Ht Readings from Last 1 Encounters:  09/30/16 '5\' 4"'$  (1.626 m)    Weight:   Wt Readings from Last 1 Encounters:  10/10/16 138 lb 14.4 oz (63 kg)    Ideal Body Weight:  54.5 kg  BMI:  Body mass index is 23.84 kg/m.  Estimated  Nutritional Needs:   Kcal:  1800-1900  Protein:  75-85 grams  Fluid:  1.8 - 1.9 L/day  EDUCATION NEEDS:   No education needs identified at this time  Corrin Parker, MS, RD, LDN Pager # [phone] After hours/ weekend pager # [phone]

## 2016-10-10 NOTE — Progress Notes (Signed)
Pt has had 1 episode of a bloody BM this morning. RN assessed pt and looked at stool. Toilet was mostly red from moderate amount of stool. Called [facility] to update on call PA. Awaiting call back

## 2016-10-10 NOTE — Progress Notes (Signed)
Pt had second bloody BM this morning. This was a small size BM with blood surround it in the toilet. PA has not called RN back. Will also pass this along to RN in the morning

## 2016-10-10 NOTE — Progress Notes (Signed)
Surgery:  Her daughter was planning to arrive at 12:00, but has still not arrived.  Interestingly, the patient feels and looks much better. States she had a small bowel movement that did not have blood in it. Stable and alert.  Moving around better Heart rate 85.  Temp 98.6.  BP 95/64.  SPO2 98% on room air. Abdomen softer.  Tenderness less.  No peritoneal signs.  Considering the clinical improvement in her condition, at least by vital signs in bedside exam, I am willing to reconsider the possibility that her ischemic colitis might be reversible. Continue antibiotics, heparin drip, and bowel rest Repeat lab and exam in a.m. It was stressed to her to let us know if the pain gets worse again.   Angelia MouldHaywood M. Derrell LollingIngram, M.D., [facility] General and Minimally invasive Surgery Breast and Colorectal Surgery Office:   [phone] Pager:   [phone]

## 2016-10-11 LAB — CBC
HEMATOCRIT: 31.9 % — AB (ref 36.0–46.0)
Hemoglobin: 11 g/dL — ABNORMAL LOW (ref 12.0–15.0)
MCH: 31.3 pg (ref 26.0–34.0)
MCHC: 34.5 g/dL (ref 30.0–36.0)
MCV: 90.9 fL (ref 78.0–100.0)
Platelets: 279 10*3/uL (ref 150–400)
RBC: 3.51 MIL/uL — ABNORMAL LOW (ref 3.87–5.11)
RDW: 13.7 % (ref 11.5–15.5)
WBC: 18.7 10*3/uL — ABNORMAL HIGH (ref 4.0–10.5)

## 2016-10-11 LAB — BASIC METABOLIC PANEL
Anion gap: 13 (ref 5–15)
BUN: 9 mg/dL (ref 6–20)
CHLORIDE: 98 mmol/L — AB (ref 101–111)
CO2: 22 mmol/L (ref 22–32)
CREATININE: 0.75 mg/dL (ref 0.44–1.00)
Calcium: 7.9 mg/dL — ABNORMAL LOW (ref 8.9–10.3)
GFR calc Af Amer: >60 mL/min (ref >60)
GFR calc non Af Amer: >60 mL/min (ref >60)
GLUCOSE: 89 mg/dL (ref 65–99)
Potassium: 2.8 mmol/L — ABNORMAL LOW (ref 3.5–5.1)
Sodium: 133 mmol/L — ABNORMAL LOW (ref 135–145)

## 2016-10-11 LAB — HEPARIN LEVEL (UNFRACTIONATED): Heparin Unfractionated: 0.49 IU/mL (ref 0.30–0.70)

## 2016-10-11 LAB — PROTIME-INR
INR: 2.09
Prothrombin Time: 23.8 seconds — ABNORMAL HIGH (ref 11.4–15.2)

## 2016-10-11 MED ORDER — POTASSIUM CHLORIDE 2 MEQ/ML IV SOLN
30.0000 meq | Freq: Once | INTRAVENOUS | Status: AC
  Administered 2016-10-11: 30 meq via INTRAVENOUS
  Filled 2016-10-11: qty 15

## 2016-10-11 MED ORDER — POTASSIUM CHLORIDE 2 MEQ/ML IV SOLN
INTRAVENOUS | Status: DC
  Administered 2016-10-11 – 2016-10-16 (×5): via INTRAVENOUS
  Filled 2016-10-11 (×20): qty 1000

## 2016-10-11 NOTE — Progress Notes (Signed)
ANTICOAGULATION CONSULT NOTE - Follow Up Consult  Pharmacy Consult for heparin Indication: acute mesenteric ischemia  Allergies  Allergen Reactions  . Cyclobenzaprine Hcl Nausea And Vomiting  . Latex Other (See Comments)    Unknown    Patient Measurements: Height: 5\' 4"  (162.6 cm) Weight: 139 lb 1.8 oz (63.1 kg) IBW/kg (Calculated) : Brooke.7 Heparin Dosing Weight: 62.6 kg  Vital Signs: Temp: 99 F (37.2 C) (11/18 0431) Temp Source: Oral (11/18 0431) BP: 106/59 (11/18 0431) Pulse Rate: 84 (11/18 0431)  Labs:  Recent Labs  10/09/16 0504 10/09/16 1627  10/10/16 0620 10/10/16 1421 10/11/16 0344  HGB 12.8 12.8  --  11.9*  --  11.0*  HCT 37.2 37.0  --  33.9*  --  31.9*  PLT 341 310  --  270  --  279  LABPROT 24.7*  --   --  22.7*  --  23.8*  INR 2.19  --   --  1.97  --  2.09  HEPARINUNFRC  --   --   < > 0.47 0.56 0.49  CREATININE 0.74  --   --  0.67  --  0.75  < > = values in this interval not displayed.  Estimated Creatinine Clearance: 69.4 mL/min (by C-G formula based on SCr of 0.75 mg/dL).  Assessment: [name] on warfarin PTA for history of MVR.  Pt is now s/p colonoscopy, and found to have acute mesenteric ischemia and on heparin drip per pharmacy.  Heparin level remains therapeutic at 0.49 on heparin drip at 1150 units/hr. INR 2.09 (last coumadin 11/3), Hg 11.0. Now having non-bloody loose stools, previously having bloody BMs due to ischemic bowel.  CCS watching very closely.   Goal of Therapy:  Heparin level 0.3-0.7 units/ml Monitor platelets by anticoagulation protocol: Yes   Plan:  - continue Heparin infusion at 1150 units/hr - Heparin level and CBC in am   Herby AbrahamMichelle T. Giovanna Kemmerer, Pharm.D. [phone] 10/11/2016 10:14 AM

## 2016-10-11 NOTE — Progress Notes (Signed)
2 Days Post-Op  Subjective: Abdominal pain surprisingly better Reports nonbloody loose stools Says she is very hungry  Remains on IV fluids, IV Zosyn, bowel rest, heparin drip Potassium 2.8.  Creatinine 0.75.  WBC 18,700, slightly less.  Hemoglobin 11.0.  Stable.  INR 2.09.  Glucose 89.  Objective: Vital signs in last 24 hours: Temp:  [98.6 F (37 C)-99 F (37.2 C)] 99 F (37.2 C) (11/18 0431) Pulse Rate:  [84-88] 84 (11/18 0431) Resp:  [18] 18 (11/18 0431) BP: (95-106)/(56-64) 106/59 (11/18 0431) SpO2:  [98 %-99 %] 98 % (11/18 0431) Weight:  [63.1 kg (139 lb 1.8 oz)] 63.1 kg (139 lb 1.8 oz) (11/18 0431) Last BM Date: 10/10/16  Intake/Output from previous day: 11/17 0701 - 11/18 0700 In: 809.8 [P.O.:100; I.V.:244.8; IV Piggyback:465] Out: -  Intake/Output this shift: No intake/output data recorded.  General appearance: Alert.  Appears comfortable and in no distress.  Mental status normal. Resp: clear to auscultation bilaterally GI: Much softer.  Not distended.  No localizing tenderness.  Minimally sore.  No guarding.  Lab Results:   Recent Labs  10/10/16 0620 10/11/16 0344  WBC 21.5* 18.7*  HGB 11.9* 11.0*  HCT 33.9* 31.9*  PLT 270 279   BMET  Recent Labs  10/10/16 0620 10/11/16 0344  NA 130* 133*  K 3.3* 2.8*  CL 97* 98*  CO2 21* 22  GLUCOSE 112* 89  BUN 7 9  CREATININE 0.67 0.75  CALCIUM 8.0* 7.9*   PT/INR  Recent Labs  10/10/16 0620 10/11/16 0344  LABPROT 22.7* 23.8*  INR 1.97 2.09   ABG No results for input(s): PHART, HCO3 in the last 72 hours.  Invalid input(s): PCO2, PO2  Studies/Results: No results found.  Anti-infectives: Anti-infectives    Start     Dose/Rate Route Frequency Ordered Stop   10/09/16 1630  piperacillin-tazobactam (ZOSYN) IVPB 3.375 g     3.375 g 12.5 mL/hr over 240 Minutes Intravenous Every 8 hours 10/09/16 1629     09/30/16 1300  amoxicillin (AMOXIL) capsule 500 mg  Status:  Discontinued     500 mg Oral Every  12 hours 09/30/16 1236 10/07/16 1053   09/30/16 1300  clarithromycin (BIAXIN) tablet 500 mg  Status:  Discontinued     500 mg Oral Every 12 hours 09/30/16 1236 10/07/16 1053   09/15/16 2230  ciprofloxacin (CIPRO) IVPB 400 mg  Status:  Discontinued     400 mg 200 mL/hr over 60 Minutes Intravenous Every 12 hours 09/15/16 2215 09/17/16 1249      Assessment/Plan: s/p Procedure(s): COLONOSCOPY WITH PROPOFOL  Ischemic colitis.  Colonoscopic findings of severe ischemia with purplish dusky color of terminal ileum to mid transverse colon are  Ominous, however clinically she has rapidly improved I do not think there is any immediate need for surgical intervention.. For now, allow limited clear liquids, continue IV antibiotics, and heparin drip  SMA occlusion.  Celiac stenosis.  If acute ischemic episode improves would involve vascular surgery.  S/p laparoscopic cholecystectomy10/16/17, Dr. Ezzard StandingNewman Negative HIDA 09/16/16 IR placement of drain 09/21/16- no growth on fluid cultured - drain removed 09/29/16..  It is possible that this subhepatic fluid collection had nothing to do with her symptoms all along EGD 09/30/16: Low grade  reflux esophagitis and duodenitis - on PO protonix, carafate H Pylori is positive antibody- amoxicillin and Biaxin have been discontinued due to nausea.  Hypokalemia - 3.3 again. 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  Back on Zosyn because of ischemic colitis FEN -NPO VTE -heparin   LOS: 26 days    [name] M 10/11/2016

## 2016-10-11 NOTE — Progress Notes (Signed)
Brooke BoschMarlana W Sweeney 10:42 AM  Subjective: Patient seen and examined and her hospital computer chart reviewed and her case discussed with my partner Dr. B as well as Dr. Derrell LollingIngram and she might feel a little better and does have a little bit of pain and is moving her bowels and is expected to be started on clear liquids by the surgical team but has no new complaints  Objective: Vital signs stable afebrile white count still elevated abdomen is soft occasional bowel sound no guarding or rebound not obviously tender endoscopic pictures reviewed biopsies show hemorrhagic necrosis  Assessment: Ischemic colitis slowly improving  Plan: Sips of clear liquids per surgical team will continue to follow no other recommendations at this time  [facility],[name]  Pager [phone] After 5PM or if no answer call [phone]

## 2016-10-12 LAB — PROTIME-INR
INR: 2.3
PROTHROMBIN TIME: 25.7 s — AB (ref 11.4–15.2)

## 2016-10-12 LAB — CBC
HCT: 31.2 % — ABNORMAL LOW (ref 36.0–46.0)
Hemoglobin: 10.3 g/dL — ABNORMAL LOW (ref 12.0–15.0)
MCH: 30.4 pg (ref 26.0–34.0)
MCHC: 33 g/dL (ref 30.0–36.0)
MCV: 92 fL (ref 78.0–100.0)
PLATELETS: 290 10*3/uL (ref 150–400)
RBC: 3.39 MIL/uL — AB (ref 3.87–5.11)
RDW: 13.8 % (ref 11.5–15.5)
WBC: 11.6 10*3/uL — AB (ref 4.0–10.5)

## 2016-10-12 LAB — BASIC METABOLIC PANEL
ANION GAP: 10 (ref 5–15)
BUN: 7 mg/dL (ref 6–20)
CALCIUM: 7.8 mg/dL — AB (ref 8.9–10.3)
CO2: 21 mmol/L — ABNORMAL LOW (ref 22–32)
CREATININE: 0.76 mg/dL (ref 0.44–1.00)
Chloride: 101 mmol/L (ref 101–111)
Glucose, Bld: 112 mg/dL — ABNORMAL HIGH (ref 65–99)
Potassium: 3.3 mmol/L — ABNORMAL LOW (ref 3.5–5.1)
SODIUM: 132 mmol/L — AB (ref 135–145)

## 2016-10-12 LAB — PREALBUMIN: PREALBUMIN: 8.9 mg/dL — AB (ref 18–38)

## 2016-10-12 LAB — HEPARIN LEVEL (UNFRACTIONATED): HEPARIN UNFRACTIONATED: 0.42 [IU]/mL (ref 0.30–0.70)

## 2016-10-12 MED ORDER — SODIUM CHLORIDE 0.9 % IV SOLN
30.0000 meq | Freq: Once | INTRAVENOUS | Status: AC
  Administered 2016-10-12: 30 meq via INTRAVENOUS
  Filled 2016-10-12: qty 15

## 2016-10-12 NOTE — Progress Notes (Signed)
Patient doing much better but was in the bathroom during my visit and case discussed with the surgery team who are going to advance her diet and recommend a vascular surgery consult early next week and her white count is decreased and no other obvious complaints and I will ask my partner to check on tomorrow

## 2016-10-12 NOTE — Progress Notes (Addendum)
ANTICOAGULATION& ANTIBIOTIC CONSULT NOTE - Follow Up Consult  Pharmacy Consult for heparin and zosyn Indication: acute mesenteric ischemia  Allergies  Allergen Reactions  . Cyclobenzaprine Hcl Nausea And Vomiting  . Latex Other (See Comments)    Unknown    Patient Measurements: Height: 5\' 4"  (162.6 cm) Weight: 139 lb 3.2 oz (63.1 kg) IBW/kg (Calculated) : 54.7 Heparin Dosing Weight: 62.6 kg  Vital Signs: Temp: 98.6 F (37 C) (11/19 0430) Temp Source: Oral (11/19 0430) BP: 94/49 (11/19 0836) Pulse Rate: 68 (11/19 0836)  Labs:  Recent Labs  10/10/16 0620 10/10/16 1421 10/11/16 0344 10/12/16 0531  HGB 11.9*  --  11.0* 10.3*  HCT 33.9*  --  31.9* 31.2*  PLT 270  --  279 290  LABPROT 22.7*  --  23.8* 25.7*  INR 1.97  --  2.09 2.30  HEPARINUNFRC 0.47 0.56 0.49 0.42  CREATININE 0.67  --  0.75 0.76    Estimated Creatinine Clearance: 69.4 mL/min (by C-G formula based on SCr of 0.76 mg/dL).  Assessment: 55 YOF on warfarin PTA for history of MVR.  Pt is now s/p colonoscopy, and found to have acute mesenteric ischemia and on heparin drip per pharmacy.  Heparin level remains therapeutic at 0.42 on heparin drip at 1150 units/hr. INR 2.3 (last coumadin 11/13), Hg 10.3. Now having non-bloody loose stools, previously having bloody BMs due to ischemic bowel.  CCS watching very closely.  Zosyn day # 4 for ischemic colitis.  WBC down to 11.6, AF, creat stable.  Goal of Therapy:  Heparin level 0.3-0.7 units/ml Monitor platelets by anticoagulation protocol: Yes   Plan:  - continue Heparin infusion at 1150 units/hr - Heparin level and CBC in am  - per [facility] note, consider restarting coumadin tomorrow - continue zosyn 3.375 q8h, 4 hr infusion  Herby AbrahamMichelle T. Angelys Yetman, Pharm.D. [phone] 10/12/2016 10:03 AM

## 2016-10-12 NOTE — Progress Notes (Signed)
3 Days Post-Op  Subjective: Feels fine.  Denies pain or nausea Small non-bloody bowel movements Tolerating clear liquids, but doesn't like taste.  Certainly no increase in pain with eating  WBC down to 11,600.  Hemoglobin stable at 10.3.  Potassium 3.3.  Pre-albumin 8.9.  Objective: Vital signs in last 24 hours: Temp:  [98.6 F (37 C)] 98.6 F (37 C) (11/19 0430) Pulse Rate:  [68-82] 68 (11/19 0836) Resp:  [16-18] 16 (11/19 0836) BP: (94-106)/(49-57) 94/49 (11/19 0836) SpO2:  [97 %-99 %] 99 % (11/19 0836) Weight:  [63.1 kg (139 lb 3.2 oz)] 63.1 kg (139 lb 3.2 oz) (11/19 0430) Last BM Date: 10/10/16  Intake/Output from previous day: 11/18 0701 - 11/19 0700 In: 7213.3 [I.V.:7013.3; IV Piggyback:200] Out: -  Intake/Output this shift: No intake/output data recorded.  General appearance: Alert and cooperative.  Looks like she feels much better. Resp: clear to auscultation bilaterally GI: Soft.  Essentially nontender.  No mass.  Lab Results:   Recent Labs  10/11/16 0344 10/12/16 0531  WBC 18.7* 11.6*  HGB 11.0* 10.3*  HCT 31.9* 31.2*  PLT 279 290   BMET  Recent Labs  10/11/16 0344 10/12/16 0531  NA 133* 132*  K 2.8* 3.3*  CL 98* 101  CO2 22 21*  GLUCOSE 89 112*  BUN 9 7  CREATININE 0.75 0.76  CALCIUM 7.9* 7.8*   PT/INR  Recent Labs  10/11/16 0344 10/12/16 0531  LABPROT 23.8* 25.7*  INR 2.09 2.30   ABG No results for input(s): PHART, HCO3 in the last 72 hours.  Invalid input(s): PCO2, PO2  Studies/Results: No results found.  Anti-infectives: Anti-infectives    Start     Dose/Rate Route Frequency Ordered Stop   10/09/16 1630  piperacillin-tazobactam (ZOSYN) IVPB 3.375 g     3.375 g 12.5 mL/hr over 240 Minutes Intravenous Every 8 hours 10/09/16 1629     09/30/16 1300  amoxicillin (AMOXIL) capsule 500 mg  Status:  Discontinued     500 mg Oral Every 12 hours 09/30/16 1236 10/07/16 1053   09/30/16 1300  clarithromycin (BIAXIN) tablet 500 mg   Status:  Discontinued     500 mg Oral Every 12 hours 09/30/16 1236 10/07/16 1053   09/15/16 2230  ciprofloxacin (CIPRO) IVPB 400 mg  Status:  Discontinued     400 mg 200 mL/hr over 60 Minutes Intravenous Every 12 hours 09/15/16 2215 09/17/16 1249      Assessment/Plan: s/p Procedure(s): COLONOSCOPY WITH PROPOFOL  Ischemic colitis. Colonoscopic findings of severe ischemia with purplish dusky color of terminal ileum to mid transverse colon are  Ominous, however clinically she has rapidly improved I do not think there is any immediate need for surgical intervention.. For now, advance to full liquids continue IV antibiotics, and heparin drip Discussed with Dr. Ewing SchleinMagod GI follow-up in one month would be appropriate to evaluate for longtime sequelae such as stricture.  SMA occlusion.  Celiac stenosis.  If acute ischemic episode improves would involve vascular surgery.  S/p laparoscopic cholecystectomy10/16/17, Dr. Ezzard StandingNewman Negative HIDA 09/16/16 IR placement of drain 09/21/16- no growth on fluid cultured - drain removed 09/29/16.. It is possible that this subhepatic fluid collection had nothing to do with her symptoms all along EGD 09/30/16: Low grade  reflux esophagitis and duodenitis - on PO protonix, carafate H Pylori is positive antibody-amoxicillin and Biaxin have been discontinued due to nausea.  Hypokalemia - 3.3 again. 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 Back on Zosyn because of ischemic colitis FEN -NPO VTE -heparin   LOS: 27 days    [name] M 10/12/2016

## 2016-10-13 DIAGNOSIS — K55059 Acute (reversible) ischemia of intestine, part and extent unspecified: Secondary | ICD-10-CM

## 2016-10-13 LAB — BASIC METABOLIC PANEL
ANION GAP: 8 (ref 5–15)
BUN: <5 mg/dL — ABNORMAL LOW (ref 6–20)
CALCIUM: 7.9 mg/dL — AB (ref 8.9–10.3)
CO2: 21 mmol/L — AB (ref 22–32)
CREATININE: 0.75 mg/dL (ref 0.44–1.00)
Chloride: 104 mmol/L (ref 101–111)
GLUCOSE: 97 mg/dL (ref 65–99)
Potassium: 4.3 mmol/L (ref 3.5–5.1)
Sodium: 133 mmol/L — ABNORMAL LOW (ref 135–145)

## 2016-10-13 LAB — HEPARIN LEVEL (UNFRACTIONATED): HEPARIN UNFRACTIONATED: 0.64 [IU]/mL (ref 0.30–0.70)

## 2016-10-13 LAB — PROTIME-INR
INR: 2.03
Prothrombin Time: 23.3 seconds — ABNORMAL HIGH (ref 11.4–15.2)

## 2016-10-13 MED ORDER — WARFARIN - PHARMACIST DOSING INPATIENT: Freq: Every day | Status: DC

## 2016-10-13 MED ORDER — WARFARIN SODIUM 5 MG PO TABS: 5.0000 mg | ORAL_TABLET | Freq: Once | ORAL | Status: DC

## 2016-10-13 NOTE — Progress Notes (Signed)
[facility]  Jackquline BoschMarlana W Dearman 55 y.o. 06-30-1961  CC:  Abdominal pain   Subjective: Patient doing fine. No acute issues. Tolerating diet. Some nausea but denied any vomiting. Abdominal pain resolved. Diarrhea resolved  ROS : Negative for abdominal pain and diarrhea. Positive for nausea   Objective: Vital signs in last 24 hours: Vitals:   10/12/16 2037 10/13/16 0729  BP: (!) 105/55 (!) 115/58  Pulse: 69 75  Resp:    Temp: 99.1 F (37.3 C) 98.8 F (37.1 C)    Physical Exam:  General:  Alert, cooperative, no distress, appears stated age  Head:  Normocephalic, without obvious abnormality, atraumatic  Eyes:  , EOM's intact,   Lungs:   Clear to auscultation bilaterally, respirations unlabored  Heart:  Regular rate and rhythm, Mechanical click noted  Abdomen:   Soft, non-tender, bowel sounds active all four quadrants,  no masses,   Extremities: Extremities normal, atraumatic, no  edema       Lab Results:  Recent Labs  10/12/16 0531 10/13/16 0335  NA 132* 133*  K 3.3* 4.3  CL 101 104  CO2 21* 21*  GLUCOSE 112* 97  BUN 7 <5*  CREATININE 0.76 0.75  CALCIUM 7.8* 7.9*   No results for input(s): AST, ALT, ALKPHOS, BILITOT, PROT, ALBUMIN in the last 72 hours.  Recent Labs  10/11/16 0344 10/12/16 0531  WBC 18.7* 11.6*  HGB 11.0* 10.3*  HCT 31.9* 31.2*  MCV 90.9 92.0  PLT 279 290    Recent Labs  10/12/16 0531 10/13/16 0332  LABPROT 25.7* 23.3*  INR 2.30 2.03      Assessment/Plan: - Severe  Ischemic colitis. Colonoscopy 10/09/2016( Dr. Matthias HughsBuccini) showed severe ischemic changes involving terminal ileum up to proximal transverse colon - Chronic SMA occlusion and celiac stenosis - History of  MVR. On Heparin drip for now   Recommendations ------------------------- - Continue antibiotics for total 14 days for severe ischemic colitis - Patient may benefit for vascular surgery evaluation - Advance diet slowly as tolerated - Follow-up  with Dr. Matthias HughsBuccini in 3-4 weeks after discharge - GI will sign off. Call us back if needed   Kathi DerParag Altus Zaino MD, FACP 10/13/2016, 8:42 AM  Pager [phone]  If no answer or after 5 PM call [phone]

## 2016-10-13 NOTE — Progress Notes (Addendum)
ANTICOAGULATION CONSULT NOTE - Follow Up Consult  Pharmacy Consult for heparin Indication: acute mesenteric ischemia  Allergies  Allergen Reactions  . Cyclobenzaprine Hcl Nausea And Vomiting  . Latex Other (See Comments)    Unknown    Patient Measurements: Height: 5\' 4"  (162.6 cm) Weight: 139 lb 3.2 oz (63.1 kg) IBW/kg (Calculated) : 54.7 Heparin Dosing Weight: 62.6 kg  Vital Signs: Temp: 98.8 F (37.1 C) (11/20 0729) Temp Source: Oral (11/20 0729) BP: 115/58 (11/20 0729) Pulse Rate: 75 (11/20 0729)  Labs:  Recent Labs  10/11/16 0344 10/12/16 0531 10/13/16 0332 10/13/16 0335  HGB 11.0* 10.3*  --   --   HCT 31.9* 31.2*  --   --   PLT 279 290  --   --   LABPROT 23.8* 25.7* 23.3*  --   INR 2.09 2.30 2.03  --   HEPARINUNFRC 0.49 0.42 0.64  --   CREATININE 0.75 0.76  --  0.75    Estimated Creatinine Clearance: 69.4 mL/min (by C-G formula based on SCr of 0.75 mg/dL).  Assessment: 55 YOF on warfarin PTA for history of MVR.  Pt is now s/p colonoscopy, and found to have acute mesenteric ischemia and on heparin drip per pharmacy.   Heparin level remains therapeutic at 0.64 on heparin drip at 1150 units/hr. INR remains subtherapeutic at 2.03 (last warfarin 11/13), Hg 10.3. Advancing diet as tolerated, currently on full liquid diet started today. Restarting warfarin today.  PTA home dose: warfarin 10mg  daily exc for 7.5mg  on Mon/Thurs   Goal of Therapy:  INR 2.5-3.5 Heparin level 0.3-0.7 units/ml Monitor platelets by anticoagulation protocol: Yes   Plan:  Continue Heparin infusion at 1150 units/hr Give warfarin 5 mg dose x 1 Daily heparin level Monitor daily INR, CBC, clinical course, s/sx of bleed, PO intake, DDI   Thank you for allowing us to participate in this patients care. Signe Coltonya C Maan Zarcone, PharmD Pager: [phone] 10/13/2016 1:15 PM

## 2016-10-13 NOTE — Progress Notes (Signed)
Patient ID: [name], female   DOB: 01-06-1961, 55 y.o.   MRN: [id number]  [facility]  4 Days Post-Op  Subjective: Feeling better today. Denies abdominal pain, nausea, or vomiting. Tolerating full liquids. Having regular BM's.   Objective: Vital signs in last 24 hours: Temp:  [98.8 F (37.1 C)-99.1 F (37.3 C)] 98.8 F (37.1 C) (11/20 0729) Pulse Rate:  [69-75] 75 (11/20 0729) BP: (105-115)/(55-58) 115/58 (11/20 0729) SpO2:  [98 %] 98 % (11/20 0729) Last BM Date: 10/10/16  Intake/Output from previous day: No intake/output data recorded. Intake/Output this shift: No intake/output data recorded.  PE: Gen:  Alert, NAD, cooperative, up walking in room Pulm:  CTAB, effort normal Abd: Soft, NT/ND, +BS, no HSM  Lab Results:   Recent Labs  10/11/16 0344 10/12/16 0531  WBC 18.7* 11.6*  HGB 11.0* 10.3*  HCT 31.9* 31.2*  PLT 279 290   BMET  Recent Labs  10/12/16 0531 10/13/16 0335  NA 132* 133*  K 3.3* 4.3  CL 101 104  CO2 21* 21*  GLUCOSE 112* 97  BUN 7 <5*  CREATININE 0.76 0.75  CALCIUM 7.8* 7.9*   PT/INR  Recent Labs  10/12/16 0531 10/13/16 0332  LABPROT 25.7* 23.3*  INR 2.30 2.03   CMP     Component Value Date/Time   NA 133 (L) 10/13/2016 0335   K 4.3 10/13/2016 0335   CL 104 10/13/2016 0335   CO2 21 (L) 10/13/2016 0335   GLUCOSE 97 10/13/2016 0335   BUN <5 (L) 10/13/2016 0335   CREATININE 0.75 10/13/2016 0335   CALCIUM 7.9 (L) 10/13/2016 0335   PROT 6.7 10/05/2016 0313   ALBUMIN 2.7 (L) 10/05/2016 0313   AST 27 10/05/2016 0313   ALT 23 10/05/2016 0313   ALKPHOS 98 10/05/2016 0313   BILITOT 0.6 10/05/2016 0313   GFRNONAA >60 10/13/2016 0335   GFRAA >60 10/13/2016 0335   Lipase  No results found for: LIPASE     Studies/Results: No results found.  Anti-infectives: Anti-infectives    Start     Dose/Rate Route Frequency Ordered Stop   10/09/16 1630  piperacillin-tazobactam (ZOSYN) IVPB 3.375 g     3.375 g 12.5 mL/hr over 240 Minutes Intravenous Every 8 hours 10/09/16 1629     09/30/16 1300  amoxicillin (AMOXIL) capsule 500 mg  Status:  Discontinued     500 mg Oral Every 12 hours 09/30/16 1236 10/07/16 1053   09/30/16 1300  clarithromycin (BIAXIN) tablet 500 mg  Status:  Discontinued     500 mg Oral Every 12 hours 09/30/16 1236 10/07/16 1053   09/15/16 2230  ciprofloxacin (CIPRO) IVPB 400 mg  Status:  Discontinued     400 mg 200 mL/hr over 60 Minutes Intravenous Every 12 hours 09/15/16 2215 09/17/16 1249       Assessment/Plan S/p laparoscopic cholecystectomy10/16/17, Dr. Ezzard StandingNewman EGD 09/30/16: La grade C reflux esophagitis and duodenitis H Pylori is positive antibody-amoxicillin and Biaxin have been discontinued due to nausea. Ischemic colitis.  - Colonoscopy 11/16: severe ischemic changes from the terminal ileum to roughly the midportion of the transverse colon; more minor, possible ischemic changes in the sigmoid region - biopsy: HEMORRHAGIC NECROSIS - tolerating full liquids - per GI: Continue antibiotics for total 14 days for severe ischemic colitis; Patient may benefit from vascular surgery evaluation; Advance diet slowly as tolerated; Follow-up with Dr. Matthias HughsBuccini in 3-4 weeks after discharge  SMA occlusion. Celiac stenosis.  - consult placed to vascular surgery  Hypokalemia - 4.3, resolved  [name] and left main stent on coumadin  - restarting coumadin today per pharmacy  History myocardial infarction  ID- zosyn 11/16>> (day #5/14) FEN - soft VTE -heparin  Plan - advance to soft diet. Restart coumadin per pharmacy. Vascular surgery consulted for SMA occlusion.   LOS: 28 days    Edson SnowballBROOKE A Sweeney , [facility] 10/13/2016, 11:22 AM Pager: [phone] Consults: [phone] Mon-Fri 7:00 am-4:30 pm Sat-Sun 7:00 am-11:30 am

## 2016-10-13 NOTE — Progress Notes (Signed)
ANTICOAGULATION CONSULT NOTE - Follow Up Consult  Pharmacy Consult for warfarin Indication: acute mesenteric ischemia  Assessment: 55 YOF on warfarin PTA for history of MVR.  Pt is now s/p colonoscopy, and found to have acute mesenteric ischemia and on heparin drip per pharmacy.   Initial plan was to continue heparin and restart warfarin this evening (11/20).  Pt now offered and considering aortogram for 11/21.  Per PA, hold warfarin tonight and continue heparin.   Goal of Therapy:  Heparin level 0.3-0.7 units/ml Monitor platelets by anticoagulation protocol: Yes   Plan:  Continue Heparin infusion at 1150 units/hr Discontinue warfarin order. Follow-up plans for procedure tomorrow +/- restarting warfarin. Daily heparin level Monitor daily INR, CBC, clinical course, s/sx of bleed, PO intake, DDI   Thank you for allowing us to participate in this patients care. Toys 'R' UsKimberly Rhone Ozaki, Pharm.D., BCPS Clinical Pharmacist Pager [phone] 10/13/2016 4:10 PM

## 2016-10-13 NOTE — Consult Note (Signed)
Hospital Consult    Reason for Consult:  SMA occlusion Referring Physician:  General Surgery MRN #:  [id number]  History of Present Illness: This is a 55 y.o. female who presented initially on 09/03/16 with c/o nausea, [id number], abdominal pain and fever to [facility].  She was told that it was most likely her gallbladder and recommended cholecystectomy.  She stated she [facility] given the fact she has a hx of a mitral valve replacement (mechanical) to make sure it was safe to proceed.  She underwent laparoscopic cholecystectomy on 09/08/16 by Dr. Ezzard StandingNewman.  She states that after surgery, she was feeling better with no further abdominal pain or N/V.  She was restarted on her coumadin via heparin bridge and discharged home on 09/12/16.   She states that 2-3 days later, she had recurrent abdominal pain (epigastric) with nausea and vomiting and [facility] straight [facility].  She was admitted for IVF and started on IV abx.  She did have a HIDA scan and this was negative for post operative bile leak.  She did have some loose stools and her C diff was negative.  She did have an elevated WBC at the highest was 29k.  She was requiring increased narcotics but this has decreased.   She was started on a PPI.  She remains on Zosyn.  She did have a plain film with ileus on 09/25/16 and underwent a non contrasted CT scan.  It remained unclear what the etiology of the fever and increasing leukocytosis was given the fluid cx was negative.  ID was consulted.  Blood cx were negative.   She did undergo IR guided drainage of subhepatic fluid collection on 09/21/16.  Per the pt, this was negative.  She did have a bump in her creatinine to 1.8 on 09/25/16, which was felt to be due to dehydration.  She was started on IVF and this did resolve.  By 09/29/16, she continued to be symptomatic & a GI consult was obtained.  She underwent EGD and was found to have LA Grade C reflux  esophagitis, duodenitis.  IV Protonix was recommended.  On 10/03/16, her WBC started trending upward again.  She had continued complaints of epigastric pain.  She was continuing to be treated for H pylori.  Her u/a was negative.    A repeat CT scan with contrast was obtained on 10/05/16.   A new segment of bowel wall thickening involving transverse colon was concerning for early ischemic colitis. It was revealed that she had a chronic obstruction of the SMA.  It was felt this was non infectious.  Her abx were discontinued and her INR was left to drift down to undergo a colonoscopy, which was on 10/09/16.  The results were overall picture suggestive of ischemic changes involving the IT an proximal colon (i.e. SMA distribution) although some distal changes may be present.  GI observations and recommendations as follows:  The ischemic tissue did not look frankly gangrenous although it was somewhat violaceous in character, consistent with more severe ischemia.  Recommendations: Would consider follow-up CT, perhaps with CT angiography, as well as repeat labs and close clinical monitoring to help discern whether or not surgical intervention is needed.  By 11/17, she continued to have worsening pain.  She had been restarted on IV abx and had 2 small bloody bowel movements.   She was going to the operating room, however, her clinical picture improved and this was put  on hold.  She was placed on bowel rest.  The next day her pain was much better.  She was given sips of clear liquids.  Her leukocytosis was improving.  She is now tolerating po's, however, she continues to have nausea after eating.    She states that she has not ever had these sx before she presented to [facility].  She continues to have some epigastric pain after eating and does complain of pain in the RLQ.   She has not had any further vomiting.  She has a hx of MI, but doesn't recall when.  She has a hx of a left main stent placement in  2011.  She has a decreased EF of 40%.   She has not had any recent chest pain.   She states she has had a stroke in the past at which time she knew what she wanted to say, but could not form the words and again, she cannot recall when this happened.  She has not had any more sx.   She is on a beta blocker.    She states she has not smoked in 50 days.        Past Medical History:  Diagnosis Date  . Anxiety   . Arthritis   . Asthma   . Cardiomyopathy (HCC)   . Chronic back pain   . COPD (chronic obstructive pulmonary disease) (HCC)   . Essential hypertension   . Fibromyalgia   . History of TIA (transient ischemic attack)   . Hyperlipidemia   . Migraine    Recurrent  . Myocardial infarction, anterior wall (HCC) 2011   Late reperfusion occluded LAD treated with BMS complicated by catheter-induced left mainstem dissection also treated with BMS  . Panic attacks   . Rheumatic heart disease    St. Jude MVR 2007  . Spina bifida [facility])     Past Surgical History:  Procedure Laterality Date  . APPENDECTOMY    . CARDIAC CATHETERIZATION  06/2005; 11/2005   Hattie Perch 04/08/2011   . CHOLECYSTECTOMY N/A 09/08/2016   Procedure: LAPAROSCOPIC CHOLECYSTECTOMY WITH INTRAOPERATIVE CHOLANGIOGRAM;  Surgeon: Ovidio Kin, MD;  Location: [facility] OR;  Service: General;  Laterality: N/A;  . COLONOSCOPY WITH PROPOFOL N/A 10/09/2016   Procedure: COLONOSCOPY WITH PROPOFOL;  Surgeon: Bernette Redbird, MD;  Location[facility] ENDOSCOPY;  Service: Endoscopy;  Laterality: N/A;  . CORONARY ANGIOPLASTY WITH STENT PLACEMENT  06/2010   BMS to the mLAD (culprit vessel) and L main (which dissected during the procedure), non-obs dz CFX & RCA  . ESOPHAGOGASTRODUODENOSCOPY Left 09/30/2016   Procedure: ESOPHAGOGASTRODUODENOSCOPY (EGD);  Surgeon: Dorena Cookey, MD;  Location: [facility] ENDOSCOPY;  Service: Endoscopy;  Laterality: Left;  . MITRAL VALVE REPLACEMENT  02/2006   25 mm St. Jude mechanical prosthesis  . TUBAL LIGATION      Allergies    Allergen Reactions  . Cyclobenzaprine Hcl Nausea And Vomiting  . Latex Other (See Comments)    Unknown    Prior to Admission medications   Medication Sig Start Date End Date Taking? Authorizing Provider  carvedilol (COREG) 6.25 MG tablet Take 1 tablet (6.25 mg total) by mouth 2 (two) times daily with a meal. 09/12/16  Yes Shanker Levora Dredge, MD  HYDROcodone-acetaminophen (NORCO/VICODIN) 5-325 MG tablet Take 1 tablet by mouth 3 (three) times daily as needed for moderate pain. 09/12/16  Yes Shanker Levora Dredge, MD  Melatonin 3 MG CAPS Take 3 mg by mouth at bedtime as needed (for sleep).   Yes Historical  Provider, MD  promethazine (PHENERGAN) 25 MG tablet Take 25 mg by mouth 2 (two) times daily as needed for nausea or vomiting.  02/26/12  Yes Historical Provider, MD  warfarin (COUMADIN) 5 MG tablet Take 2.5 mg once on 10/21 Start taking your usual regimen on 10/22 (7.5 mg by mouth on Monday and Thursday. Take 10mg  by mouth on all other days) Go to Dr Bonnita Levan office on 10/23 for INR check-and ask Dr Sherril Croon for further dosing instructions 09/12/16  Yes Shanker Levora Dredge, MD  NITROSTAT 0.4 MG SL tablet DISSOLVE ONE TABLET UNDER TONGUE EVERY 5 MINUTES UP TO 3 DOSES AS NEEDED FOR CHEST PAIN Patient taking differently: DISSOLVE 0.4 MG UNDER TONGUE EVERY 5 MINUTES UP TO 3 DOSES AS NEEDED FOR CHEST PAIN 03/22/15   Jonelle Sidle, MD    Social History   Social History  . Marital status: Divorced    Spouse name: N/A  . Number of children: N/A  . Years of education: N/A   Occupational History  . Disabled    Social History Main Topics  . Smoking status: Former Smoker    Packs/day: 0.25    Years: 38.00    Types: Cigarettes    Quit date: 08/28/2016  . Smokeless tobacco: Never Used  . Alcohol use Yes     Comment: 09/03/2016 "drank some; quit after my 1st child was born in 34"  . Drug use: No  . Sexual activity: Not Currently    Birth control/ protection: Post-menopausal   Other Topics Concern   . Not on file   Social History Narrative   Lives in [place] with a boyfriend. She is currently disabled.      Family History  Problem Relation Age of Onset  . Heart attack Father 76  . Heart attack Mother 88  . Coronary artery disease Sister     ROS: [x]  Positive   [ ]  Negative   [ ]  All sytems reviewed and are negative  Cardiovascular: []  chest pain/pressure []  palpitations []  SOB lying flat []  DOE []  pain in legs while walking []  pain in legs at rest []  pain in legs at night []  non-healing ulcers []  hx of DVT []  swelling in legs [x]  hx mechanical mitral valve (hx rheumatic heart disease)  Pulmonary: []  productive cough [x]  asthma/wheezing []  home O2  Neurologic: []  weakness in []  arms []  legs []  numbness in []  arms []  legs []  hx of CVA [x]  mini stroke [x] difficulty speaking or slurred speech-in the past []  temporary loss of vision in one eye []  dizziness [x]  hx migraines  Hematologic: []  hx of cancer []  bleeding problems []  problems with blood clotting easily  Endocrine:   []  diabetes []  thyroid disease  GI []  vomiting blood []  blood in stool [x]  abdominal pain, N/V  GU: []  CKD/renal failure []  HD--[]  M/W/F or []  T/T/S []  burning with urination []  blood in urine  Psychiatric: [x]  anxiety []  depression  Musculoskeletal: [x]  arthritis []  joint pain [x]  chronic back pain  Integumentary: []  rashes []  ulcers  Constitutional: [x]  fever []  chills   Physical Examination  Vitals:   10/12/16 2037 10/13/16 0729  BP: (!) 105/55 (!) 115/58  Pulse: 69 75  Resp:    Temp: 99.1 F (37.3 C) 98.8 F (37.1 C)   Body mass index is 23.89 kg/m.  General:  WDWN in NAD Gait: Not observed HENT: WNL, normocephalic Pulmonary: normal non-labored breathing, without Rales, rhonchi,  wheezing Cardiac: regular, without  Murmurs, rubs or  gallops; without carotid bruits Abdomen:  Soft, tender to palpation in the RLQ; +BS Skin: without rashes Vascular  Exam/Pulses:  Right Left  Radial Unable to palpate  2+ (normal)  Ulnar Unable to palpate  Unable to palpate  Femoral 2+ (normal) 2+ (normal)  Popliteal Unable to palpate  Unable to palpate   DP 1+ (weak) 1+ (weak)  PT Unable to palpate  Unable to palpate    Extremities: without ischemic changes, without Gangrene , without cellulitis; without open wounds;  Musculoskeletal: no muscle wasting or atrophy  Neurologic: A&O X 3;  No focal weakness or paresthesias are detected; speech is fluent/normal Psychiatric:  The pt has flat affect. Lymph:  No inguinal lymphadenopathy   CBC    Component Value Date/Time   WBC 11.6 (H) 10/12/2016 0531   RBC 3.39 (L) 10/12/2016 0531   HGB 10.3 (L) 10/12/2016 0531   HCT 31.2 (L) 10/12/2016 0531   PLT 290 10/12/2016 0531   MCV 92.0 10/12/2016 0531   MCH 30.4 10/12/2016 0531   MCHC 33.0 10/12/2016 0531   RDW 13.8 10/12/2016 0531    BMET    Component Value Date/Time   NA 133 (L) 10/13/2016 0335   K 4.3 10/13/2016 0335   CL 104 10/13/2016 0335   CO2 21 (L) 10/13/2016 0335   GLUCOSE 97 10/13/2016 0335   BUN <5 (L) 10/13/2016 0335   CREATININE 0.75 10/13/2016 0335   CALCIUM 7.9 (L) 10/13/2016 0335   GFRNONAA >60 10/13/2016 0335   GFRAA >60 10/13/2016 0335    COAGS: Lab Results  Component Value Date   INR 2.03 10/13/2016   INR 2.30 10/12/2016   INR 2.09 10/11/2016     Non-Invasive Vascular Imaging:   CT abdomen/pelvis with contrast 10/05/16 IMPRESSION: 1. New segment of bowel wall thickening involving the transverse colon. Concern for mild / early ischemic colitis. 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 2. Small amount of fluid in the gallbladder fossa without evidence of progression.  Statin:  No. Beta Blocker:  Yes.   Aspirin:  No. ACEI:  No. ARB:  No. CCB use:  No Other antiplatelets/anticoagulants:  Yes.   Coumadin   ASSESSMENT/PLAN: This is a 55 y.o. female admitted with N/V and  abdominal pain   -she underwent laparoscopic cholecystectomy on 10/16 and she did get temporary relief, however, her N/V and abdominal pain returned 3 days after discharge and she was readmitted.  She continues to have nausea after eating.   She has been found to have ischemic colitis with an occluded segment of SMA of 1.7cm.  She also has a stenosis of the celiac but this is patent.  The IMA is patent. -she continues to be on a heparin gtt for her mechanical mitral valve and her coumadin is scheduled to be restarted today.  I have asked the RN to hold this until the pt is seen by Dr. Randie Heinzain as she may need angiogram to evaluate given she continues to have abdominal discomfort and nausea after eating.   -she did have an isolated episode of creatinine of 1.8 during this admission, but with IV hydration, this has resolved.  -she stopped smoking 50 days ago and I strongly encouraged her to keep this up! -Dr. Randie Heinzain will be by to see the pt later today.   Doreatha MassedSamantha Rhyne, PA-C [facility] [phone]  I have this patient with PA. I reviewed with the above findings were discussed with general surgery. She has been offered an  aortogram with possible SMA intervention at this time she wants to think about it. I asked her to be nothing by mouth at midnight and hold off on Coumadin continuing heparin drip. We can make a decision about intervention and I will be available to perform the procedure any open procedure on her would be of high risk given her cardiac history.  Brandon C. Randie Heinz, MD [facility] [place] Office: [phone] Pager: [phone]

## 2016-10-14 ENCOUNTER — Encounter (HOSPITAL_COMMUNITY): Payer: Self-pay | Admitting: *Deleted

## 2016-10-14 LAB — CBC
HEMATOCRIT: 32.2 % — AB (ref 36.0–46.0)
HEMOGLOBIN: 10.6 g/dL — AB (ref 12.0–15.0)
MCH: 30.6 pg (ref 26.0–34.0)
MCHC: 32.9 g/dL (ref 30.0–36.0)
MCV: 93.1 fL (ref 78.0–100.0)
Platelets: 280 10*3/uL (ref 150–400)
RBC: 3.46 MIL/uL — AB (ref 3.87–5.11)
RDW: 14.3 % (ref 11.5–15.5)
WBC: 11.4 10*3/uL — AB (ref 4.0–10.5)

## 2016-10-14 LAB — HEPARIN LEVEL (UNFRACTIONATED): HEPARIN UNFRACTIONATED: 0.52 [IU]/mL (ref 0.30–0.70)

## 2016-10-14 LAB — PROTIME-INR
INR: 1.92
PROTHROMBIN TIME: 22.3 s — AB (ref 11.4–15.2)

## 2016-10-14 LAB — SURGICAL PCR SCREEN
MRSA, PCR: NEGATIVE
STAPHYLOCOCCUS AUREUS: NEGATIVE

## 2016-10-14 MED ORDER — WARFARIN SODIUM 5 MG PO TABS
10.0000 mg | ORAL_TABLET | Freq: Once | ORAL | Status: AC
  Administered 2016-10-14: 10 mg via ORAL
  Filled 2016-10-14: qty 2

## 2016-10-14 MED ORDER — WARFARIN - PHARMACIST DOSING INPATIENT
Freq: Every day | Status: DC
  Administered 2016-10-14: 18:00:00

## 2016-10-14 MED ORDER — FENTANYL CITRATE (PF) 100 MCG/2ML IJ SOLN
12.5000 ug | Freq: Four times a day (QID) | INTRAMUSCULAR | Status: DC | PRN
  Administered 2016-10-15 – 2016-10-16 (×4): 12.5 ug via INTRAVENOUS
  Filled 2016-10-14 (×4): qty 2

## 2016-10-14 MED ORDER — OXYCODONE HCL 5 MG PO TABS
5.0000 mg | ORAL_TABLET | Freq: Four times a day (QID) | ORAL | Status: DC | PRN
  Administered 2016-10-14 – 2016-10-16 (×9): 10 mg via ORAL
  Filled 2016-10-14 (×9): qty 2

## 2016-10-14 NOTE — Progress Notes (Addendum)
ANTICOAGULATION CONSULT NOTE - Follow Up Consult  Pharmacy Consult for heparin, resume Coumadin Indication: acute mesenteric ischemia  Allergies  Allergen Reactions  . Cyclobenzaprine Hcl Nausea And Vomiting  . Latex Other (See Comments)    Unknown    Patient Measurements: Height: 5\' 4"  (162.6 cm) Weight: 139 lb 3.2 oz (63.1 kg) IBW/kg (Calculated) : 54.7  Vital Signs: Temp: 100 F (37.8 C) (11/21 0700) Temp Source: Oral (11/21 0700) BP: 109/55 (11/21 0700) Pulse Rate: 77 (11/21 0700)  Labs:  Recent Labs  10/12/16 0531 10/13/16 0332 10/13/16 0335 10/14/16 0408  HGB 10.3*  --   --  10.6*  HCT 31.2*  --   --  32.2*  PLT 290  --   --  280  LABPROT 25.7* 23.3*  --  22.3*  INR 2.30 2.03  --  1.92  HEPARINUNFRC 0.42 0.64  --  0.52  CREATININE 0.76  --  0.75  --     Estimated Creatinine Clearance: 69.4 mL/min (by C-G formula based on SCr of 0.75 mg/dL).   Assessment: 55yo on heparin bridge while off Coumadin, now to resume Coumadin.  Heparin level is therapeutic on current rate.  Hg is stable and pltc wnl.  No bleeding.  Goal of Therapy:  Heparin level 0.3-0.7 units/ml Monitor platelets by anticoagulation protocol: Yes   Plan:  Continue heparin at current rate Coumadin 10mg  po today Daily HL, CBC, INR Watch for s/s of bleeding  Marisue HumbleKendra Kimori Tartaglia, PharmD Clinical Pharmacist [facility]

## 2016-10-14 NOTE — Progress Notes (Signed)
RN spoke with MD Randie Heinzain who said patient refused procedure and will not be getting operation today, Rn to resume previous diet. Will continue to monitor patient.

## 2016-10-14 NOTE — Progress Notes (Signed)
  Progress Note    10/14/2016 12:17 PM 5 Days Post-Op  Subjective: feeling ok  Vitals:   10/13/16 2056 10/14/16 0700  BP: (!) 116/52 (!) 109/55  Pulse: 72 77  Resp: 18 16  Temp: 99.1 F (37.3 C) 100 F (37.8 C)    Physical Exam: aaox3 Abdomen is soft  CBC    Component Value Date/Time   WBC 11.4 (H) 10/14/2016 0408   RBC 3.46 (L) 10/14/2016 0408   HGB 10.6 (L) 10/14/2016 0408   HCT 32.2 (L) 10/14/2016 0408   PLT 280 10/14/2016 0408   MCV 93.1 10/14/2016 0408   MCH 30.6 10/14/2016 0408   MCHC 32.9 10/14/2016 0408   RDW 14.3 10/14/2016 0408    BMET    Component Value Date/Time   NA 133 (L) 10/13/2016 0335   K 4.3 10/13/2016 0335   CL 104 10/13/2016 0335   CO2 21 (L) 10/13/2016 0335   GLUCOSE 97 10/13/2016 0335   BUN <5 (L) 10/13/2016 0335   CREATININE 0.75 10/13/2016 0335   CALCIUM 7.9 (L) 10/13/2016 0335   GFRNONAA >60 10/13/2016 0335   GFRAA >60 10/13/2016 0335    INR    Component Value Date/Time   INR 1.92 10/14/2016 0408     Intake/Output Summary (Last 24 hours) at 10/14/16 1217 Last data filed at 10/14/16 0900  Gross per 24 hour  Intake              748 ml  Output                0 ml  Net              748 ml     Assessment:  55 y.o. female is here s/p cholecystectomy now s/p ir drainage of fluid collection with occluded sma, feeling ok at this point  Plan: Patient does not want attempt at endovascular revascularization of sma at this time Would be high risk for open revascularization given cardiac history Check baseline mesenteric duplex rec baby aspirin along with anticoagulation if not otherwise contraindicated Can f/u prn   Jovin Fester C. Randie Heinzain, MD [facility] [place] Office: [phone] Pager: [phone]  10/14/2016 12:17 PM

## 2016-10-14 NOTE — Progress Notes (Signed)
Patient ID: [name], female   DOB: 18-Aug-1961, 55 y.o.   MRN: [id number]  [facility]  5 Days Post-Op  Subjective: Complaining "stomach turning" this morning. She has not eaten today due to possibility of procedure, but patient has declined any vascular intervention therefore just restarted on soft diet.  Objective: Vital signs in last 24 hours: Temp:  [98.7 F (37.1 C)-100 F (37.8 C)] 100 F (37.8 C) (11/21 0700) Pulse Rate:  [65-77] 77 (11/21 0700) Resp:  [16-18] 16 (11/21 0700) BP: (109-116)/(52-57) 109/55 (11/21 0700) SpO2:  [96 %-98 %] 96 % (11/21 0700) Last BM Date: 10/13/16  Intake/Output from previous day: 11/20 0701 - 11/21 0700 In: 1468 [P.O.:960; I.V.:258; IV Piggyback:250] Out: -  Intake/Output this shift: No intake/output data recorded.  PE: Gen:  Alert, NAD, depressed affect Pulm:  CTAB, effort normal Abd: Soft, NT/ND, +BS, no HSM   Lab Results:   Recent Labs  10/12/16 0531 10/14/16 0408  WBC 11.6* 11.4*  HGB 10.3* 10.6*  HCT 31.2* 32.2*  PLT 290 280   BMET  Recent Labs  10/12/16 0531 10/13/16 0335  NA 132* 133*  K 3.3* 4.3  CL 101 104  CO2 21* 21*  GLUCOSE 112* 97  BUN 7 <5*  CREATININE 0.76 0.75  CALCIUM 7.8* 7.9*   PT/INR  Recent Labs  10/13/16 0332 10/14/16 0408  LABPROT 23.3* 22.3*  INR 2.03 1.92   CMP     Component Value Date/Time   NA 133 (L) 10/13/2016 0335   K 4.3 10/13/2016 0335   CL 104 10/13/2016 0335   CO2 21 (L) 10/13/2016 0335   GLUCOSE 97 10/13/2016 0335   BUN <5 (L) 10/13/2016 0335   CREATININE 0.75 10/13/2016 0335   CALCIUM 7.9 (L) 10/13/2016 0335   PROT 6.7 10/05/2016 0313   ALBUMIN 2.7 (L) 10/05/2016 0313   AST 27 10/05/2016 0313   ALT 23 10/05/2016 0313   ALKPHOS 98 10/05/2016 0313   BILITOT 0.6 10/05/2016 0313   GFRNONAA >60 10/13/2016 0335   GFRAA >60 10/13/2016 0335   Lipase  No results found for: LIPASE     Studies/Results: No results  found.  Anti-infectives: Anti-infectives    Start     Dose/Rate Route Frequency Ordered Stop   10/09/16 1630  piperacillin-tazobactam (ZOSYN) IVPB 3.375 g     3.375 g 12.5 mL/hr over 240 Minutes Intravenous Every 8 hours 10/09/16 1629     09/30/16 1300  amoxicillin (AMOXIL) capsule 500 mg  Status:  Discontinued     500 mg Oral Every 12 hours 09/30/16 1236 10/07/16 1053   09/30/16 1300  clarithromycin (BIAXIN) tablet 500 mg  Status:  Discontinued     500 mg Oral Every 12 hours 09/30/16 1236 10/07/16 1053   09/15/16 2230  ciprofloxacin (CIPRO) IVPB 400 mg  Status:  Discontinued     400 mg 200 mL/hr over 60 Minutes Intravenous Every 12 hours 09/15/16 2215 09/17/16 1249       Assessment/Plan S/p laparoscopic cholecystectomy10/16/17, Dr. Ezzard StandingNewman EGD 09/30/16: La grade C reflux esophagitis and duodenitis H Pylori is positive antibody-amoxicillin and Biaxin have been discontinued due to nausea. Ischemic colitis.  - Colonoscopy 11/16: severe ischemic changes from the terminal ileum to roughly the midportion of the transverse colon; more minor, possible ischemic changes in the sigmoid region - biopsy: HEMORRHAGIC NECROSIS - per GI: Continue antibiotics for total 14 days for severe ischemic colitis; Patient may benefit from vascular surgery evaluation; Advance diet slowly  as tolerated; Follow-up with Dr. Matthias HughsBuccini in 3-4 weeks after discharge  SMA occlusion. Celiac stenosis.  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 Continue soft diet. Coumadin per pharmacy. Continue antibiotics x14 days for severe ischemic colitis (day #6/14). Once INR therapeutic patient  will likely be ready for discharge. Try to decrease IV pain medication use.   LOS: 29 days    Edson SnowballBROOKE A MILLER , [facility] LPA-C [facility] 10/14/2016, 11:13 AM Pager: [phone] Consults: [phone] Mon-Fri 7:00 am-4:30 pm Sat-Sun 7:00 am-11:30 am

## 2016-10-14 NOTE — Progress Notes (Signed)
Spoke with PA Will, PA wants RN to resume patients GI soft diet.

## 2016-10-15 ENCOUNTER — Inpatient Hospital Stay (HOSPITAL_COMMUNITY): Payer: Medicaid Other

## 2016-10-15 DIAGNOSIS — T45515A Adverse effect of anticoagulants, initial encounter: Secondary | ICD-10-CM

## 2016-10-15 LAB — PROTIME-INR
INR: 2.19
Prothrombin Time: 24.7 seconds — ABNORMAL HIGH (ref 11.4–15.2)

## 2016-10-15 LAB — CBC
HCT: 34.4 % — ABNORMAL LOW (ref 36.0–46.0)
Hemoglobin: 11.7 g/dL — ABNORMAL LOW (ref 12.0–15.0)
MCH: 31.7 pg (ref 26.0–34.0)
MCHC: 34 g/dL (ref 30.0–36.0)
MCV: 93.2 fL (ref 78.0–100.0)
PLATELETS: 279 10*3/uL (ref 150–400)
RBC: 3.69 MIL/uL — ABNORMAL LOW (ref 3.87–5.11)
RDW: 14.4 % (ref 11.5–15.5)
WBC: 9.3 10*3/uL (ref 4.0–10.5)

## 2016-10-15 LAB — HEPARIN LEVEL (UNFRACTIONATED): HEPARIN UNFRACTIONATED: 0.61 [IU]/mL (ref 0.30–0.70)

## 2016-10-15 MED ORDER — LACTATED RINGERS IV BOLUS (SEPSIS)
1000.0000 mL | Freq: Once | INTRAVENOUS | Status: AC
  Administered 2016-10-15: 1000 mL via INTRAVENOUS

## 2016-10-15 MED ORDER — WARFARIN SODIUM 5 MG PO TABS
10.0000 mg | ORAL_TABLET | Freq: Once | ORAL | Status: AC
Start: 2016-10-15 — End: 2016-10-15
  Administered 2016-10-15: 10 mg via ORAL
  Filled 2016-10-15: qty 2

## 2016-10-15 NOTE — Progress Notes (Signed)
[facility]

## 2016-10-15 NOTE — Progress Notes (Signed)
ANTICOAGULATION CONSULT NOTE - Follow Up Consult  Pharmacy Consult for heparin / Coumadin Indication: acute mesenteric ischemia / MVR  Allergies  Allergen Reactions  . Cyclobenzaprine Hcl Nausea And Vomiting  . Latex Other (See Comments)    Unknown    Patient Measurements: Height: 5\' 4"  (162.6 cm) Weight: 139 lb 3.2 oz (63.1 kg) IBW/kg (Calculated) : 54.7  Vital Signs: Temp: 98.7 F (37.1 C) (11/22 0530) Temp Source: Oral (11/22 0530) BP: 95/44 (11/22 0530) Pulse Rate: 62 (11/22 0530)  Labs:  Recent Labs  10/13/16 0332 10/13/16 0335 10/14/16 0408 10/15/16 0726  HGB  --   --  10.6* 11.7*  HCT  --   --  32.2* 34.4*  PLT  --   --  280 279  LABPROT 23.3*  --  22.3* 24.7*  INR 2.03  --  1.92 2.19  HEPARINUNFRC 0.64  --  0.52 0.61  CREATININE  --  0.75  --   --     Estimated Creatinine Clearance: 69.4 mL/min (by C-G formula based on SCr of 0.75 mg/dL).   Assessment: 55yo on Coumadin 10mg  daily exc for 7.5mg  on Mon/Thurs PTA for hx MVR (2.5-3.5) Now s/p colonoscopy to start heparin for acute mesenteric ischemia. SMA occlusion, celiac stenosis, if acute ischemic episode improves would involve vasc sx. Pt doesn't want endovasc revascularization & is high risk for open revasc. Coumadin restarted on 11/21. INR was down to 1.92 yesterday after being off Coumadin for almost a week. INR this am is 2.19. HL this am is therapeutic at 0.61. Hgb 11.7, plts wnl. No s/s of bleed.  Goal of Therapy:  Heparin level 0.3-0.7 units/ml  INR 2.5-3.5 Monitor platelets by anticoagulation protocol: Yes   Plan:  Continue heparin gtt at 1150 units/hr Give Coumadin 10mg  PO x 1 tonight Monitor daily HL / INR, CBC, s/s of bleed  Enzo BiNathan Catalina Salasar, PharmD, BCPS Clinical Pharmacist Pager [phone] 10/15/2016 10:10 AM

## 2016-10-15 NOTE — Care Management Note (Signed)
Case Management Note  Patient Details  Name: Brooke BoschMarlana W Alessandrini MRN: [id number] Date of Birth: 17-Nov-1961  Subjective/Objective:    Ischemic colitis, SMA occulusion                 Action/Plan: Discharge Planning: NCM spoke to pt and offered choice for HH/list provided. [facility]. [facility] with [facility]. Waiting final recommendations for home. Dtr states she has two daughters at home to assist with IV abx.   PCP Doreen BeamVYAS, DHRUV B MD  Expected Discharge Date:                  Expected Discharge Plan:  Home w Home Health Services  In-House Referral:  NA  Discharge planning Services  CM Consult  Post Acute Care Choice:  Home Health Choice offered to:  Patient  DME Arranged:  N/A DME Agency:  NA  HH Arranged:  RN, IV Antibiotics HH Agency:  [facility]  Status of Service:  In process, will continue to follow  If discussed at Long Length of Stay Meetings, dates discussed:    Additional Comments:  Elliot CousinShavis, Luvenia Cranford Ellen, RN 10/15/2016, 3:39 PM

## 2016-10-15 NOTE — Progress Notes (Signed)
Pt transported off unit to radiology for procedure. P. Amo Clarence Cogswell RN 

## 2016-10-15 NOTE — Progress Notes (Signed)
Preliminary results by tech -Mesenteric Duplex Completed. There appeared to be a patent celiac artery with abnormal stenotic waveforms and elevated velocities at the origin suggestive of greater than 70% stenosis. The SMA is a known occluded vessel. The IMA appeared patent with a significant stenosis at the origin. It is noted that this area was technically difficult to Doppler due to bend at the take-off.  [name], BS, RDMS, RVT

## 2016-10-15 NOTE — Progress Notes (Signed)
   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  She should antiplatelet if not otherwise contraindicated Please call with questions.   Cataleyah Colborn C. Randie Heinzain, MD [facility] Office: [phone] Pager: [phone]

## 2016-10-15 NOTE — Progress Notes (Signed)
Nutrition Follow-up  DOCUMENTATION CODES:   Not applicable  INTERVENTION:  Continue Boost Breeze po TID, each supplement provides 250 kcal and 9 grams of protein.  Continue 30 ml Prostat po TID, each supplement provides 100 kcal and 15 grams of protein.   Encourage adequate PO intake.   NUTRITION DIAGNOSIS:   Inadequate oral intake related to poor appetite as evidenced by meal completion < 50%; improved  GOAL:   Patient will meet greater than or equal to 90% of their needs; progressing  MONITOR:   PO intake, Supplement acceptance, Labs, Weight trends, Skin, I & O's  REASON FOR ASSESSMENT:   Malnutrition Screening Tool    ASSESSMENT:   55 y.o. 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 Ezzard StandingNewman, discharged on 10/20.  Readmitted from ED on 10/23 for [facility], indicating possible post-op complications of bile leak vs infection.  CT abd on 10/27 showed fluid/gas collection in GB fossa indicating possible infection. Percutaneous drain placed in subhepatic gallbladder fossa fluid collection 10/29. Colonoscopy 11/16: severe ischemic changes from the terminal ileum to roughly the midportion of the transverse colon; more minor, possible ischemic changes in the sigmoid region  Diet advanced this AM. Meal completion at lunch was 25%. Pt currently has Boost Breeze and Prostat ordered with varied consumption. RD to continue with current orders to aid in caloric and protein needs. Pt's appetite has been improving. Per MD note, pt consumed 50% at lunch and dinner yesterday. Pt encouraged to eat her food at meals and to consume her supplements.   Diet Order:  Diet regular Room service appropriate? Yes; Fluid consistency: Thin  Skin:   (Incision on  abdomen)  Last BM:  11/20  Height:   Ht Readings from Last 1 Encounters:  09/30/16 5\' 4"  (1.626 m)    Weight:   Wt Readings from Last 1 Encounters:  10/12/16 139 lb 3.2 oz (63.1 kg)    Ideal Body Weight:  54.5 kg  BMI:  Body mass index is 23.89 kg/m.  Estimated Nutritional Needs:   Kcal:  1800-1900  Protein:  75-85 grams  Fluid:  1.8 - 1.9 L/day  EDUCATION NEEDS:   No education needs identified at this time  Roslyn SmilingStephanie Yogesh Cominsky, MS, RD, LDN Pager # [phone] After hours/ weekend pager # [phone]

## 2016-10-15 NOTE — Progress Notes (Signed)
Pharmacy Antibiotic Note  Brooke BoschMarlana W Sweeney is a 55 y.o. female admitted on 09/15/2016 with colitis.  Pharmacy has been consulted for Zosyn dosing.  Day #7 of Zosyn for ischemic colitis. Surgery plans for 14 days. S/p tx for H.pylori. s/p Cipro for possible UTI. Afebrile, WBC wnl.  Plan: Continue zosyn 3.375 q8h 4 hr infusion  Monitor clinical picture, renal function F/U C&S, abx deescalation / LOT  Height: 5\' 4"  (162.6 cm) Weight: 139 lb 3.2 oz (63.1 kg) IBW/kg (Calculated) : 54.7  Temp (24hrs), Avg:99.4 F (37.4 C), Min:98.7 F (37.1 C), Max:100 F (37.8 C)   Recent Labs Lab 10/09/16 0504 10/09/16 1627 10/10/16 0620 10/11/16 0344 10/12/16 0531 10/13/16 0335 10/14/16 0408 10/15/16 0726  WBC 17.9* 22.6* 21.5* 18.7* 11.6*  --  11.4* 9.3  CREATININE 0.74  --  0.67 0.75 0.76 0.75  --   --   LATICACIDVEN  --  0.9 1.4  --   --   --   --   --     Estimated Creatinine Clearance: 69.4 mL/min (by C-G formula based on SCr of 0.75 mg/dL).    Allergies  Allergen Reactions  . Cyclobenzaprine Hcl Nausea And Vomiting  . Latex Other (See Comments)    Unknown    Antimicrobials this admission: Recent Zosyn, restart 11/16>> Cipro PTA >> 10/25 11/7 amoxicillin > 11/14 11/7 clarithro > 11/14  Dose adjustments this admission: n/a  Microbiology results: 10/25 Cdiff - negative 10/29 abd abscess: ngF 11/2 Blood cx: negF 11/2 Urine cx: mult species, recollect 11/10 H Pylori IgG: 2.8 11/13 cdiff: neg  Thank you for allowing pharmacy to be a part of this patient's care.  Armandina StammerBATCHELDER,Limmie Schoenberg J 10/15/2016 10:14 AM

## 2016-10-15 NOTE — Progress Notes (Signed)
Patient ID: Brooke BoschMarlana W Aceituno, female   DOB: 1961/02/22, 55 y.o.   MRN: [id number]  [facility]  6 Days Post-Op  Subjective: Denies any current abdominal pain. Appetite slowly improving, ate about 1/2 her lunch and dinner yesterday. Asking when she will be able to go home. States that she is ready.  Objective: Vital signs in last 24 hours: Temp:  [98.7 F (37.1 C)-100 F (37.8 C)] 98.8 F (37.1 C) (11/22 1013) Pulse Rate:  [61-78] 63 (11/22 1013) Resp:  [16-106] 20 (11/22 1013) BP: (85-112)/(44-52) 85/44 (11/22 1013) SpO2:  [96 %-98 %] 98 % (11/22 1013) Last BM Date: 10/13/16  Intake/Output from previous day: 11/21 0701 - 11/22 0700 In: 1688 [I.V.:1638; IV Piggyback:50] Out: 1 [Urine:1] Intake/Output this shift: No intake/output data recorded.  PE: Gen: Alert, NAD, depressed affect Pulm: CTAB, effort normal Abd: Soft, NT/ND, +BS, no HSM   Lab Results:   Recent Labs  10/14/16 0408 10/15/16 0726  WBC 11.4* 9.3  HGB 10.6* 11.7*  HCT 32.2* 34.4*  PLT 280 279   BMET  Recent Labs  10/13/16 0335  NA 133*  K 4.3  CL 104  CO2 21*  GLUCOSE 97  BUN <5*  CREATININE 0.75  CALCIUM 7.9*   PT/INR  Recent Labs  10/14/16 0408 10/15/16 0726  LABPROT 22.3* 24.7*  INR 1.92 2.19   CMP     Component Value Date/Time   NA 133 (L) 10/13/2016 0335   K 4.3 10/13/2016 0335   CL 104 10/13/2016 0335   CO2 21 (L) 10/13/2016 0335   GLUCOSE 97 10/13/2016 0335   BUN <5 (L) 10/13/2016 0335   CREATININE 0.75 10/13/2016 0335   CALCIUM 7.9 (L) 10/13/2016 0335   PROT 6.7 10/05/2016 0313   ALBUMIN 2.7 (L) 10/05/2016 0313   AST 27 10/05/2016 0313   ALT 23 10/05/2016 0313   ALKPHOS 98 10/05/2016 0313   BILITOT 0.6 10/05/2016 0313   GFRNONAA >60 10/13/2016 0335   GFRAA >60 10/13/2016 0335   Lipase  No results found for: LIPASE     Studies/Results: No results found.  Anti-infectives: Anti-infectives    Start     Dose/Rate Route Frequency  Ordered Stop   10/09/16 1630  piperacillin-tazobactam (ZOSYN) IVPB 3.375 g     3.375 g 12.5 mL/hr over 240 Minutes Intravenous Every 8 hours 10/09/16 1629     09/30/16 1300  amoxicillin (AMOXIL) capsule 500 mg  Status:  Discontinued     500 mg Oral Every 12 hours 09/30/16 1236 10/07/16 1053   09/30/16 1300  clarithromycin (BIAXIN) tablet 500 mg  Status:  Discontinued     500 mg Oral Every 12 hours 09/30/16 1236 10/07/16 1053   09/15/16 2230  ciprofloxacin (CIPRO) IVPB 400 mg  Status:  Discontinued     400 mg 200 mL/hr over 60 Minutes Intravenous Every 12 hours 09/15/16 2215 09/17/16 1249       Assessment/Plan S/p laparoscopic cholecystectomy10/16/17, Dr. Ezzard StandingNewman EGD 09/30/16: La grade C reflux esophagitis and duodenitis H Pylori is positive antibody-amoxicillin and Biaxin have been discontinued due to nausea. Ischemic colitis.  - Colonoscopy 11/16: severe ischemic changes from the terminal ileum to roughly the midportion of the transverse colon; more minor, possible ischemic changes in the sigmoid region - biopsy: HEMORRHAGIC NECROSIS - per GI: Continue antibiotics for total 14 days for severe ischemic colitis; Patient may benefit fromvascular surgery evaluation; Advance diet slowly as tolerated; Follow-up with Dr. Matthias HughsBuccini in 3-4 weeks after discharge  SMA occlusion. Celiac stenosis.  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 Advance to regular diet and encourage PO fluids. Coumadin per pharmacy, INR up today 2.19. Continue antibiotics x14 days for severe ischemic colitis (day #7/14). Once INR therapeutic patient will  likely be ready for discharge. Labs in AM    LOS: 30 days    Edson SnowballBROOKE A MILLER , [facility] 10/15/2016, 10:25 AM Pager: [phone] Consults: [phone] Mon-Fri 7:00 am-4:30 pm Sat-Sun 7:00 am-11:30 am

## 2016-10-16 LAB — BASIC METABOLIC PANEL
Anion gap: 9 (ref 5–15)
CALCIUM: 8 mg/dL — AB (ref 8.9–10.3)
CO2: 24 mmol/L (ref 22–32)
CREATININE: 0.91 mg/dL (ref 0.44–1.00)
Chloride: 99 mmol/L — ABNORMAL LOW (ref 101–111)
GFR calc Af Amer: >60 mL/min (ref >60)
Glucose, Bld: 105 mg/dL — ABNORMAL HIGH (ref 65–99)
POTASSIUM: 4.5 mmol/L (ref 3.5–5.1)
SODIUM: 132 mmol/L — AB (ref 135–145)

## 2016-10-16 LAB — PROTIME-INR
INR: 4.09 — AB
PROTHROMBIN TIME: 40.8 s — AB (ref 11.4–15.2)

## 2016-10-16 LAB — HEPARIN LEVEL (UNFRACTIONATED): Heparin Unfractionated: 0.41 IU/mL (ref 0.30–0.70)

## 2016-10-16 LAB — CBC
HCT: 29.2 % — ABNORMAL LOW (ref 36.0–46.0)
Hemoglobin: 9.7 g/dL — ABNORMAL LOW (ref 12.0–15.0)
MCH: 31.1 pg (ref 26.0–34.0)
MCHC: 33.2 g/dL (ref 30.0–36.0)
MCV: 93.6 fL (ref 78.0–100.0)
PLATELETS: 282 10*3/uL (ref 150–400)
RBC: 3.12 MIL/uL — AB (ref 3.87–5.11)
RDW: 14.5 % (ref 11.5–15.5)
WBC: 7.9 10*3/uL (ref 4.0–10.5)

## 2016-10-16 MED ORDER — CIPROFLOXACIN HCL 500 MG PO TABS: 500.0000 mg | ORAL_TABLET | Freq: Two times a day (BID) | ORAL | 0 refills | Status: AC

## 2016-10-16 MED ORDER — ONDANSETRON HCL 4 MG PO TABS: 4.0000 mg | ORAL_TABLET | Freq: Three times a day (TID) | ORAL | 0 refills | Status: AC | PRN

## 2016-10-16 MED ORDER — OXYCODONE HCL 5 MG PO TABS: 5.0000 mg | ORAL_TABLET | Freq: Four times a day (QID) | ORAL | 0 refills | Status: AC | PRN

## 2016-10-16 MED ORDER — METRONIDAZOLE 500 MG PO TABS: 500.0000 mg | ORAL_TABLET | Freq: Two times a day (BID) | ORAL | 0 refills | Status: AC

## 2016-10-16 NOTE — Progress Notes (Signed)
ANTICOAGULATION CONSULT NOTE - Follow Up Consult  Pharmacy Consult for heparin / Coumadin Indication: acute mesenteric ischemia / MVR  Allergies  Allergen Reactions  . Cyclobenzaprine Hcl Nausea And Vomiting  . Latex Other (See Comments)    Unknown    Patient Measurements: Height: 5\' 4"  (162.6 cm) Weight: 139 lb 3.2 oz (63.1 kg) IBW/kg (Calculated) : 54.7  Vital Signs: Temp: 99.3 F (37.4 C) (11/23 0452) Temp Source: Oral (11/23 0452) BP: 112/61 (11/23 0452) Pulse Rate: 66 (11/23 0452)  Labs:  Recent Labs  10/14/16 0408 10/15/16 0726 10/16/16 0425  HGB 10.6* 11.7* 9.7*  HCT 32.2* 34.4* 29.2*  PLT 280 279 282  LABPROT 22.3* 24.7* 40.8*  INR 1.92 2.19 4.09*  HEPARINUNFRC 0.52 0.61 0.41  CREATININE  --   --  0.91    Estimated Creatinine Clearance: 61 mL/min (by C-G formula based on SCr of 0.91 mg/dL).   Assessment: 55yo on Coumadin 10mg  daily exc for 7.5mg  on Mon/Thurs PTA for hx MVR (2.5-3.5) Now s/p colonoscopy to start heparin for acute mesenteric ischemia. SMA occlusion, celiac stenosis, if acute ischemic episode improves would involve vasc sx. Pt doesn't want endovasc revascularization & is high risk for open revasc. Coumadin restarted on 11/21. INR was down to 1.92 and then Coumadin restarted after being off for almost a week. After only 2 doses INR is up to 4.09. Hgb down to 9.7, plts wnl. No s/s of bleed.  Goal of Therapy:  INR 2.5-3.5 Monitor platelets by anticoagulation protocol: Yes   Plan:  Hold Coumadin dose tonight Monitor daily HL / INR, CBC, s/s of bleed  Enzo BiNathan Christen Bedoya, PharmD, [facility] Clinical Pharmacist Pager [phone] 10/16/2016 9:17 AM

## 2016-10-16 NOTE — Progress Notes (Signed)
[facility]   [facility]  [facility] and [facility]fusion Pharmacy services for home IV ABX- Zosyn 3.375 Grams IV Q 8 hours upon DC to home.  Teaching in hospital with pt for independence at home with IV ABX.  Awaiting PICC or Midline placement for home use as ordered by MD. [facility]16 for home IV ABX.  If patient discharges after hours, please call [phone].   Sedalia Mutaamela S Chandler 10/16/2016, 11:34 AM

## 2016-10-16 NOTE — Progress Notes (Signed)
CRITICAL VALUE ALERT  Critical value received:  INR 4.09 Date of notification:  10/16/16 Time of notification:  0552 Critical value read back:Yes.    Nurse who received alert:  Leandrew KoyanagiJoyce G. Roxan Hockeyobinson  MD notified (1st page):J. Lindie SpruceWyatt Time of first page: 0600 MD notified (2nd page):n/a  Time of second page:n/a  Responding MD:  Dr. Lindie SpruceWyatt Time MD responded:  [phone]

## 2016-10-16 NOTE — Progress Notes (Signed)
[facility]  Confirmed with [place], [place] with  CCS, pt will DC on po ABX. No HHRN or other HH needs.  [facility] will not be needed at this time.  If patient discharges after hours, please call [phone].   Sedalia Mutaamela S Chandler 10/16/2016, 11:56 AM

## 2016-10-16 NOTE — Progress Notes (Signed)
7 Days Post-Op  Subjective: No complaints INR 4.09 - heparin off Holding Coumadin today  Objective: Vital signs in last 24 hours: Temp:  [98 F (36.7 C)-99.3 F (37.4 C)] 99.3 F (37.4 C) (11/23 0452) Pulse Rate:  [63-94] 66 (11/23 0452) Resp:  [18-20] 18 (11/23 0452) BP: (85-122)/(38-62) 112/61 (11/23 0452) SpO2:  [95 %-98 %] 97 % (11/23 0452) Last BM Date: 10/15/16  Intake/Output from previous day: 11/22 0701 - 11/23 0700 In: 3576.7 [P.O.:240; I.V.:3186.7; IV Piggyback:150] Out: -  Intake/Output this shift: No intake/output data recorded. Gen: Alert, NAD, depressed affect Pulm: CTAB, effort normal Abd: Soft, NT/ND, +BS, no HSM Lab Results:   Recent Labs  10/15/16 0726 10/16/16 0425  WBC 9.3 7.9  HGB 11.7* 9.7*  HCT 34.4* 29.2*  PLT 279 282   BMET  Recent Labs  10/16/16 0425  NA 132*  K 4.5  CL 99*  CO2 24  GLUCOSE 105*  BUN <5*  CREATININE 0.91  CALCIUM 8.0*   PT/INR  Recent Labs  10/15/16 0726 10/16/16 0425  LABPROT 24.7* 40.8*  INR 2.19 4.09*   ABG No results for input(s): PHART, HCO3 in the last 72 hours.  Invalid input(s): PCO2, PO2  Studies/Results: No results found.  Anti-infectives: Anti-infectives    Start     Dose/Rate Route Frequency Ordered Stop   10/09/16 1630  piperacillin-tazobactam (ZOSYN) IVPB 3.375 g     3.375 g 12.5 mL/hr over 240 Minutes Intravenous Every 8 hours 10/09/16 1629     09/30/16 1300  amoxicillin (AMOXIL) capsule 500 mg  Status:  Discontinued     500 mg Oral Every 12 hours 09/30/16 1236 10/07/16 1053   09/30/16 1300  clarithromycin (BIAXIN) tablet 500 mg  Status:  Discontinued     500 mg Oral Every 12 hours 09/30/16 1236 10/07/16 1053   09/15/16 2230  ciprofloxacin (CIPRO) IVPB 400 mg  Status:  Discontinued     400 mg 200 mL/hr over 60 Minutes Intravenous Every 12 hours 09/15/16 2215 09/17/16 1249      Assessment/Plan: S/p laparoscopic cholecystectomy10/16/17, Dr. Ezzard StandingNewman EGD 09/30/16: La grade C  reflux esophagitis and duodenitis H Pylori is positive antibody-amoxicillin and Biaxin have been discontinued due to nausea. Ischemic colitis.  - Colonoscopy 11/16: severe ischemic changes from the terminal ileum to roughly the midportion of the transverse colon; more minor, possible ischemic changes in the sigmoid region - biopsy: HEMORRHAGIC NECROSIS - per GI: Continue antibiotics for total 14 days for severe ischemic colitis; Patient may benefit fromvascular surgery evaluation; Advance diet slowly as tolerated; Follow-up with Dr. Matthias HughsBuccini in 3-4 weeks after discharge  SMA occlusion. Celiac stenosis.  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 10/16/2016

## 2016-10-16 NOTE — Progress Notes (Signed)
Pt discharged to home with belongings, IV removed. Prescriptions and instructions given. Teach back performed. Pt stable at time of discharge.

## 2016-10-28 NOTE — Discharge Summary (Signed)
[facility] Discharge Summary   Patient ID: [name] MRN: [id number] DOB/AGE: 07/14/1961 55 y.o.  Admit date: 09/15/2016 Discharge date: 10/16/2016  Admitting Diagnosis: Abdominal pain Nausea Intra-abdominal infection Subhepatic fluid collection RUQ abdominal pain Leukocytosis  Discharge Diagnosis Patient Active Problem List   Diagnosis Date Noted  . H/O heart valve replacement with mechanical valve   . Intra-abdominal fluid collection s/p perc drain 10/29 09/25/2016  . Warfarin-induced coagulopathy (HCC) 09/25/2016  . Acute kidney injury (HCC) 09/25/2016  . Anemia 09/25/2016  . Intra-abdominal infection   . Subhepatic fluid collection   . Abdominal pain 09/15/2016  . CAD (coronary artery disease)   . Nausea with vomiting 09/04/2016  . Anxiety state 09/04/2016  . Tobacco abuse 09/04/2016  . Coronary artery disease involving native coronary artery of native heart without angina pectoris   . Mitral valve disease, rheumatic   . Acute cholecystitis s/p lap cholecystectomy 09/08/2016 09/03/2016  . CVA (cerebral infarction) 09/27/2012  . 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 Elevated WBC count 09/24/2012  . Blood glucose elevated 09/24/2012  . Chronic back pain   . Myocardial infarction, anterior wall (HCC)   . HYPERLIPIDEMIA, MIXED 08/27/2010  . Coronary atherosclerosis 08/27/2010  . Chronic systolic heart failure (HCC) 08/27/2010  . MITRAL VALVE REPLACEMENT, HX OF 08/27/2010    Consultants Brooke HarmanBrandon Christopher Cain MD, vascular Brooke Redbirdobert Buccini MD, GI Brooke CookeyJohn Hayes MD, GI Brooke Righterobert Comer MD, ID Brooke Flattenavid Merrell MD, internal medicine Brooke ClickArthur Hoss MD, IR Brooke Magicraci Turner MD, cardiology  Imaging: HIDA 09/16/16: No findings suspicious for bile leak.  CT abdomen pelvis w contrast 09/19/16: Continued fluid and gas collection within the gallbladder fossa concerning for infected postoperative fluid collection. This is slightly smaller when  compared to prior study. Diffuse fatty infiltration of the liver.  DG chest 2 view 09/24/16: No active cardiopulmonary disease.  DG Abd 2 view 09/25/16: Mild ileuswhich is predominantly right-sided. No free extraluminal gas collections.  CT abdomen pelvis wo contrast 09/25/16: 1. Interim placement of a pigtail drain within the right upper quadrant. Significant decreased fluid and gas collection at the gallbladder fossa with small residual gas bubbles noted. 2. Focal hypodensity near the falciform ligament, may represent focal fatty infiltration 3. Mild thickening of the pylorus and proximal duodenum could relate to a mild gastroduodenitis. 4. Few prominent loops of contrast filled small bowel an fluid-filled colon, findings could relate to an ileus. No evidence for high-grade bowel obstruction.  CT abdomen pelvis w contrast 10/05/16: 1. New segment of bowel wall thickening involving the transverse colon. Concern for mild / early ischemic colitis. 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 2. Small amount of fluid in the gallbladder fossa without evidence of progression.  Procedures Dr. Madilyn FiremanHayes (09/30/16) - EGD Dr. Matthias HughsBuccini (10/09/16) - Colonoscopy   Hospital Course:  Brooke BoschMarlana W Sweeney is a 55yo female who underwent laparoscopic cholecystectomy on 09/06/16. She was discharged 09/12/16 in good medical condition. Patient returned to ED 3 days later with worsening epigastric and RUQ abdominal pain, as well as nausea and vomiting. CXR, urine cultures, and blood cultures negative. CT scan showed a small gallbladder fossa fluid collection, but HIDA was negative for bile leak. She also had mild leukocytosis and was on cipro for 3 days during this admission, as well as zosyn x7 days previous admission. Patient began to have diarrhea, C. Diff negative. WBC resolved then recurred 09/20/16. Repeat CT scan performed 09/19/16 showed that the previously seen fluid collection was  slightly smaller. Percutaneous  drain was placed by IR in subhepatic gallbladder fossa; culture was negative. Unfortunately she had issues with persistent diarrhea, nausea, vomiting, and anorexia leading to dehydration. Her INR became supra-therapeutic therefore medicine was consulted. BP elevated and patient became tachycardic. Her WBC again rose and repeat CT was performed; CT showed gallbladder fluid collection drain in good position. Repeat CXR and blood cultures negative. Infectious disease was consulted due to leukocytosis/fever of unknown origin; origin was not found and patient was observed off antibiotics as objectively she started to improve. Subjectively patient continued to complain of epigastric pain, nausea, vomiting, and intermittent diarrhea. GI was consulted and recommended EGD; this showed moderately severe distal erosive esophagitis as well as a diffuse duodenitis with duodenal erosions. Patient was started on carafate and PPI, as well as treatment for H pylori. Initially had some improvement in her symptoms then WBC increased again and abdominal pain and diarrhea worsened. CT repeated 10/05/16 which showed transverse colon wall thickening and concern for early/mild ischemic colitis. C diff again negative. GI asked to see again and performed a colonoscopy; this showed severe ischemic changes from the terminal ileum to roughly the midportion of the transverse colon, possible ischemic changes in the sigmoid region, and small to moderate amount of fresh blood within the colonic lumen consistent with a hemorrhagic component to the ischemic changes; biopsies confirmed hemorrhagic necrosis; patient continued on antibiotics, heparin drip, and bowel rest and improved without surgical intervention. 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 Restarted coumadin 10/13/16. On 10/16/16 patient was voiding well, tolerating diet, ambulating well, pain well controlled, vital signs stable, and felt stable for discharge home.  Patient will follow-up with PCP next week to recheck INR, with Dr. Ezzard Standing in 2 weeks, GI in 3 weeks, and with a pain clinic.     Medication List    STOP taking these medications   HYDROcodone-acetaminophen 5-325 MG tablet Commonly known as:  NORCO/VICODIN   promethazine 25 MG tablet Commonly known as:  PHENERGAN     TAKE these medications   carvedilol 6.25 MG tablet Commonly known as:  COREG Take 1 tablet (6.25 mg total) by mouth 2 (two) times daily with a meal.   Melatonin 3 MG Caps Take 3 mg by mouth at bedtime as needed (for sleep).   NITROSTAT 0.4 MG SL tablet Generic drug:  nitroGLYCERIN DISSOLVE ONE TABLET UNDER TONGUE EVERY 5 MINUTES UP TO 3 DOSES AS NEEDED FOR CHEST PAIN What changed:  See the new instructions.   ondansetron 4 MG tablet Commonly known as:  ZOFRAN Take 1 tablet (4 mg total) by mouth every 8 (eight) hours as needed for nausea or vomiting.   oxyCODONE 5 MG immediate release tablet Commonly known as:  Oxy IR/ROXICODONE Take 1-2 tablets (5-10 mg total) by mouth every 6 (six) hours as needed for moderate pain.   warfarin 5 MG tablet Commonly known as:  COUMADIN Take 2.5 mg once on 10/21 Start taking your usual regimen on 10/22 (7.5 mg by mouth on Monday and Thursday. Take 10mg  by mouth on all other days) Go to Dr Bonnita Levan office on 10/23 for INR check-and ask Dr Sherril Croon for further dosing instructions     ASK your doctor about these medications   ciprofloxacin 500 MG tablet Commonly known as:  CIPRO Take 1 tablet (500 mg total) by  mouth 2 (two) times daily. Ask about: Should I take this medication?   metroNIDAZOLE 500 MG tablet Commonly known as:  FLAGYL Take 1 tablet (500 mg total) by mouth 2 (two) times daily. Ask about: Should I take this medication?         Follow-up Information    NEWMAN,DAVID H, MD Follow up.   Specialty:  General Surgery Why:  Call for follow up in 2 weeks. Contact information: [address] [place] [address] [phone]        Barrie FolkHAYES,JOHN C, MD Follow up.   Specialty:  Gastroenterology Why:  Call for follow up in 3 weeks for esophagitis and duodenitis. Contact information: [address] [place] [address] [phone]        Ignatius Speckinghruv B Vyas, MD Follow up.   Specialty:  Internal Medicine Why:  Call for follow up and to get your Warfarin level checked.  He will need to review your medicine and medical issues.   Contact information: [address] [place] [id number] [phone]        Call pain clinic for evaluation and treatment. Follow up.   Why:  We will not refill you pain medicine prescriptions.  You have to go to Dr. Sherril CroonVyas or the pain clinic.       Lemar LivingsBrandon Cain, MD. Call.   Specialties:  Vascular Surgery, Cardiology Why:  4-6 weeks  Contact information: [address] [place] [address] [phone]        [facility] Follow up.   Why:  Home Health RN Contact information: [address] [place] [address] [phone]           Signed: Edson SnowballBROOKE A MILLER, [facility] 10/28/2016, 11:16 AM Pager: [phone] Consults: [phone] Mon-Fri 7:00 am-4:30 pm Sat-Sun 7:00 am-11:30 am

## 2016-11-24 DEATH — deceased

## 2016-12-01 ENCOUNTER — Encounter: Payer: Self-pay | Admitting: Vascular Surgery

## 2016-12-05 ENCOUNTER — Ambulatory Visit: Payer: Medicaid Other | Admitting: Vascular Surgery

## 2016-12-08 ENCOUNTER — Telehealth: Payer: Self-pay | Admitting: Vascular Surgery

## 2016-12-08 NOTE — Telephone Encounter (Signed)
Brooke BucklerMarla Sweeney, dob: [birth year], passed away 11/03/2016. Her daughter Brooke KaufmannKeyra Sweeney informed us.
